# Patient Record
Sex: Male | Born: 1950 | Race: White | Hispanic: No | Marital: Married | Smoking: Never smoker
Health system: Southern US, Community
[De-identification: ages and names within clinical notes are randomized; demographics above are authoritative.]

## PROBLEM LIST (undated history)

## (undated) DIAGNOSIS — I499 Cardiac arrhythmia, unspecified: Secondary | ICD-10-CM

## (undated) DIAGNOSIS — E785 Hyperlipidemia, unspecified: Secondary | ICD-10-CM

## (undated) DIAGNOSIS — D369 Benign neoplasm, unspecified site: Secondary | ICD-10-CM

## (undated) DIAGNOSIS — E042 Nontoxic multinodular goiter: Secondary | ICD-10-CM

## (undated) DIAGNOSIS — K922 Gastrointestinal hemorrhage, unspecified: Secondary | ICD-10-CM

## (undated) DIAGNOSIS — E8881 Metabolic syndrome: Secondary | ICD-10-CM

## (undated) DIAGNOSIS — N400 Enlarged prostate without lower urinary tract symptoms: Secondary | ICD-10-CM

## (undated) DIAGNOSIS — I10 Essential (primary) hypertension: Secondary | ICD-10-CM

## (undated) HISTORY — DX: Benign neoplasm, unspecified site: D36.9

## (undated) HISTORY — DX: Hyperlipidemia, unspecified: E78.5

## (undated) HISTORY — PX: CATARACT EXTRACTION: SUR2

## (undated) HISTORY — DX: Metabolic syndrome: E88.810

## (undated) HISTORY — PX: EYE SURGERY: SHX253

## (undated) HISTORY — PX: KNEE CARTILAGE SURGERY: SHX688

## (undated) HISTORY — PX: THYROIDECTOMY: SHX17

## (undated) HISTORY — DX: Nontoxic multinodular goiter: E04.2

## (undated) HISTORY — DX: Metabolic syndrome: E88.81

## (undated) HISTORY — DX: Cardiac arrhythmia, unspecified: I49.9

## (undated) HISTORY — DX: Essential (primary) hypertension: I10

## (undated) HISTORY — DX: Gastrointestinal hemorrhage, unspecified: K92.2

## (undated) HISTORY — DX: Benign prostatic hyperplasia without lower urinary tract symptoms: N40.0

---

## 2003-03-29 ENCOUNTER — Ambulatory Visit (HOSPITAL_COMMUNITY): Admission: RE | Admit: 2003-03-29 | Discharge: 2003-03-29 | Payer: Self-pay | Admitting: Gastroenterology

## 2003-03-29 ENCOUNTER — Encounter (INDEPENDENT_AMBULATORY_CARE_PROVIDER_SITE_OTHER): Payer: Self-pay | Admitting: Specialist

## 2005-05-21 ENCOUNTER — Ambulatory Visit: Payer: Self-pay | Admitting: Cardiology

## 2005-05-29 ENCOUNTER — Ambulatory Visit: Payer: Self-pay

## 2005-08-14 ENCOUNTER — Encounter: Admission: RE | Admit: 2005-08-14 | Discharge: 2005-08-14 | Payer: Self-pay | Admitting: Family Medicine

## 2005-08-28 ENCOUNTER — Encounter: Admission: RE | Admit: 2005-08-28 | Discharge: 2005-08-28 | Payer: Self-pay | Admitting: General Surgery

## 2005-09-09 ENCOUNTER — Other Ambulatory Visit: Admission: RE | Admit: 2005-09-09 | Discharge: 2005-09-09 | Payer: Self-pay | Admitting: Interventional Radiology

## 2005-09-09 ENCOUNTER — Encounter: Admission: RE | Admit: 2005-09-09 | Discharge: 2005-09-09 | Payer: Self-pay | Admitting: General Surgery

## 2005-09-09 ENCOUNTER — Encounter (INDEPENDENT_AMBULATORY_CARE_PROVIDER_SITE_OTHER): Payer: Self-pay | Admitting: Specialist

## 2006-01-16 ENCOUNTER — Encounter: Admission: RE | Admit: 2006-01-16 | Discharge: 2006-01-16 | Payer: Self-pay | Admitting: General Surgery

## 2006-06-23 ENCOUNTER — Encounter: Admission: RE | Admit: 2006-06-23 | Discharge: 2006-06-23 | Payer: Self-pay | Admitting: General Surgery

## 2007-01-18 ENCOUNTER — Encounter: Admission: RE | Admit: 2007-01-18 | Discharge: 2007-01-18 | Payer: Self-pay | Admitting: General Surgery

## 2007-03-05 ENCOUNTER — Ambulatory Visit (HOSPITAL_COMMUNITY): Admission: RE | Admit: 2007-03-05 | Discharge: 2007-03-06 | Payer: Self-pay | Admitting: General Surgery

## 2007-03-05 ENCOUNTER — Encounter (INDEPENDENT_AMBULATORY_CARE_PROVIDER_SITE_OTHER): Payer: Self-pay | Admitting: General Surgery

## 2010-02-04 LAB — FECAL OCCULT BLOOD, GUAIAC: Fecal Occult Blood: NEGATIVE

## 2010-07-10 ENCOUNTER — Encounter: Payer: Self-pay | Admitting: Family Medicine

## 2010-07-10 DIAGNOSIS — S83206A Unspecified tear of unspecified meniscus, current injury, right knee, initial encounter: Secondary | ICD-10-CM | POA: Insufficient documentation

## 2010-08-27 NOTE — Op Note (Signed)
NAME:  Shawn Ochoa, Shawn Ochoa              ACCOUNT NO.:  [id number]   MEDICAL RECORD NO.:  [id number]          PATIENT TYPE:  AMB   LOCATION:  SDS                          FACILITY:  [facility]   PHYSICIAN:  Angelia Mould. Derrell Lolling, M.D.DATE OF BIRTH:  12-05-1950   DATE OF PROCEDURE:  03/05/2007  DATE OF DISCHARGE:                               OPERATIVE REPORT   PREOPERATIVE. DIAGNOSIS:  Multinodular goiter.   POSTOPERATIVE DIAGNOSIS:  Multinodular goiter.   OPERATION PERFORMED:  Total thyroidectomy.   SURGEON:  Angelia Mould. Derrell Lolling, M.D.   FIRST ASSISTANT:  Currie Paris, M.D.   OPERATIVE INDICATIONS:  This is a 60 year old white man who has had a  goiter for several years.  He has been worked up about 10 years ago, and  nothing further was done.  He has been on Synthroid in the past.  He has  no voice change or pressure symptoms but notices a large mass on the  left side.  He had an ultrasound last year which showed several nodules  within the thyroid gland bilaterally.  On the left, there was a 5.9-cm  nodule.  On the right, there was a nodular cystic component measuring  2.6.  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  He has been offered  thyroidectomy because of the large size of the left thyroid nodule.  He  has declined that for 18 months but it has been followed closely with  very little change.  He came back to see me last month stating that he  really wanted to go ahead and have this removed now.  He was brought to  the operating room electively.   OPERATIVE FINDINGS:  The left thyroid lobe was quite large.  It  contained a huge soft, smooth nodule in the mid to lower pole.  More  anteriorly and close to the isthmus was about a 2-cm very hard nodule.  The right lobe of the thyroid gland was mostly normal size and contained  smaller nodules.  I  did not feel any adenopathy anywhere.  On the right  side, I  questioned whether the patient had a nonrecurrent laryngeal  nerve.  We identified the inferior parathyroid glands on both sides.   OPERATIVE TECHNIQUE:  Following the induction of general endotracheal  anesthesia, the patient was placed in a reverse Trendelenburg position  with the neck extended.  The neck was prepped and draped in a sterile  fashion.  The patient was identified as to correct patient and correct  procedure.  Intravenous antibiotics were given.   A curved transverse incision was made about 2 cm above the suprasternal  notch.  Dissection was carried down through the subcutaneous tissue and  platysma muscle.  Skin and platysma flaps were raised superiorly and  inferiorly, and a self-retaining retractor was placed.  The strap  muscles were divided in the midline.  On the lower flap, one vein had to  be isolated, clamped, divided, and ligated with  3-0 silk ties.   We exposed the left thyroid lobe first and dissected the strap muscles  off of it.  In general, we kept the dissection in the capsule of the  thyroid gland to avoid any injury to nerves or parathyroid glands.  We  mobilized the lower pole by dividing vascular structures with Harmonic  scalpel and baby clips.  We mobilized the superior pole in two separate  sections by isolating the superior pole vessels, ligating them in  continuity, and then placing a metal clip above the superior tie for  extra security and then dividing this.  This mobilized the superior  pole, and we were able to bring it down off of the upper trachea without  much difficulty.  We then dissected from lateral to medial, very  carefully staying in the capsule of the thyroid gland.  It was a little  sticky in this area, but we were able to easily stay in the capsule and  stay away from the tracheoesophageal groove and stay away from the  recurrent laryngeal nerve.  We easily mobilized  this thyroid lobe up  onto the anterior aspect of the trachea.   I then exposed the right thyroid lobe in a similar fashion.  It was much  smaller and there was less vascular attachments, but they were also  controlled either with Harmonic scalpel or metal clips.  We took the  superior pole down in similar fashion, ligating the superior pole  vessels in continuity with 2-0 silk ties and also an extra metal clip  superiorly for security and dividing this.  We then carefully mobilized  the right lobe from lateral to medial.  We saw one structure that looked  like a small nerve coming down toward the junction between the thyroid  cartilage and the trachea, although this was smaller than the laryngeal  nerve, we were very careful to avoid injury to it in case it was a  nonrecurrent laryngeal nerve.  We mobilized the rest of the thyroid  gland off the trachea.  We marked the left superior pole with a suture.  We felt that we had clearly seen the inferior parathyroid glands on both  sides, and although we did not clearly see the parathyroid gland  superiorly, we felt that we stayed so close to the capsule that we most  likely did not injure them.   The specimen was passed off the field.  The wounds were irrigated with  saline.  We had a couple of small areas of bleeding that were controlled  with small metal clips or cautery.  We then irrigated this area and put  the Surgicel gauze on both sides and waited for about 5 minutes and then  noticed that the wound was completely dry without any blood.  We left  the Surgicel in place.  The strap muscles were closed with interrupted  sutures of 3-0 Vicryl.  The platysma muscle was closed with interrupted  sutures of 3-0 Vicryl and the skin closed with a running subcuticular  suture of 4-0 Monocryl and Steri-Strips.  Clean bandages were placed,  and the patient was taken to the recovery room in stable condition.   ESTIMATED BLOOD LOSS:  About 30 mL  or less.   COMPLICATIONS:  None.   COUNTS:  Sponge and instrument counts were correct.      Angelia Mould. Derrell Lolling, M.D.  Electronically Signed     HMI/MEDQ  D:  03/05/2007  T:  03/05/2007  Job:  [id number]   cc:   Ernestina Penna, M.D.

## 2010-08-30 NOTE — Op Note (Signed)
NAME:  Shawn Ochoa, Shawn Ochoa                   ACCOUNT NO.:  [id number]   MEDICAL RECORD NO.:  [id number]                   PATIENT TYPE:  AMB   LOCATION:  ENDO                                 FACILITY:  [facility]   PHYSICIAN:  John C. Madilyn Fireman, M.D.                 DATE OF BIRTH:  1950-07-24   DATE OF PROCEDURE:  03/29/2003  DATE OF DISCHARGE:                                 OPERATIVE REPORT   PROCEDURE:  Colonoscopy.   INDICATIONS FOR PROCEDURE:  Colon cancer screening in two second-degree  relatives.   DESCRIPTION OF PROCEDURE:  The patient was placed in the left lateral  decubitus position and placed on the pulse monitor with continuous low-flow  oxygen delivered by nasal cannula.  He was sedated with 75 mcg IV fentanyl  and 6 mg IV Versed.  The Olympus video colonoscope was inserted into the  rectum and advanced to the cecum, confirmed by transillumination of  McBurney's point and visualization of the ileocecal valve and appendiceal  orifice.  Prep was excellent.  The cecum appeared normal with no masses,  polyps, diverticula, or other mucosal abnormalities.  In the ascending  colon, there was an 8 mm sessile polyp which was fulgurated by snare.  The  remainder of the ascending, transverse, descending, sigmoid, and rectum  appeared normal with no further polyps, masses, diverticula, or other  mucosal abnormalities.  The rectum likewise appeared normal and retroflexed  view of the anus revealed no obvious internal hemorrhoids.  The colonoscope  was then withdrawn and the patient returned to the recovery room in stable  condition.  He tolerated the procedure well and there were no immediate  complications.   IMPRESSION:  Ascending colon polyp.   PLAN:  Await histology to determine method and interval for future colon  screening.                                               John C. Madilyn Fireman, M.D.    JCH/MEDQ  D:  03/29/2003  T:  03/29/2003  Job:  [id number]   cc:   Ernestina Penna,  M.D.  [address] [place]  [place] [address]  Fax: [phone]

## 2011-01-21 LAB — CBC
HCT: 41.5
MCHC: 33.9
MCV: 92.9
Platelets: 316
RDW: 13.1

## 2011-01-21 LAB — DIFFERENTIAL
Basophils Absolute: 0
Lymphocytes Relative: 36
Monocytes Absolute: 0.5
Monocytes Relative: 12
Neutro Abs: 1.9

## 2011-01-21 LAB — URINALYSIS, ROUTINE W REFLEX MICROSCOPIC
Bilirubin Urine: NEGATIVE
Glucose, UA: NEGATIVE
Hgb urine dipstick: NEGATIVE
Ketones, ur: NEGATIVE
Nitrite: NEGATIVE
Protein, ur: NEGATIVE
Specific Gravity, Urine: 1.021
Urobilinogen, UA: 1
pH: 7

## 2011-01-21 LAB — CALCIUM
Calcium: 8.8
Calcium: 8.9

## 2011-01-21 LAB — COMPREHENSIVE METABOLIC PANEL
Albumin: 3.5
BUN: 15
Calcium: 8.9
Creatinine, Ser: 0.93
Total Bilirubin: 1.3 — ABNORMAL HIGH
Total Protein: 6.9

## 2012-07-08 ENCOUNTER — Other Ambulatory Visit: Payer: Self-pay | Admitting: Family Medicine

## 2012-07-10 ENCOUNTER — Other Ambulatory Visit: Payer: Self-pay | Admitting: Family Medicine

## 2012-07-12 ENCOUNTER — Other Ambulatory Visit: Payer: Self-pay | Admitting: *Deleted

## 2012-07-12 MED ORDER — LEVOTHYROXINE SODIUM 75 MCG PO TABS: 75.0000 ug | ORAL_TABLET | Freq: Every day | ORAL | Status: DC

## 2012-07-12 MED ORDER — PIOGLITAZONE HCL 30 MG PO TABS: 30.0000 mg | ORAL_TABLET | Freq: Every day | ORAL | Status: DC

## 2012-07-12 MED ORDER — BENAZEPRIL HCL 20 MG PO TABS: 20.0000 mg | ORAL_TABLET | Freq: Every day | ORAL | Status: DC

## 2012-09-07 ENCOUNTER — Other Ambulatory Visit: Payer: Self-pay | Admitting: Family Medicine

## 2012-12-06 ENCOUNTER — Ambulatory Visit (INDEPENDENT_AMBULATORY_CARE_PROVIDER_SITE_OTHER): Payer: BC Managed Care – PPO | Admitting: Family Medicine

## 2012-12-06 ENCOUNTER — Encounter: Payer: Self-pay | Admitting: Family Medicine

## 2012-12-06 ENCOUNTER — Other Ambulatory Visit: Payer: Self-pay | Admitting: Family Medicine

## 2012-12-06 VITALS — BP 115/73 | HR 71 | Temp 96.8°F | Ht 70.0 in | Wt 214.0 lb

## 2012-12-06 DIAGNOSIS — E8881 Metabolic syndrome: Secondary | ICD-10-CM

## 2012-12-06 DIAGNOSIS — E785 Hyperlipidemia, unspecified: Secondary | ICD-10-CM

## 2012-12-06 DIAGNOSIS — E039 Hypothyroidism, unspecified: Secondary | ICD-10-CM | POA: Insufficient documentation

## 2012-12-06 DIAGNOSIS — R7309 Other abnormal glucose: Secondary | ICD-10-CM

## 2012-12-06 DIAGNOSIS — R739 Hyperglycemia, unspecified: Secondary | ICD-10-CM

## 2012-12-06 DIAGNOSIS — E559 Vitamin D deficiency, unspecified: Secondary | ICD-10-CM

## 2012-12-06 DIAGNOSIS — I10 Essential (primary) hypertension: Secondary | ICD-10-CM | POA: Insufficient documentation

## 2012-12-06 LAB — POCT CBC
Lymph, poc: 1.1 (ref 0.6–3.4)
MCH, POC: 30.5 pg (ref 27–31.2)
MCV: 90.6 fL (ref 80–97)
POC LYMPH PERCENT: 27.5 %L (ref 10–50)
Platelet Count, POC: 247 10*3/uL (ref 142–424)
RDW, POC: 13.6 %
WBC: 4.1 10*3/uL — AB (ref 4.6–10.2)

## 2012-12-06 LAB — POCT GLYCOSYLATED HEMOGLOBIN (HGB A1C): Hemoglobin A1C: 5.5

## 2012-12-06 NOTE — Patient Instructions (Addendum)
Fall precautions discussed Continue current meds and therapeutic lifestyle changes Don't forget to get your flu shot in October Continue regular exercise Watch sodium intake

## 2012-12-06 NOTE — Progress Notes (Signed)
Subjective:    Patient ID: Shawn Ochoa, male    DOB: [birth year], 62 y.o.   MRN: [id number]  HPI Patient returns to clinic today for followup and management of chronic medical problems. These include hypertension, hyperlipidemia, hypothyroidism, and in the past elevated blood sugar. Patient admits that he has not been as active physically due to personal circumstances at home with caring for his mother and with his working.  Also see the review of systems.   Review of Systems  Constitutional: Positive for fatigue (slight). Negative for activity change and appetite change.  HENT: Negative for ear pain, congestion, sore throat, sneezing, postnasal drip and sinus pressure.   Eyes: Positive for discharge (watering). Negative for redness, itching and visual disturbance.  Respiratory: Negative.  Negative for cough, choking, chest tightness, shortness of breath and wheezing.   Cardiovascular: Positive for leg swelling (occasional). Negative for chest pain and palpitations.  Gastrointestinal: Negative for nausea, vomiting, abdominal pain, diarrhea, constipation and blood in stool.  Endocrine: Negative.  Negative for cold intolerance, heat intolerance, polydipsia, polyphagia and polyuria.  Genitourinary: Positive for frequency. Negative for dysuria, urgency and hematuria.  Musculoskeletal: Positive for back pain (intermitent stiffness LB). Negative for myalgias and arthralgias.  Skin: Negative.  Negative for color change, pallor, rash and wound.  Allergic/Immunologic: Positive for environmental allergies (seasonal).  Neurological: Negative for dizziness, tremors, weakness, light-headedness, numbness and headaches.  Hematological: Negative.   Psychiatric/Behavioral: Positive for sleep disturbance and decreased concentration (slight short term memory deficit). Negative for agitation.       Objective:   Physical Exam BP 115/73  Pulse 71  Temp(Src) 96.8 F (36 C) (Oral)  Ht 5\' 10"  (1.778  m)  Wt 214 lb (97.07 kg)  BMI 30.71 kg/m2  The patient appeared well nourished and normally developed, alert and oriented to time and place. Speech, behavior and judgement appear normal. Vital signs as documented.  Head exam is unremarkable. No scleral icterus or pallor noted. There is nasal congestion bilaterally. Mouth and throat are normal. Ear canals were clear. There appears to be some puffiness around his eyes. Neck is without jugular venous distension, thyromegally, or carotid bruits. Carotid upstrokes are brisk bilaterally. No cervical adenopathy. Lungs are clear anteriorly and posteriorly to auscultation. Normal respiratory effort. There were no wheezes or congestion with coughing Cardiac exam reveals regular rate and rhythm at 72 per minute.. First and second heart sounds normal.  No murmurs, rubs or gallops.  Abdominal exam reveals normal bowl sounds, no masses, no organomegaly and no aortic enlargement. No inguinal adenopathy. There is no abdominal tenderness.  Extremities are nonedematous and both femoral and pedal pulses are normal. Sensation was intact in both feet. Skin is normal in both feet. Skin without pallor or jaundice.  Warm and dry, without rash. Neurologic exam reveals normal deep tendon reflexes and normal sensation.          Assessment & Plan:  1. Hypertension - POCT CBC; Standing - BMP8+EGFR; Standing  2. Hyperlipemia - Hepatic function panel; Standing - NMR, lipoprofile; Standing  3. Hyperglycemia - POCT glycosylated hemoglobin (Hb A1C)  4. Vitamin D deficiency - Vit D  25 hydroxy (rtn osteoporosis monitoring); Standing  5. Hyperlipidemia  6. Hypothyroidism  7. Metabolic syndrome  8.Allergic Rhinitis  Patient Instructions  Fall precautions discussed Continue current meds and therapeutic lifestyle changes Don't forget to get your flu shot in October Continue regular exercise Watch sodium intake   Try Nasacort over-the-counter, 2 sprays  each nostril nightly for  4 weeks then reduce to one spray each nostril  Nyra Capes MD

## 2012-12-06 NOTE — Addendum Note (Signed)
Addended by: Prescott Gum on: 12/06/2012 09:55 AM   Modules accepted: Orders

## 2012-12-08 LAB — NMR, LIPOPROFILE
Cholesterol: 131 mg/dL (ref <200)
HDL Particle Number: 32 umol/L (ref >=30.5)
LDL Particle Number: 675 nmol/L (ref <1000)
LDL Size: 21.6 nm (ref >20.5)
Triglycerides by NMR: 58 mg/dL (ref <150)

## 2012-12-08 LAB — HEPATIC FUNCTION PANEL
Albumin: 3.8 g/dL (ref 3.6–4.8)
Bilirubin, Direct: 0.2 mg/dL (ref 0.00–0.40)
Total Bilirubin: 0.5 mg/dL (ref 0.0–1.2)
Total Protein: 6.2 g/dL (ref 6.0–8.5)

## 2012-12-08 LAB — BMP8+EGFR
BUN/Creatinine Ratio: 16 (ref 10–22)
CO2: 26 mmol/L (ref 18–29)
Chloride: 104 mmol/L (ref 97–108)
GFR calc Af Amer: 78 mL/min/{1.73_m2} (ref >59)
Sodium: 142 mmol/L (ref 134–144)

## 2012-12-15 ENCOUNTER — Other Ambulatory Visit: Payer: Self-pay | Admitting: Family Medicine

## 2013-01-10 ENCOUNTER — Telehealth: Payer: Self-pay | Admitting: *Deleted

## 2013-01-10 NOTE — Telephone Encounter (Signed)
Message copied by Baltazar Apo on Mon Jan 10, 2013  8:15 AM ------      Message from: Ernestina Penna      Created: Wed Dec 08, 2012 10:42 AM       The BMP including blood sugar kidney function test and electrolytes were all within normal limits.      The advanced lipid panel was excellent. Continue current cholesterol treatment and aggressive therapeutic lifestyle changes.       All liver function tests were within normal limits.      The vitamin D level was within normal limits. Continue current treatment ------

## 2013-02-17 ENCOUNTER — Other Ambulatory Visit: Payer: Self-pay

## 2013-03-05 ENCOUNTER — Other Ambulatory Visit: Payer: Self-pay | Admitting: Family Medicine

## 2013-03-07 NOTE — Telephone Encounter (Signed)
Last seen and last glucose 12/06/12  [facility]  Pharmacy requesting 90 day supply

## 2013-05-09 ENCOUNTER — Encounter: Payer: Self-pay | Admitting: Family Medicine

## 2013-05-09 ENCOUNTER — Ambulatory Visit (INDEPENDENT_AMBULATORY_CARE_PROVIDER_SITE_OTHER): Payer: BC Managed Care – PPO | Admitting: Family Medicine

## 2013-05-09 ENCOUNTER — Ambulatory Visit (INDEPENDENT_AMBULATORY_CARE_PROVIDER_SITE_OTHER): Payer: BC Managed Care – PPO

## 2013-05-09 VITALS — BP 119/80 | HR 67 | Temp 97.6°F | Ht 70.0 in | Wt 214.0 lb

## 2013-05-09 DIAGNOSIS — E785 Hyperlipidemia, unspecified: Secondary | ICD-10-CM

## 2013-05-09 DIAGNOSIS — E039 Hypothyroidism, unspecified: Secondary | ICD-10-CM

## 2013-05-09 DIAGNOSIS — I10 Essential (primary) hypertension: Secondary | ICD-10-CM

## 2013-05-09 DIAGNOSIS — B070 Plantar wart: Secondary | ICD-10-CM

## 2013-05-09 DIAGNOSIS — E559 Vitamin D deficiency, unspecified: Secondary | ICD-10-CM

## 2013-05-09 DIAGNOSIS — E8881 Metabolic syndrome: Secondary | ICD-10-CM

## 2013-05-09 LAB — POCT CBC
Granulocyte percent: 61.8 %G (ref 37–80)
HEMATOCRIT: 39.5 % — AB (ref 43.5–53.7)
Hemoglobin: 12.6 g/dL — AB (ref 14.1–18.1)
Lymph, poc: 1.2 (ref 0.6–3.4)
MCH: 29.5 pg (ref 27–31.2)
MCHC: 31.9 g/dL (ref 31.8–35.4)
MCV: 92.4 fL (ref 80–97)
MPV: 6.6 fL (ref 0–99.8)
POC GRANULOCYTE: 2.5 (ref 2–6.9)
POC LYMPH PERCENT: 30.9 %L (ref 10–50)
Platelet Count, POC: 255 10*3/uL (ref 142–424)
RBC: 4.3 M/uL — AB (ref 4.69–6.13)
RDW, POC: 13.5 %
WBC: 4 10*3/uL — AB (ref 4.6–10.2)

## 2013-05-09 LAB — POCT GLYCOSYLATED HEMOGLOBIN (HGB A1C): HEMOGLOBIN A1C: 5.3

## 2013-05-09 MED ORDER — PIOGLITAZONE HCL 30 MG PO TABS: ORAL_TABLET | ORAL | Status: DC

## 2013-05-09 MED ORDER — BENAZEPRIL HCL 20 MG PO TABS: 20.0000 mg | ORAL_TABLET | Freq: Every day | ORAL | Status: DC

## 2013-05-09 MED ORDER — LEVOTHYROXINE SODIUM 75 MCG PO TABS: 75.0000 ug | ORAL_TABLET | Freq: Every day | ORAL | Status: DC

## 2013-05-09 MED ORDER — ATORVASTATIN CALCIUM 40 MG PO TABS: 40.0000 mg | ORAL_TABLET | Freq: Every day | ORAL | Status: DC

## 2013-05-09 NOTE — Patient Instructions (Addendum)
Continue current medications. Continue good therapeutic lifestyle changes which include good diet and exercise. Fall precautions discussed with patient. Schedule your flu vaccine if you haven't had it yet If you are over 65 years old - you may need Prevnar 48 or the adult Pneumonia vaccine. Use an Estate manager/land agent for the callus on your foot, Polysporin ointment Check your insurance for the Prevnar vaccine Use a cool mist humidifier in her bedroom at nighttime and saline nose spray as needed for head congestion

## 2013-05-09 NOTE — Progress Notes (Addendum)
Subjective:    Patient ID: Shawn Ochoa, male    DOB: [birth year], 63 y.o.   MRN: [id number]  HPI Pt here for follow up and management of chronic medical problems. Patient complains of a callus/wart on the plantar surface of his right foot that he has been using over-the-counter medication for.        Patient Active Problem List   Diagnosis Date Noted  . Hypertension 12/06/2012  . Hyperlipidemia 12/06/2012  . Hypothyroidism 12/06/2012  . Metabolic syndrome 26/83/4196  . Knee cartilage, torn, right    Outpatient Encounter Prescriptions as of 05/09/2013  Medication Sig  . aspirin 81 MG EC tablet Take 81 mg by mouth daily.    Marland Kitchen atorvastatin (LIPITOR) 40 MG tablet Take 40 mg by mouth daily. As directed  . benazepril (LOTENSIN) 20 MG tablet Take 1 tablet (20 mg total) by mouth daily.  . Calcium Carbonate-Vitamin D (CALCIUM + D PO) Take 1 capsule by mouth daily.    . Cholecalciferol (VITAMIN D3) 5000 UNITS CAPS Take 1 capsule by mouth daily.    Marland Kitchen levothyroxine (SYNTHROID, LEVOTHROID) 75 MCG tablet Take 1 tablet (75 mcg total) by mouth daily.  . Omega-3 Fatty Acids (FISH OIL) 1000 MG CAPS Take 1 capsule by mouth 2 (two) times daily.    . pioglitazone (ACTOS) 30 MG tablet TAKE 1 TABLET (30 MG TOTAL) BY MOUTH DAILY.  . [DISCONTINUED] atorvastatin (LIPITOR) 20 MG tablet Take 20 mg by mouth daily.   . [DISCONTINUED] benazepril (LOTENSIN) 20 MG tablet TAKE 1 TABLET (20 MG TOTAL) BY MOUTH DAILY.    Review of Systems  Constitutional: Negative.   HENT: Negative.   Eyes: Negative.   Respiratory: Negative.   Cardiovascular: Negative.   Gastrointestinal: Negative.   Endocrine: Negative.   Genitourinary: Negative.   Musculoskeletal: Negative.   Skin: Negative.        Hard area on bottom of foot  Allergic/Immunologic: Negative.   Neurological: Negative.   Hematological: Negative.   Psychiatric/Behavioral: Negative.        Objective:   Physical Exam  Nursing note and vitals  reviewed. Constitutional: He is oriented to person, place, and time. He appears well-developed and well-nourished. No distress.  HENT:  Head: Normocephalic and atraumatic.  Right Ear: External ear normal.  Left Ear: External ear normal.  Mouth/Throat: Oropharynx is clear and moist. No oropharyngeal exudate.  Nasal congestion bilaterally  Eyes: Conjunctivae and EOM are normal. Pupils are equal, round, and reactive to light. Right eye exhibits no discharge. Left eye exhibits no discharge. No scleral icterus.  Neck: Normal range of motion. Neck supple. No thyromegaly present.  No carotid bruits  Cardiovascular: Normal rate, regular rhythm, normal heart sounds and intact distal pulses.  Exam reveals no gallop and no friction rub.   No murmur heard. At 84 per minute  Pulmonary/Chest: Effort normal and breath sounds normal. No respiratory distress. He has no wheezes. He has no rales. He exhibits no tenderness.  No axillary adenopathy  Abdominal: Soft. Bowel sounds are normal. He exhibits no mass. There is no tenderness. There is no rebound and no guarding.  No inguinal adenopathy  Musculoskeletal: Normal range of motion. He exhibits no edema and no tenderness.  Callus plantar surface of right foot below the fourth metatarsal. This was parred down with a #15 blade.  Lymphadenopathy:    He has no cervical adenopathy.  Neurological: He is alert and oriented to person, place, and time. He has normal reflexes. No cranial  nerve deficit.  Skin: Skin is warm and dry. No rash noted. No erythema. No pallor.  Callus as indicated above with a center wart  Psychiatric: He has a normal mood and affect. His behavior is normal. Judgment and thought content normal.   BP 119/80  Pulse 67  Temp(Src) 97.6 F (36.4 C) (Oral)  Ht '5\' 10"'  (1.778 m)  Wt 214 lb (97.07 kg)  BMI 30.71 kg/m2  Results for orders placed in visit on 05/09/13  POCT CBC      Result Value Range   WBC 4.0 (*) 4.6 - 10.2 K/uL   Lymph,  poc 1.2  0.6 - 3.4   POC LYMPH PERCENT 30.9  10 - 50 %L   POC Granulocyte 2.5  2 - 6.9   Granulocyte percent 61.8  37 - 80 %G   RBC 4.3 (*) 4.69 - 6.13 M/uL   Hemoglobin 12.6 (*) 14.1 - 18.1 g/dL   HCT, POC 39.5 (*) 43.5 - 53.7 %   MCV 92.4  80 - 97 fL   MCH, POC 29.5  27 - 31.2 pg   MCHC 31.9  31.8 - 35.4 g/dL   RDW, POC 13.5     Platelet Count, POC 255.0  142 - 424 K/uL   MPV 6.6  0 - 99.8 fL  POCT GLYCOSYLATED HEMOGLOBIN (HGB A1C)      Result Value Range   Hemoglobin A1C 5.3     [facility] reading (PRIMARY) by  Dr. Brunilda Payor x-ray-no active disease                                        Assessment & Plan:  1. Hypertension - POCT CBC - BMP8+EGFR - Hepatic function panel - DG Chest 2 View; Future  2. Hypothyroidism - POCT CBC - Thyroid Panel With TSH  3. Hyperlipidemia - POCT CBC - NMR, lipoprofile  4. Metabolic syndrome - POCT CBC - POCT glycosylated hemoglobin (Hb A1C)  5. Vitamin D deficiency - Vit D  25 hydroxy (rtn osteoporosis monitoring)  6. Plantar wart  Meds ordered this encounter  Medications  . atorvastatin (LIPITOR) 40 MG tablet    Sig: Take 40 mg by mouth daily. As directed   Patient Instructions  Continue current medications. Continue good therapeutic lifestyle changes which include good diet and exercise. Fall precautions discussed with patient. Schedule your flu vaccine if you haven't had it yet If you are over 15 years old - you may need Prevnar 52 or the adult Pneumonia vaccine. Use an Estate manager/land agent for the callus on your foot, Polysporin ointment Check your insurance for the Prevnar vaccine Use a cool mist humidifier in her bedroom at nighttime and saline nose spray as needed for head congestion   Arrie Senate MD

## 2013-05-10 LAB — VITAMIN D 25 HYDROXY (VIT D DEFICIENCY, FRACTURES): Vit D, 25-Hydroxy: 33.6 ng/mL (ref 30.0–100.0)

## 2013-05-10 LAB — NMR, LIPOPROFILE
Cholesterol: 132 mg/dL (ref <200)
HDL Cholesterol by NMR: 57 mg/dL (ref >=40)
HDL Particle Number: 31.1 umol/L (ref >=30.5)
LDL Particle Number: 692 nmol/L (ref <1000)
LDL SIZE: 21.5 nm (ref >20.5)
LDLC SERPL CALC-MCNC: 63 mg/dL (ref <100)
SMALL LDL PARTICLE NUMBER: 205 nmol/L (ref <=527)
TRIGLYCERIDES BY NMR: 62 mg/dL (ref <150)

## 2013-05-10 LAB — HEPATIC FUNCTION PANEL
ALK PHOS: 59 IU/L (ref 39–117)
ALT: 26 IU/L (ref 0–44)
AST: 25 IU/L (ref 0–40)
Albumin: 3.7 g/dL (ref 3.6–4.8)
BILIRUBIN DIRECT: 0.19 mg/dL (ref 0.00–0.40)
BILIRUBIN TOTAL: 0.6 mg/dL (ref 0.0–1.2)
Total Protein: 6.2 g/dL (ref 6.0–8.5)

## 2013-05-10 LAB — BMP8+EGFR
BUN / CREAT RATIO: 19 (ref 10–22)
BUN: 23 mg/dL (ref 8–27)
CALCIUM: 9 mg/dL (ref 8.6–10.2)
CO2: 24 mmol/L (ref 18–29)
CREATININE: 1.19 mg/dL (ref 0.76–1.27)
Chloride: 106 mmol/L (ref 97–108)
GFR, EST AFRICAN AMERICAN: 74 mL/min/{1.73_m2} (ref >59)
GFR, EST NON AFRICAN AMERICAN: 64 mL/min/{1.73_m2} (ref >59)
Glucose: 94 mg/dL (ref 65–99)
POTASSIUM: 4.4 mmol/L (ref 3.5–5.2)
SODIUM: 143 mmol/L (ref 134–144)

## 2013-05-10 LAB — THYROID PANEL WITH TSH
Free Thyroxine Index: 3 (ref 1.2–4.9)
T3 Uptake Ratio: 35 % (ref 24–39)
T4, Total: 8.6 ug/dL (ref 4.5–12.0)
TSH: 1.35 u[IU]/mL (ref 0.450–4.500)

## 2013-05-11 ENCOUNTER — Telehealth: Payer: Self-pay

## 2013-05-11 NOTE — Telephone Encounter (Signed)
[facility] on wife's cell phone; Only valid phone number on file is home number that does not have an AM. Cell and work numbers are not correct

## 2013-05-11 NOTE — Telephone Encounter (Signed)
Message copied by Koren Bound on Wed May 11, 2013 10:52 AM ------      Message from: Chipper Herb      Created: Mon May 09, 2013 10:11 AM       As per radiology report ------

## 2013-05-11 NOTE — Telephone Encounter (Signed)
[place] on cell pone; No AM on home phone

## 2013-05-11 NOTE — Telephone Encounter (Signed)
Message copied by Koren Bound on Wed May 11, 2013  8:52 AM ------      Message from: Chipper Herb      Created: Mon May 09, 2013 10:11 AM       As per radiology report ------

## 2013-07-01 ENCOUNTER — Other Ambulatory Visit: Payer: Self-pay | Admitting: Physician Assistant

## 2013-11-07 ENCOUNTER — Encounter: Payer: Self-pay | Admitting: Family Medicine

## 2013-11-07 ENCOUNTER — Ambulatory Visit (INDEPENDENT_AMBULATORY_CARE_PROVIDER_SITE_OTHER): Payer: BC Managed Care – PPO | Admitting: Family Medicine

## 2013-11-07 VITALS — BP 102/66 | HR 66 | Temp 97.1°F | Ht 70.0 in | Wt 207.0 lb

## 2013-11-07 DIAGNOSIS — E8881 Metabolic syndrome: Secondary | ICD-10-CM

## 2013-11-07 DIAGNOSIS — E785 Hyperlipidemia, unspecified: Secondary | ICD-10-CM

## 2013-11-07 DIAGNOSIS — Z0000 Encounter for general adult medical examination without abnormal findings: Secondary | ICD-10-CM

## 2013-11-07 DIAGNOSIS — N400 Enlarged prostate without lower urinary tract symptoms: Secondary | ICD-10-CM

## 2013-11-07 DIAGNOSIS — R972 Elevated prostate specific antigen [PSA]: Secondary | ICD-10-CM

## 2013-11-07 DIAGNOSIS — I10 Essential (primary) hypertension: Secondary | ICD-10-CM

## 2013-11-07 DIAGNOSIS — E559 Vitamin D deficiency, unspecified: Secondary | ICD-10-CM

## 2013-11-07 DIAGNOSIS — E039 Hypothyroidism, unspecified: Secondary | ICD-10-CM

## 2013-11-07 LAB — POCT UA - MICROSCOPIC ONLY
CASTS, UR, LPF, POC: NEGATIVE
CRYSTALS, UR, HPF, POC: NEGATIVE
Mucus, UA: NEGATIVE
WBC, Ur, HPF, POC: NEGATIVE
Yeast, UA: NEGATIVE

## 2013-11-07 LAB — POCT URINALYSIS DIPSTICK
Bilirubin, UA: NEGATIVE
GLUCOSE UA: NEGATIVE
Ketones, UA: NEGATIVE
Leukocytes, UA: NEGATIVE
NITRITE UA: NEGATIVE
PH UA: 6
PROTEIN UA: NEGATIVE
SPEC GRAV UA: 1.01
Urobilinogen, UA: NEGATIVE

## 2013-11-07 LAB — POCT CBC
GRANULOCYTE PERCENT: 59.9 % (ref 37–80)
HCT, POC: 39.9 % — AB (ref 43.5–53.7)
Hemoglobin: 13.1 g/dL — AB (ref 14.1–18.1)
Lymph, poc: 1.2 (ref 0.6–3.4)
MCH, POC: 30.3 pg (ref 27–31.2)
MCHC: 32.8 g/dL (ref 31.8–35.4)
MCV: 92.4 fL (ref 80–97)
MPV: 7.2 fL (ref 0–99.8)
POC GRANULOCYTE: 2.1 (ref 2–6.9)
POC LYMPH PERCENT: 33.5 %L (ref 10–50)
Platelet Count, POC: 256 10*3/uL (ref 142–424)
RBC: 4.3 M/uL — AB (ref 4.69–6.13)
RDW, POC: 13.5 %
WBC: 3.5 10*3/uL — AB (ref 4.6–10.2)

## 2013-11-07 LAB — POCT GLYCOSYLATED HEMOGLOBIN (HGB A1C)

## 2013-11-07 MED ORDER — ATORVASTATIN CALCIUM 40 MG PO TABS: 40.0000 mg | ORAL_TABLET | Freq: Every day | ORAL | Status: DC

## 2013-11-07 MED ORDER — BENAZEPRIL HCL 20 MG PO TABS: 20.0000 mg | ORAL_TABLET | Freq: Every day | ORAL | Status: DC

## 2013-11-07 MED ORDER — LEVOTHYROXINE SODIUM 75 MCG PO TABS: 75.0000 ug | ORAL_TABLET | Freq: Every day | ORAL | Status: DC

## 2013-11-07 MED ORDER — PIOGLITAZONE HCL 30 MG PO TABS: ORAL_TABLET | ORAL | Status: DC

## 2013-11-07 NOTE — Patient Instructions (Addendum)
Continue current medications. Continue good therapeutic lifestyle changes which include good diet and exercise. Fall precautions discussed with patient. If an FOBT was given today- please return it to our front desk. If you are over 65 years old - you may need Prevnar 92 or the adult Pneumonia vaccine. If the gastroenterologist does not call you by September for your colonoscopy, please call us and we will call him and arrange this for you We will also arrange for you to have a stress test Use  Cortisone 10 for rash on leg sparingly twice daily Continue aggressive therapeutic lifestyle changes                       Medicare Annual Wellness Visit  [place] and the medical providers at [facility] strive to bring you the best medical care.  In doing so we not only want to address your current medical conditions and concerns but also to detect new conditions early and prevent illness, disease and health-related problems.    Medicare offers a yearly Wellness Visit which allows our clinical staff to assess your need for preventative services including immunizations, lifestyle education, counseling to decrease risk of preventable diseases and screening for fall risk and other medical concerns.    This visit is provided free of charge (no copay) for all Medicare recipients. The clinical pharmacists at [facility] have begun to conduct these Wellness Visits which will also include a thorough review of all your medications.    As you primary medical provider recommend that you make an appointment for your Annual Wellness Visit if you have not done so already this year.  You may set up this appointment before you leave today or you may call back ([phone]) and schedule an appointment.  Please make sure when you call that you mention that you are scheduling your Annual Wellness Visit with the clinical pharmacist so that the appointment may be made for the  proper length of time.

## 2013-11-07 NOTE — Progress Notes (Signed)
Subjective:    Patient ID: Shawn Ochoa, male    DOB: 1950-11-21, 63 y.o.   MRN: [id number]  HPI Patient is here today for annual wellness exam and follow up of chronic medical problems. Patient is doing well with no particular complaints. He does have some nocturia. He has had cataracts removed from both eyes and may be in need of bifocals. He is due to receive a Prevnar but will wait until after he turns 89. His BMI is 29. He is due to get lab work, return and FOBT and he is awaiting hearing for a scheduled visit for a colonoscopy. All of his refills will be done.          Patient Active Problem List   Diagnosis Date Noted  . Hypertension 12/06/2012  . Hyperlipidemia 12/06/2012  . Hypothyroidism 12/06/2012  . Metabolic syndrome 88/91/6945  . Knee cartilage, torn, right    Outpatient Encounter Prescriptions as of 11/07/2013  Medication Sig  . aspirin 81 MG EC tablet Take 81 mg by mouth daily.    Marland Kitchen atorvastatin (LIPITOR) 40 MG tablet Take 1 tablet (40 mg total) by mouth daily. As directed  . benazepril (LOTENSIN) 20 MG tablet Take 1 tablet (20 mg total) by mouth daily.  . Calcium Carbonate-Vitamin D (CALCIUM + D PO) Take 1 capsule by mouth daily.    . Cholecalciferol (VITAMIN D3) 5000 UNITS CAPS Take 1 capsule by mouth daily.    Marland Kitchen levothyroxine (SYNTHROID, LEVOTHROID) 75 MCG tablet Take 1 tablet (75 mcg total) by mouth daily.  . Omega-3 Fatty Acids (FISH OIL) 1000 MG CAPS Take 1 capsule by mouth 2 (two) times daily.    . pioglitazone (ACTOS) 30 MG tablet TAKE 1 TABLET (30 MG TOTAL) BY MOUTH DAILY.    Review of Systems  Constitutional: Negative.   HENT: Negative.   Eyes: Negative.   Respiratory: Negative.   Cardiovascular: Negative.   Gastrointestinal: Negative.   Endocrine: Negative.   Genitourinary: Negative.   Musculoskeletal: Negative.   Skin: Negative.   Allergic/Immunologic: Negative.   Neurological: Negative.   Hematological: Negative.     Psychiatric/Behavioral: Negative.        Objective:   Physical Exam  Nursing note and vitals reviewed. Constitutional: He is oriented to person, place, and time. He appears well-developed and well-nourished. No distress.  The patient is pleasant and cooperative and he notes that through exercise he has lost several pounds of weight.  HENT:  Head: Normocephalic and atraumatic.  Right Ear: External ear normal.  Left Ear: External ear normal.  Mouth/Throat: Oropharynx is clear and moist. No oropharyngeal exudate.  Mild nasal congestion bilaterally  Eyes: Conjunctivae and EOM are normal. Pupils are equal, round, and reactive to light. Right eye exhibits no discharge. Left eye exhibits no discharge. No scleral icterus.  Neck: Normal range of motion. Neck supple. No thyromegaly present.  No carotid bruits  Cardiovascular: Normal rate, regular rhythm, normal heart sounds and intact distal pulses.  Exam reveals no gallop and no friction rub.   No murmur heard. At 72 per minute  Pulmonary/Chest: Effort normal and breath sounds normal. No respiratory distress. He has no wheezes. He has no rales. He exhibits no tenderness.  No axillary nodes  Abdominal: Soft. Bowel sounds are normal. He exhibits no mass. There is no tenderness. There is no rebound and no guarding.  No inguinal nodes  Genitourinary: Rectum normal. Penile tenderness present.  The prostate is enlarged and smooth without lumps or masses.  There were no intrarectal masses. The external genitalia were normal and there were no inguinal hernias palpated.  Musculoskeletal: Normal range of motion. He exhibits no edema and no tenderness.  Lymphadenopathy:    He has no cervical adenopathy.  Neurological: He is alert and oriented to person, place, and time. He has normal reflexes. No cranial nerve deficit.  Skin: Skin is warm and dry. Rash noted. No erythema. No pallor.  Mild atopic dermatitis right leg  Psychiatric: He has a normal mood  and affect. His behavior is normal. Judgment and thought content normal.   BP 102/66  Pulse 66  Temp(Src) 97.1 F (36.2 C) (Oral)  Ht '5\' 10"'  (1.778 m)  Wt 207 lb (93.895 kg)  BMI 29.70 kg/m2        Assessment & Plan:  1. Hyperlipidemia - POCT CBC - NMR, lipoprofile  2. Essential hypertension - POCT CBC - BMP8+EGFR - Hepatic function panel  3. Hypothyroidism, unspecified hypothyroidism type - POCT CBC - Thyroid Panel With TSH  4. Metabolic syndrome - POCT CBC - POCT glycosylated hemoglobin (Hb A1C)  5. Annual physical exam  - POCT CBC  6. Elevated PSA - POCT CBC - POCT UA - Microscopic Only - POCT urinalysis dipstick - PSA, total and free  7. Vitamin D deficiency - Vit D  25 hydroxy (rtn osteoporosis monitoring)  8. BPH (benign prostatic hyperplasia) Meds ordered this encounter  Medications  . pioglitazone (ACTOS) 30 MG tablet    Sig: TAKE 1 TABLET (30 MG TOTAL) BY MOUTH DAILY.    Dispense:  90 tablet    Refill:  3  . levothyroxine (SYNTHROID, LEVOTHROID) 75 MCG tablet    Sig: Take 1 tablet (75 mcg total) by mouth daily.    Dispense:  90 tablet    Refill:  3  . benazepril (LOTENSIN) 20 MG tablet    Sig: Take 1 tablet (20 mg total) by mouth daily.    Dispense:  90 tablet    Refill:  3  . atorvastatin (LIPITOR) 40 MG tablet    Sig: Take 1 tablet (40 mg total) by mouth daily. As directed    Dispense:  90 tablet    Refill:  3   Patient Instructions  Continue current medications. Continue good therapeutic lifestyle changes which include good diet and exercise. Fall precautions discussed with patient. If an FOBT was given today- please return it to our front desk. If you are over 70 years old - you may need Prevnar 5 or the adult Pneumonia vaccine. If the gastroenterologist does not call you by September for your colonoscopy, please call us and we will call him and arrange this for you We will also arrange for you to have a stress test Use  Cortisone  10 for rash on leg sparingly twice daily Continue aggressive therapeutic lifestyle changes                       Medicare Annual Wellness Visit  [facility] and the medical providers at [facility] strive to bring you the best medical care.  In doing so we not only want to address your current medical conditions and concerns but also to detect new conditions early and prevent illness, disease and health-related problems.    Medicare offers a yearly Wellness Visit which allows our clinical staff to assess your need for preventative services including immunizations, lifestyle education, counseling to decrease risk of preventable diseases and screening for fall risk  and other medical concerns.    This visit is provided free of charge (no copay) for all Medicare recipients. The clinical pharmacists at [facility] have begun to conduct these Wellness Visits which will also include a thorough review of all your medications.    As you primary medical provider recommend that you make an appointment for your Annual Wellness Visit if you have not done so already this year.  You may set up this appointment before you leave today or you may call back ([phone]) and schedule an appointment.  Please make sure when you call that you mention that you are scheduling your Annual Wellness Visit with the clinical pharmacist so that the appointment may be made for the proper length of time.      Arrie Senate MD

## 2013-11-07 NOTE — Addendum Note (Signed)
Addended by: Zannie Cove on: 11/07/2013 09:23 AM   Modules accepted: Orders

## 2013-11-08 ENCOUNTER — Encounter: Payer: Self-pay | Admitting: Family Medicine

## 2013-11-08 ENCOUNTER — Telehealth: Payer: Self-pay | Admitting: Family Medicine

## 2013-11-08 LAB — NMR, LIPOPROFILE
Cholesterol: 150 mg/dL (ref 100–199)
HDL CHOLESTEROL BY NMR: 60 mg/dL (ref >39)
HDL PARTICLE NUMBER: 30.2 umol/L — AB (ref >=30.5)
LDL Particle Number: 678 nmol/L (ref <1000)
LDL Size: 21.9 nm (ref >20.5)
LDLC SERPL CALC-MCNC: 77 mg/dL (ref 0–99)
Small LDL Particle Number: 222 nmol/L (ref <=527)
Triglycerides by NMR: 67 mg/dL (ref 0–149)

## 2013-11-08 LAB — HEPATIC FUNCTION PANEL
ALBUMIN: 3.9 g/dL (ref 3.6–4.8)
ALK PHOS: 56 IU/L (ref 39–117)
ALT: 22 IU/L (ref 0–44)
AST: 25 IU/L (ref 0–40)
Bilirubin, Direct: 0.23 mg/dL (ref 0.00–0.40)
Total Bilirubin: 0.9 mg/dL (ref 0.0–1.2)
Total Protein: 6.1 g/dL (ref 6.0–8.5)

## 2013-11-08 LAB — BMP8+EGFR
BUN / CREAT RATIO: 17 (ref 10–22)
BUN: 19 mg/dL (ref 8–27)
CHLORIDE: 105 mmol/L (ref 97–108)
CO2: 25 mmol/L (ref 18–29)
Calcium: 9.4 mg/dL (ref 8.6–10.2)
Creatinine, Ser: 1.12 mg/dL (ref 0.76–1.27)
GFR calc non Af Amer: 69 mL/min/{1.73_m2} (ref >59)
GFR, EST AFRICAN AMERICAN: 80 mL/min/{1.73_m2} (ref >59)
Glucose: 92 mg/dL (ref 65–99)
Potassium: 4.2 mmol/L (ref 3.5–5.2)
SODIUM: 142 mmol/L (ref 134–144)

## 2013-11-08 LAB — VITAMIN D 25 HYDROXY (VIT D DEFICIENCY, FRACTURES): Vit D, 25-Hydroxy: 50.8 ng/mL (ref 30.0–100.0)

## 2013-11-08 LAB — PSA, TOTAL AND FREE
PSA FREE: 0.47 ng/mL
PSA, Free Pct: 21.4 %
PSA: 2.2 ng/mL (ref 0.0–4.0)

## 2013-11-08 LAB — THYROID PANEL WITH TSH
FREE THYROXINE INDEX: 3.1 (ref 1.2–4.9)
T3 Uptake Ratio: 34 % (ref 24–39)
T4, Total: 9.2 ug/dL (ref 4.5–12.0)
TSH: 0.951 u[IU]/mL (ref 0.450–4.500)

## 2013-11-08 NOTE — Telephone Encounter (Signed)
Message copied by Waverly Ferrari on Tue Nov 08, 2013  8:43 AM ------      Message from: Chipper Herb      Created: Mon Nov 07, 2013  1:03 PM       The urinalysis is negative for infection but does have 5-10 red blood cells. Please wait 10-14 days and repeat urinalysis. The patient does not have to be fasting      The hemoglobin A1c is excellent at 5.2%. This indicates good blood sugar control over the past 3 months.      The CBC has a white blood cell count that is slightly decreased and this is consistent with past readings. The hemoglobin is also slightly decreased at 13.1 and this is consistent with past readings. The platelet count is adequate.--- Please have the patient repeat his FOBT ------

## 2013-12-15 ENCOUNTER — Ambulatory Visit (INDEPENDENT_AMBULATORY_CARE_PROVIDER_SITE_OTHER): Payer: Medicare Other | Admitting: Cardiovascular Disease

## 2013-12-15 ENCOUNTER — Ambulatory Visit (HOSPITAL_COMMUNITY)
Admission: RE | Admit: 2013-12-15 | Discharge: 2013-12-15 | Disposition: A | Discharge status: Home / Self Care | Payer: Medicare Other | Source: Ambulatory Visit | Attending: Internal Medicine | Admitting: Internal Medicine

## 2013-12-15 ENCOUNTER — Encounter: Payer: Self-pay | Admitting: Cardiovascular Disease

## 2013-12-15 VITALS — BP 114/68 | HR 67 | Ht 70.0 in | Wt 206.2 lb

## 2013-12-15 DIAGNOSIS — R079 Chest pain, unspecified: Secondary | ICD-10-CM | POA: Diagnosis not present

## 2013-12-15 DIAGNOSIS — I10 Essential (primary) hypertension: Secondary | ICD-10-CM

## 2013-12-15 DIAGNOSIS — E039 Hypothyroidism, unspecified: Secondary | ICD-10-CM | POA: Diagnosis not present

## 2013-12-15 DIAGNOSIS — E785 Hyperlipidemia, unspecified: Secondary | ICD-10-CM

## 2013-12-15 NOTE — Progress Notes (Signed)
Patient ID: Shawn Ochoa, male   DOB: 06-07-1950, 63 y.o.   MRN: [id number] Mr. Rho was referred for a treadmill stress test as part of Dr. Corena Pilgrim health wellness management strategy. He has no cardiac symptoms and I don't think that a cardiology consult was actually intended. Will try to schedule his treadmill today.

## 2013-12-15 NOTE — Procedures (Signed)
Exercise Treadmill Test   Test  Exercise Tolerance Test Ordering MD: Sanda Klein, MD    Unique Test No: 1   Treadmill:  1  Indication for ETT: chest pain - rule out ischemia  Contraindication to ETT: No   Stress Modality: exercise - treadmill  Cardiac Imaging Performed: non   Protocol: standard Bruce - maximal  Max BP:  146/90  Max MPHR (bpm):  155 85% MPR (bpm):  131  MPHR obtained (bpm):  146 % MPHR obtained:  94  Reached 85% MPHR (min:sec):  7:30 Total Exercise Time (min-sec):  9:07  Workload in METS:  10.2 Borg Scale:   Reason ETT Terminated:  fatigue    ST Segment Analysis At Rest: normal ST segments - no evidence of significant ST depression With Exercise: Upsloping /scooped ST segment depression  Other Information Arrhythmia:  No Angina during ETT:  absent (0) Quality of ETT:  diagnostic  ETT Interpretation:  normal - no evidence of ischemia by ST analysis  Comments: Mild upsloping, scooped ST depression inferiorly and laterally, not suggestive of ischemia Good exercise tolerance for age Normal BP response to exercise Normal exercise stress test for age  Pixie Casino, MD, [facility] Attending Cardiologist [facility]

## 2013-12-21 ENCOUNTER — Other Ambulatory Visit: Payer: Medicare Other

## 2014-01-31 ENCOUNTER — Other Ambulatory Visit: Payer: Medicare Other

## 2014-01-31 DIAGNOSIS — Z1212 Encounter for screening for malignant neoplasm of rectum: Secondary | ICD-10-CM

## 2014-02-01 LAB — FECAL OCCULT BLOOD, IMMUNOCHEMICAL: Fecal Occult Bld: NEGATIVE

## 2014-05-07 ENCOUNTER — Other Ambulatory Visit: Payer: Self-pay | Admitting: Family Medicine

## 2014-05-12 ENCOUNTER — Ambulatory Visit (INDEPENDENT_AMBULATORY_CARE_PROVIDER_SITE_OTHER): Payer: Medicare Other

## 2014-05-12 ENCOUNTER — Ambulatory Visit (INDEPENDENT_AMBULATORY_CARE_PROVIDER_SITE_OTHER): Payer: Medicare Other | Admitting: Family Medicine

## 2014-05-12 ENCOUNTER — Encounter: Payer: Self-pay | Admitting: Family Medicine

## 2014-05-12 VITALS — BP 135/72 | HR 70 | Temp 97.2°F | Ht 70.0 in | Wt 212.0 lb

## 2014-05-12 DIAGNOSIS — I10 Essential (primary) hypertension: Secondary | ICD-10-CM

## 2014-05-12 DIAGNOSIS — E039 Hypothyroidism, unspecified: Secondary | ICD-10-CM

## 2014-05-12 DIAGNOSIS — E8881 Metabolic syndrome: Secondary | ICD-10-CM

## 2014-05-12 DIAGNOSIS — B070 Plantar wart: Secondary | ICD-10-CM

## 2014-05-12 DIAGNOSIS — R972 Elevated prostate specific antigen [PSA]: Secondary | ICD-10-CM

## 2014-05-12 DIAGNOSIS — E559 Vitamin D deficiency, unspecified: Secondary | ICD-10-CM

## 2014-05-12 DIAGNOSIS — N400 Enlarged prostate without lower urinary tract symptoms: Secondary | ICD-10-CM

## 2014-05-12 DIAGNOSIS — M79644 Pain in right finger(s): Secondary | ICD-10-CM

## 2014-05-12 DIAGNOSIS — Z23 Encounter for immunization: Secondary | ICD-10-CM

## 2014-05-12 DIAGNOSIS — E785 Hyperlipidemia, unspecified: Secondary | ICD-10-CM

## 2014-05-12 LAB — POCT CBC
Granulocyte percent: 62.6 %G (ref 37–80)
HCT, POC: 43.2 % — AB (ref 43.5–53.7)
HEMOGLOBIN: 13.1 g/dL — AB (ref 14.1–18.1)
Lymph, poc: 1.4 (ref 0.6–3.4)
MCH: 28.3 pg (ref 27–31.2)
MCHC: 30.3 g/dL — AB (ref 31.8–35.4)
MCV: 93.3 fL (ref 80–97)
MPV: 6.9 fL (ref 0–99.8)
PLATELET COUNT, POC: 301 10*3/uL (ref 142–424)
POC Granulocyte: 3.1 (ref 2–6.9)
POC LYMPH PERCENT: 29.2 %L (ref 10–50)
RBC: 4.6 M/uL — AB (ref 4.69–6.13)
RDW, POC: 13.8 %
WBC: 4.9 10*3/uL (ref 4.6–10.2)

## 2014-05-12 NOTE — Patient Instructions (Addendum)
Medicare Annual Wellness Visit  [facility] and the medical providers at [facility] strive to bring you the best medical care.  In doing so we not only want to address your current medical conditions and concerns but also to detect new conditions early and prevent illness, disease and health-related problems.    Medicare offers a yearly Wellness Visit which allows our clinical staff to assess your need for preventative services including immunizations, lifestyle education, counseling to decrease risk of preventable diseases and screening for fall risk and other medical concerns.    This visit is provided free of charge (no copay) for all Medicare recipients. The clinical pharmacists at [facility] have begun to conduct these Wellness Visits which will also include a thorough review of all your medications.    As you primary medical provider recommend that you make an appointment for your Annual Wellness Visit if you have not done so already this year.  You may set up this appointment before you leave today or you may call back ([phone]) and schedule an appointment.  Please make sure when you call that you mention that you are scheduling your Annual Wellness Visit with the clinical pharmacist so that the appointment may be made for the proper length of time.     Continue current medications. Continue good therapeutic lifestyle changes which include good diet and exercise. Fall precautions discussed with patient. If an FOBT was given today- please return it to our front desk. If you are over 34 years old - you may need Prevnar 69 or the adult Pneumonia vaccine.  Flu Shots will be available at our office starting mid- September. Please call and schedule a FLU CLINIC APPOINTMENT.   The Prevnar vaccine that you received today may make her arm somewhat sore. Use good pulmonary hygiene. Drink plenty of fluids, use Mucinex maximum  strength plain, blue and white in color and take one twice a day as needed for cough and congestion with a large glass of water. Use a cool mist humidifier in your adrenaline Keep the house as cool as possible Use nasal saline over-the-counter during the day We will call you with the results of your lab work and x-ray as soon as those results become available The warts that were treated with cryotherapy may form blisters if they do an you have problems with this please return to clinic and we will open these blisters. Recheck foot and 4 weeks or sooner if necessary Try to work on losing weight that you have gained over the winter season. Try taking some Aleve 1 twice daily after breakfast and supper for a couple weeks to see if this helps the inflammation in your finger

## 2014-05-12 NOTE — Progress Notes (Signed)
Subjective:    Patient ID: Shawn Ochoa, male    DOB: [birth year], 64 y.o.   MRN: [id number]  HPI Pt here for follow up and management of chronic medical problems which includes hypothyroid, hypertension, and hyperlipidemia. He is taking medications regularly. He takes medication for his cholesterol, blood pressure, vitamin D, thyroid and metabolic syndrome. He does complain of some warts on his foot which we will look at today and some arthralgias in his right index finger and swelling. About 6 weeks ago a ladder collapsed down on his finger and he has had problems since that time. The warts on his foot have been treated on multiple occasions by the dermatologist but they seem to take persist. Recent lab work from this past summer was reviewed with the patient and he has a complete understanding of these results.           Patient Active Problem List   Diagnosis Date Noted  . Hypertension 12/06/2012  . Hyperlipidemia 12/06/2012  . Hypothyroidism 12/06/2012  . Metabolic syndrome 93/96/8864  . Knee cartilage, torn, right    Outpatient Encounter Prescriptions as of 05/12/2014  Medication Sig  . aspirin 81 MG EC tablet Take 81 mg by mouth daily.    Marland Kitchen atorvastatin (LIPITOR) 40 MG tablet Take 1 tablet (40 mg total) by mouth daily. As directed  . benazepril (LOTENSIN) 20 MG tablet Take 1 tablet (20 mg total) by mouth daily.  . Calcium Carbonate-Vitamin D (CALCIUM + D PO) Take 1 capsule by mouth daily.    . Cholecalciferol (VITAMIN D3) 5000 UNITS CAPS Take 1 capsule by mouth daily.    Marland Kitchen levothyroxine (SYNTHROID, LEVOTHROID) 75 MCG tablet Take 1 tablet (75 mcg total) by mouth daily.  . Omega-3 Fatty Acids (FISH OIL) 1000 MG CAPS Take 1 capsule by mouth 2 (two) times daily.    . pioglitazone (ACTOS) 30 MG tablet TAKE 1 TABLET (30 MG TOTAL) BY MOUTH DAILY.  . [DISCONTINUED] levothyroxine (SYNTHROID, LEVOTHROID) 75 MCG tablet TAKE 1 TABLET (75 MCG TOTAL) BY MOUTH DAILY.    Review  of Systems  Constitutional: Negative.   HENT: Negative.   Eyes: Negative.   Respiratory: Negative.   Cardiovascular: Negative.   Gastrointestinal: Negative.   Endocrine: Negative.   Genitourinary: Negative.   Musculoskeletal: Positive for arthralgias (right index finger - swelling and pain).  Skin: Negative.        Warts on right foot  Allergic/Immunologic: Negative.   Neurological: Negative.   Hematological: Negative.   Psychiatric/Behavioral: Negative.        Objective:   Physical Exam  Constitutional: He is oriented to person, place, and time. He appears well-developed and well-nourished. No distress.  HENT:  Head: Normocephalic and atraumatic.  Right Ear: External ear normal.  Left Ear: External ear normal.  Mouth/Throat: Oropharynx is clear and moist. No oropharyngeal exudate.  Minimal nasal congestion  Eyes: Conjunctivae and EOM are normal. Pupils are equal, round, and reactive to light. Right eye exhibits no discharge. Left eye exhibits no discharge. No scleral icterus.  Neck: Normal range of motion. Neck supple. No thyromegaly present.  No anterior cervical nodes or carotid bruits  Cardiovascular: Normal rate, regular rhythm, normal heart sounds and intact distal pulses.   No murmur heard. The rhythm is regular at 72/m without murmur  Pulmonary/Chest: Effort normal and breath sounds normal. No respiratory distress. He has no wheezes. He has no rales. He exhibits no tenderness.  Abdominal: Soft. Bowel sounds are normal. He exhibits  no mass. There is no tenderness. There is no rebound and no guarding.  No abdominal bruits or organ enlargement  Musculoskeletal: Normal range of motion. He exhibits no edema or tenderness.  The right index finger somewhat swollen at the PIP joint and somewhat tender laterally with palpation. He is able to fully extend and flex the finger but the flexion is limited due to the swelling in the finger.  Lymphadenopathy:    He has no cervical  adenopathy.  Neurological: He is alert and oriented to person, place, and time. He has normal reflexes. No cranial nerve deficit.  Skin: Skin is warm and dry. No rash noted. No erythema. No pallor.  The patient has 2 plantar warts on the bottom of the right foot. Cryotherapy was performed on both of these warts without complication and the patient tolerated the procedure well.  Psychiatric: He has a normal mood and affect. His behavior is normal. Judgment and thought content normal.  Nursing note and vitals reviewed.  BP 135/72 mmHg  Pulse 70  Temp(Src) 97.2 F (36.2 C) (Oral)  Ht '5\' 10"'  (1.778 m)  Wt 212 lb (96.163 kg)  BMI 30.42 kg/m2  [facility] reading (PRIMARY) by  Dr.Wilburta Milbourn-right index finger-- DIP joint space narrowing otherwise no acute abnormality                                 Results for orders placed or performed in visit on 05/12/14  POCT CBC  Result Value Ref Range   WBC 4.9 4.6 - 10.2 K/uL   Lymph, poc 1.4 0.6 - 3.4   POC LYMPH PERCENT 29.2 10 - 50 %L   POC Granulocyte 3.1 2 - 6.9   Granulocyte percent 62.6 37 - 80 %G   RBC 4.6 (A) 4.69 - 6.13 M/uL   Hemoglobin 13.1 (A) 14.1 - 18.1 g/dL   HCT, POC 43.2 (A) 43.5 - 53.7 %   MCV 93.3 80 - 97 fL   MCH, POC 28.3 27 - 31.2 pg   MCHC 30.3 (A) 31.8 - 35.4 g/dL   RDW, POC 13.8 %   Platelet Count, POC 301.0 142 - 424 K/uL   MPV 6.9 0 - 99.8 fL         Assessment & Plan:  1. Hyperlipidemia -Continue aggressive therapeutic lifestyle changes and current treatment for cholesterol - POCT CBC - NMR, lipoprofile  2. Essential hypertension -The patient will continue with the Lotensin and we'll continue to watch his sodium intake - POCT CBC - BMP8+EGFR - Hepatic function panel  3. Hypothyroidism, unspecified hypothyroidism type -Continue with current thyroid treatment and with lab work is returned medication adjustment will be considered if necessary - POCT CBC - Thyroid Panel With TSH  4. Metabolic syndrome -Continue  with Actos and get back into an exercise program and work on losing weight - POCT CBC  5. Elevated PSA -This issue has been resolved - POCT CBC  6. Vitamin D deficiency -Vitamin D will be adjusted once lab work is returned - POCT CBC - Vit D  25 hydroxy (rtn osteoporosis monitoring)  7. BPH (benign prostatic hyperplasia) -We will check his prostate at the next visit - POCT CBC  8. Finger pain, right -Take Aleve twice daily after breakfast and supper for a couple weeks - DG Finger Index Right; Future  9. Plantar warts -Following cryotherapy today we will ask the patient to return to the clinic in  about 4 weeks for follow-up.  Patient Instructions                       Medicare Annual Wellness Visit  [facility] and the medical providers at [facility] strive to bring you the best medical care.  In doing so we not only want to address your current medical conditions and concerns but also to detect new conditions early and prevent illness, disease and health-related problems.    Medicare offers a yearly Wellness Visit which allows our clinical staff to assess your need for preventative services including immunizations, lifestyle education, counseling to decrease risk of preventable diseases and screening for fall risk and other medical concerns.    This visit is provided free of charge (no copay) for all Medicare recipients. The clinical pharmacists at [facility] have begun to conduct these Wellness Visits which will also include a thorough review of all your medications.    As you primary medical provider recommend that you make an appointment for your Annual Wellness Visit if you have not done so already this year.  You may set up this appointment before you leave today or you may call back ([phone]) and schedule an appointment.  Please make sure when you call that you mention that you are scheduling your Annual Wellness Visit with the  clinical pharmacist so that the appointment may be made for the proper length of time.     Continue current medications. Continue good therapeutic lifestyle changes which include good diet and exercise. Fall precautions discussed with patient. If an FOBT was given today- please return it to our front desk. If you are over 16 years old - you may need Prevnar 85 or the adult Pneumonia vaccine.  Flu Shots will be available at our office starting mid- September. Please call and schedule a FLU CLINIC APPOINTMENT.   The Prevnar vaccine that you received today may make her arm somewhat sore. Use good pulmonary hygiene. Drink plenty of fluids, use Mucinex maximum strength plain, blue and white in color and take one twice a day as needed for cough and congestion with a large glass of water. Use a cool mist humidifier in your adrenaline Keep the house as cool as possible Use nasal saline over-the-counter during the day We will call you with the results of your lab work and x-ray as soon as those results become available The warts that were treated with cryotherapy may form blisters if they do an you have problems with this please return to clinic and we will open these blisters. Recheck foot and 4 weeks or sooner if necessary Try to work on losing weight that you have gained over the winter season. Try taking some Aleve 1 twice daily after breakfast and supper for a couple weeks to see if this helps the inflammation in your finger   Arrie Senate MD

## 2014-05-13 LAB — HEPATIC FUNCTION PANEL
ALT: 20 IU/L (ref 0–44)
AST: 20 IU/L (ref 0–40)
Albumin: 3.8 g/dL (ref 3.6–4.8)
Alkaline Phosphatase: 56 IU/L (ref 39–117)
Bilirubin, Direct: 0.22 mg/dL (ref 0.00–0.40)
Total Bilirubin: 0.8 mg/dL (ref 0.0–1.2)
Total Protein: 6.4 g/dL (ref 6.0–8.5)

## 2014-05-13 LAB — THYROID PANEL WITH TSH
Free Thyroxine Index: 3 (ref 1.2–4.9)
T3 Uptake Ratio: 35 % (ref 24–39)
T4, Total: 8.7 ug/dL (ref 4.5–12.0)
TSH: 0.975 u[IU]/mL (ref 0.450–4.500)

## 2014-05-13 LAB — BMP8+EGFR
BUN/Creatinine Ratio: 12 (ref 10–22)
BUN: 14 mg/dL (ref 8–27)
CHLORIDE: 104 mmol/L (ref 97–108)
CO2: 25 mmol/L (ref 18–29)
Calcium: 9.6 mg/dL (ref 8.6–10.2)
Creatinine, Ser: 1.14 mg/dL (ref 0.76–1.27)
GFR calc Af Amer: 78 mL/min/{1.73_m2} (ref >59)
GFR calc non Af Amer: 67 mL/min/{1.73_m2} (ref >59)
Glucose: 89 mg/dL (ref 65–99)
POTASSIUM: 4.3 mmol/L (ref 3.5–5.2)
SODIUM: 143 mmol/L (ref 134–144)

## 2014-05-13 LAB — NMR, LIPOPROFILE
CHOLESTEROL: 160 mg/dL (ref 100–199)
HDL CHOLESTEROL BY NMR: 66 mg/dL (ref >39)
HDL PARTICLE NUMBER: 32.4 umol/L (ref >=30.5)
LDL Particle Number: 591 nmol/L (ref <1000)
LDL SIZE: 21.8 nm (ref >20.5)
LDL-C: 79 mg/dL (ref 0–99)
SMALL LDL PARTICLE NUMBER: 116 nmol/L (ref <=527)
TRIGLYCERIDES BY NMR: 74 mg/dL (ref 0–149)

## 2014-05-13 LAB — VITAMIN D 25 HYDROXY (VIT D DEFICIENCY, FRACTURES): Vit D, 25-Hydroxy: 50.3 ng/mL (ref 30.0–100.0)

## 2014-05-19 ENCOUNTER — Telehealth: Payer: Self-pay | Admitting: *Deleted

## 2014-05-19 NOTE — Telephone Encounter (Signed)
-----   Message from Chipper Herb, MD sent at 05/13/2014  9:53 AM EST ----- The blood sugar is good at 89. The creatinine, the most important kidney function test is within normal limits. The electrolytes including potassium are within normal limits. All liver function tests are within normal limits. All cholesterol numbers are excellent and at goal with a total LDL particle number being 591. The LDL C is good at 79 and the triglycerides are good at 74----the patient should continue with his atorvastatin and omega-3 fatty acids and aggressive therapeutic lifestyle changes which include diet and exercise All thyroid function tests are within normal limits The vitamin D level is 50.3 and this is excellent. He should continue with vitamin D3 5000 daily.

## 2014-05-19 NOTE — Telephone Encounter (Signed)
Calling pt to go over results.

## 2014-05-22 NOTE — Telephone Encounter (Signed)
-----   Message from Chipper Herb, MD sent at 05/13/2014  9:53 AM EST ----- The blood sugar is good at 89. The creatinine, the most important kidney function test is within normal limits. The electrolytes including potassium are within normal limits. All liver function tests are within normal limits. All cholesterol numbers are excellent and at goal with a total LDL particle number being 591. The LDL C is good at 79 and the triglycerides are good at 74----the patient should continue with his atorvastatin and omega-3 fatty acids and aggressive therapeutic lifestyle changes which include diet and exercise All thyroid function tests are within normal limits The vitamin D level is 50.3 and this is excellent. He should continue with vitamin D3 5000 daily.

## 2014-11-02 ENCOUNTER — Other Ambulatory Visit: Payer: Self-pay | Admitting: Family Medicine

## 2014-11-24 ENCOUNTER — Ambulatory Visit (INDEPENDENT_AMBULATORY_CARE_PROVIDER_SITE_OTHER): Payer: Medicare Other | Admitting: Family Medicine

## 2014-11-24 ENCOUNTER — Encounter: Payer: Self-pay | Admitting: Family Medicine

## 2014-11-24 VITALS — BP 120/80 | HR 65 | Temp 97.3°F | Ht 70.0 in | Wt 218.0 lb

## 2014-11-24 DIAGNOSIS — N400 Enlarged prostate without lower urinary tract symptoms: Secondary | ICD-10-CM | POA: Diagnosis not present

## 2014-11-24 DIAGNOSIS — E559 Vitamin D deficiency, unspecified: Secondary | ICD-10-CM

## 2014-11-24 DIAGNOSIS — E8881 Metabolic syndrome: Secondary | ICD-10-CM | POA: Diagnosis not present

## 2014-11-24 DIAGNOSIS — E039 Hypothyroidism, unspecified: Secondary | ICD-10-CM

## 2014-11-24 DIAGNOSIS — E785 Hyperlipidemia, unspecified: Secondary | ICD-10-CM | POA: Diagnosis not present

## 2014-11-24 DIAGNOSIS — I10 Essential (primary) hypertension: Secondary | ICD-10-CM

## 2014-11-24 NOTE — Progress Notes (Signed)
Subjective:    Patient ID: Shawn Ochoa, male    DOB: Jun 09, 1950, 64 y.o.   MRN: [id number]  HPI   64 year old male comes in today to follow up on chronic medical conditions which include hypertension, hypothyroidism, hyperlipidemia, and BPH. The prescription list was reviewed with patient.     Patient Active Problem List   Diagnosis Date Noted  . Hypertension 12/06/2012  . Hyperlipidemia 12/06/2012  . Hypothyroidism 12/06/2012  . Metabolic syndrome 80/32/1224  . Knee cartilage, torn, right    Outpatient Encounter Prescriptions as of 11/24/2014  Medication Sig  . aspirin 81 MG EC tablet Take 81 mg by mouth daily.    Marland Kitchen atorvastatin (LIPITOR) 40 MG tablet Take 1 tablet (40 mg total) by mouth daily. As directed  . benazepril (LOTENSIN) 20 MG tablet Take 1 tablet (20 mg total) by mouth daily.  . Calcium Carbonate-Vitamin D (CALCIUM + D PO) Take 1 capsule by mouth daily.    . Cholecalciferol (VITAMIN D3) 5000 UNITS CAPS Take 1 capsule by mouth daily.    Marland Kitchen levothyroxine (SYNTHROID, LEVOTHROID) 75 MCG tablet TAKE 1 TABLET (75 MCG TOTAL) BY MOUTH DAILY.  Marland Kitchen Omega-3 Fatty Acids (FISH OIL) 1000 MG CAPS Take 1 capsule by mouth 2 (two) times daily.    . pioglitazone (ACTOS) 30 MG tablet TAKE 1 TABLET (30 MG TOTAL) BY MOUTH DAILY.  . [DISCONTINUED] levothyroxine (SYNTHROID, LEVOTHROID) 75 MCG tablet Take 1 tablet (75 mcg total) by mouth daily.   No facility-administered encounter medications on file as of 11/24/2014.      Review of Systems  Constitutional: Negative.   HENT: Negative.   Eyes: Negative.   Respiratory: Negative.   Cardiovascular: Negative.   Gastrointestinal: Negative.   Endocrine: Negative.   Genitourinary: Negative.   Musculoskeletal: Positive for myalgias (Cramps in feet at night).  Skin: Negative.   Allergic/Immunologic: Negative.   Neurological: Negative.   Hematological: Negative.   Psychiatric/Behavioral: Negative.        Objective:   Physical Exam   Constitutional: He is oriented to person, place, and time. He appears well-developed and well-nourished. No distress.  HENT:  Head: Normocephalic and atraumatic.  Right Ear: External ear normal.  Left Ear: External ear normal.  Nose: Nose normal.  Mouth/Throat: Oropharynx is clear and moist. No oropharyngeal exudate.  Eyes: Conjunctivae and EOM are normal. Pupils are equal, round, and reactive to light. Right eye exhibits no discharge. Left eye exhibits no discharge. No scleral icterus.  Neck: Normal range of motion. Neck supple. No thyromegaly present.  No carotid bruits thyromegaly or anterior cervical adenopathy  Cardiovascular: Normal rate, regular rhythm, normal heart sounds and intact distal pulses.  Exam reveals no gallop and no friction rub.   No murmur heard. At 72/m  Pulmonary/Chest: Effort normal and breath sounds normal. No respiratory distress. He has no wheezes. He has no rales. He exhibits no tenderness.  Clear anteriorly and posteriorly  Abdominal: Soft. Bowel sounds are normal. He exhibits no mass. There is no tenderness. There is no rebound and no guarding.  Other than abdominal obesity no masses tenderness or organ enlargement  Genitourinary: Rectum normal and penis normal.  The prostate was enlarged but soft and smooth without lumps or masses. There were no rectal masses. There are no inguinal hernias. The external genitalia were within normal limits.  Musculoskeletal: Normal range of motion. He exhibits no edema or tenderness.  Lymphadenopathy:    He has no cervical adenopathy.  Neurological: He is  alert and oriented to person, place, and time. He has normal reflexes. No cranial nerve deficit.  Skin: Skin is warm and dry. No rash noted. No erythema. No pallor.  Psychiatric: He has a normal mood and affect. His behavior is normal. Judgment and thought content normal.  Nursing note and vitals reviewed.   BP 120/80 mmHg  Pulse 65  Temp(Src) 97.3 F (36.3 C) (Oral)   Ht _0  (1.778 m)  Wt 218 lb (98.884 kg)  BMI 31.28 kg/m2       Assessment & Plan:  1. Hyperlipidemia -The patient should continue with his atorvastatin and with more aggressive therapeutic lifestyle changes which include diet and exercise and weight loss - NMR, lipoprofile - Hepatic function panel  2. Essential hypertension -The blood pressure is good today he will continue with current treatment - BMP8+EGFR - CBC with Differential/Platelet  3. Hypothyroidism, unspecified hypothyroidism type -The thyroid has been removed and he should continue with current treatment pending results of lab work  4. Metabolic syndrome -He needs to continue to work on weight loss with more exercise and better diet habits - BMP8+EGFR  5. Vitamin D deficiency -Continue with current treatment pending results of lab work - Vit D  25 hydroxy (rtn osteoporosis monitoring)  6. BPH (benign prostatic hypertrophy) -The patient does have some nocturia and his prostate was enlarged but soft and smooth. - PSA, total and free  No orders of the defined types were placed in this encounter.   Patient Instructions     Continue current medications. Continue good therapeutic lifestyle changes which include good diet and exercise. Fall precautions discussed with patient. If an FOBT was given today- please return it to our front desk. If you are over 99 years old - you may need Prevnar 40 or the adult Pneumonia vaccine.   After your visit with [place] today you will receive a survey in the mail or online from [employer] regarding your care with [place]. Please take a moment to fill this out. Your feedback is very important to [place] as you can help [place] better understand your patient needs as well as improve your experience and satisfaction. WE CARE ABOUT YOU!!!                        Medicare Annual Wellness Visit  [facility] and the medical providers at [facility] strive to bring you the best  medical care.  In doing so we not only want to address your current medical conditions and concerns but also to detect new conditions early and prevent illness, disease and health-related problems.    Medicare offers a yearly Wellness Visit which allows our clinical staff to assess your need for preventative services including immunizations, lifestyle education, counseling to decrease risk of preventable diseases and screening for fall risk and other medical concerns.    This visit is provided free of charge (no copay) for all Medicare recipients. The clinical pharmacists at [facility] have begun to conduct these Wellness Visits which will also include a thorough review of all your medications.    As you primary medical provider recommend that you make an appointment for your Annual Wellness Visit if you have not done so already this year.  You may set up this appointment before you leave today or you may call back ([phone]) and schedule an appointment.  Please make sure when you call that you mention that you are scheduling  your Annual Wellness Visit with the clinical pharmacist so that the appointment may be made for the proper length of time.    It is very important that you continue to work on your weight by exercising more and eating less Drink more water this may also help the leg cramps and foot cramps that you're having at nighttime Consider trying the La croix You will be due to get a Pneumovax in February 2017 Do not forget to get your flu shot in late October   Arrie Senate MD

## 2014-11-24 NOTE — Patient Instructions (Addendum)
    Continue current medications. Continue good therapeutic lifestyle changes which include good diet and exercise. Fall precautions discussed with patient. If an FOBT was given today- please return it to our front desk. If you are over 66 years old - you may need Prevnar 80 or the adult Pneumonia vaccine.   After your visit with [place] today you will receive a survey in the mail or online from [employer] regarding your care with [place]. Please take a moment to fill this out. Your feedback is very important to [place] as you can help [place] better understand your patient needs as well as improve your experience and satisfaction. WE CARE ABOUT YOU!!!                        Medicare Annual Wellness Visit  [facility] and the medical providers at [facility] strive to bring you the best medical care.  In doing so we not only want to address your current medical conditions and concerns but also to detect new conditions early and prevent illness, disease and health-related problems.    Medicare offers a yearly Wellness Visit which allows our clinical staff to assess your need for preventative services including immunizations, lifestyle education, counseling to decrease risk of preventable diseases and screening for fall risk and other medical concerns.    This visit is provided free of charge (no copay) for all Medicare recipients. The clinical pharmacists at [facility] have begun to conduct these Wellness Visits which will also include a thorough review of all your medications.    As you primary medical provider recommend that you make an appointment for your Annual Wellness Visit if you have not done so already this year.  You may set up this appointment before you leave today or you may call back ([phone]) and schedule an appointment.  Please make sure when you call that you mention that you are scheduling your Annual Wellness Visit with the clinical pharmacist so  that the appointment may be made for the proper length of time.    It is very important that you continue to work on your weight by exercising more and eating less Drink more water this may also help the leg cramps and foot cramps that you're having at nighttime Consider trying the La croix You will be due to get a Pneumovax in February 2017 Do not forget to get your flu shot in late October

## 2014-11-25 LAB — BMP8+EGFR
BUN/Creatinine Ratio: 18 (ref 10–22)
BUN: 20 mg/dL (ref 8–27)
CHLORIDE: 104 mmol/L (ref 97–108)
CO2: 23 mmol/L (ref 18–29)
CREATININE: 1.12 mg/dL (ref 0.76–1.27)
Calcium: 8.9 mg/dL (ref 8.6–10.2)
GFR calc non Af Amer: 69 mL/min/{1.73_m2} (ref >59)
GFR, EST AFRICAN AMERICAN: 79 mL/min/{1.73_m2} (ref >59)
Glucose: 94 mg/dL (ref 65–99)
POTASSIUM: 4.2 mmol/L (ref 3.5–5.2)
SODIUM: 141 mmol/L (ref 134–144)

## 2014-11-25 LAB — HEPATIC FUNCTION PANEL
ALT: 23 IU/L (ref 0–44)
AST: 22 IU/L (ref 0–40)
Albumin: 3.8 g/dL (ref 3.6–4.8)
Alkaline Phosphatase: 51 IU/L (ref 39–117)
BILIRUBIN, DIRECT: 0.18 mg/dL (ref 0.00–0.40)
Bilirubin Total: 0.6 mg/dL (ref 0.0–1.2)
Total Protein: 6.1 g/dL (ref 6.0–8.5)

## 2014-11-25 LAB — PSA, TOTAL AND FREE
PROSTATE SPECIFIC AG, SERUM: 2.1 ng/mL (ref 0.0–4.0)
PSA FREE PCT: 25.2 %
PSA FREE: 0.53 ng/mL

## 2014-11-25 LAB — NMR, LIPOPROFILE
Cholesterol: 142 mg/dL (ref 100–199)
HDL Cholesterol by NMR: 57 mg/dL (ref >39)
HDL Particle Number: 30.7 umol/L (ref >=30.5)
LDL PARTICLE NUMBER: 653 nmol/L (ref <1000)
LDL SIZE: 21.3 nm (ref >20.5)
LDL-C: 72 mg/dL (ref 0–99)
LP-IR Score: <25 (ref <=45)
Triglycerides by NMR: 66 mg/dL (ref 0–149)

## 2014-11-25 LAB — VITAMIN D 25 HYDROXY (VIT D DEFICIENCY, FRACTURES): Vit D, 25-Hydroxy: 45.5 ng/mL (ref 30.0–100.0)

## 2014-12-09 ENCOUNTER — Other Ambulatory Visit: Payer: Self-pay | Admitting: Family Medicine

## 2014-12-12 ENCOUNTER — Other Ambulatory Visit (INDEPENDENT_AMBULATORY_CARE_PROVIDER_SITE_OTHER): Payer: Medicare Other

## 2014-12-12 DIAGNOSIS — E039 Hypothyroidism, unspecified: Secondary | ICD-10-CM

## 2014-12-12 NOTE — Progress Notes (Signed)
Lab only 

## 2014-12-13 LAB — THYROID PANEL WITH TSH
FREE THYROXINE INDEX: 3 (ref 1.2–4.9)
T3 Uptake Ratio: 33 % (ref 24–39)
T4, Total: 9 ug/dL (ref 4.5–12.0)
TSH: 1.02 u[IU]/mL (ref 0.450–4.500)

## 2014-12-18 ENCOUNTER — Other Ambulatory Visit: Payer: Self-pay | Admitting: Family Medicine

## 2015-02-03 ENCOUNTER — Other Ambulatory Visit: Payer: Self-pay | Admitting: Family Medicine

## 2015-03-05 ENCOUNTER — Other Ambulatory Visit: Payer: Medicare Other

## 2015-03-05 NOTE — Progress Notes (Signed)
Lab only 

## 2015-03-11 LAB — FECAL OCCULT BLOOD, IMMUNOCHEMICAL: Fecal Occult Bld: NEGATIVE

## 2015-03-15 ENCOUNTER — Other Ambulatory Visit: Payer: Self-pay | Admitting: Family Medicine

## 2015-04-02 ENCOUNTER — Other Ambulatory Visit: Payer: Self-pay | Admitting: Internal Medicine

## 2015-04-02 MED ORDER — BENAZEPRIL HCL 20 MG PO TABS: ORAL_TABLET | ORAL | Status: DC

## 2015-04-02 NOTE — Progress Notes (Signed)
Lisinopril d/c'd in Error on wrong patient and reordered back to original

## 2015-05-28 ENCOUNTER — Ambulatory Visit (INDEPENDENT_AMBULATORY_CARE_PROVIDER_SITE_OTHER): Payer: Medicare HMO | Admitting: Family Medicine

## 2015-05-28 ENCOUNTER — Ambulatory Visit (INDEPENDENT_AMBULATORY_CARE_PROVIDER_SITE_OTHER): Payer: Medicare HMO

## 2015-05-28 ENCOUNTER — Encounter: Payer: Self-pay | Admitting: Family Medicine

## 2015-05-28 VITALS — BP 110/68 | HR 71 | Temp 96.9°F | Ht 70.0 in | Wt 222.0 lb

## 2015-05-28 DIAGNOSIS — R638 Other symptoms and signs concerning food and fluid intake: Secondary | ICD-10-CM

## 2015-05-28 DIAGNOSIS — N400 Enlarged prostate without lower urinary tract symptoms: Secondary | ICD-10-CM

## 2015-05-28 DIAGNOSIS — E559 Vitamin D deficiency, unspecified: Secondary | ICD-10-CM

## 2015-05-28 DIAGNOSIS — E039 Hypothyroidism, unspecified: Secondary | ICD-10-CM

## 2015-05-28 DIAGNOSIS — E8881 Metabolic syndrome: Secondary | ICD-10-CM | POA: Diagnosis not present

## 2015-05-28 DIAGNOSIS — I10 Essential (primary) hypertension: Secondary | ICD-10-CM | POA: Diagnosis not present

## 2015-05-28 DIAGNOSIS — E785 Hyperlipidemia, unspecified: Secondary | ICD-10-CM

## 2015-05-28 LAB — POCT UA - MICROSCOPIC ONLY
Bacteria, U Microscopic: NEGATIVE
CASTS, UR, LPF, POC: NEGATIVE
Crystals, Ur, HPF, POC: NEGATIVE
Mucus, UA: NEGATIVE
RBC, urine, microscopic: NEGATIVE
YEAST UA: NEGATIVE

## 2015-05-28 LAB — POCT URINALYSIS DIPSTICK
Bilirubin, UA: NEGATIVE
Glucose, UA: NEGATIVE
KETONES UA: NEGATIVE
Nitrite, UA: NEGATIVE
PH UA: 5
PROTEIN UA: NEGATIVE
RBC UA: NEGATIVE
SPEC GRAV UA: 1.015
Urobilinogen, UA: NEGATIVE

## 2015-05-28 NOTE — Progress Notes (Signed)
Subjective:    Patient ID: Shawn Ochoa, male    DOB: 07/11/1950, 66 y.o.   MRN: [id number]  HPI Pt here for follow up and management of chronic medical problems which includes hypertension, hyperlipidemia, and hypothyroid. He is taking medications regularly. Patient is doing well overall and specifically has no complaints. He did get a colonoscopy sometime in 2016 at [facility]. We do not have a report on this. There is a family history of colon cancer and this is why he gets his colonoscopy every 5 years. 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 He denies any chest pain shortness of breath trouble swallowing and heartburn and indigestion nausea vomiting diarrhea or black tarry stools. He did have a colonoscopy and this was within normal limits. He says that occasionally he'll have some bright red blood per rectum but this was going on when he had the colonoscopy. He's passing his water without problems and has no problems with urgency during the day but does have some frequency at nighttime. We will make sure that we check a urinalysis today.      Patient Active Problem List   Diagnosis Date Noted  . Hypertension 12/06/2012  . Hyperlipidemia 12/06/2012  . Hypothyroidism 12/06/2012  . Metabolic syndrome 50/12/3816  . Knee cartilage, torn, right    Outpatient Encounter Prescriptions as of 05/28/2015  Medication Sig  . aspirin 81 MG EC tablet Take 81 mg by mouth daily.    Marland Kitchen atorvastatin (LIPITOR) 40 MG tablet TAKE 1 TABLET (40 MG TOTAL) BY MOUTH DAILY. AS DIRECTED  . benazepril (LOTENSIN) 20 MG tablet TAKE 1 TABLET (20 MG TOTAL) BY MOUTH DAILY.  . Calcium Carbonate-Vitamin D (CALCIUM + D PO) Take 1 capsule by mouth daily.    . Cholecalciferol (VITAMIN D3) 5000 UNITS CAPS Take 1 capsule by mouth daily.      Marland Kitchen levothyroxine (SYNTHROID, LEVOTHROID) 75 MCG tablet TAKE 1 TABLET (75 MCG TOTAL) BY MOUTH DAILY.  Marland Kitchen Omega-3 Fatty Acids (FISH OIL) 1000 MG CAPS Take 1 capsule by mouth 2 (two) times daily.    . pioglitazone (ACTOS) 30 MG tablet TAKE 1 TABLET (30 MG TOTAL) BY MOUTH DAILY.   No facility-administered encounter medications on file as of 05/28/2015.      Review of Systems  Constitutional: Negative.   HENT: Negative.   Eyes: Negative.   Respiratory: Negative.   Cardiovascular: Negative.   Gastrointestinal: Negative.   Endocrine: Negative.   Genitourinary: Negative.   Musculoskeletal: Negative.   Skin: Negative.   Allergic/Immunologic: Negative.   Neurological: Negative.   Hematological: Negative.   Psychiatric/Behavioral: Negative.        Objective:   Physical Exam  Constitutional: He is oriented to person, place, and time. He appears well-developed and well-nourished.  HENT:  Head: Normocephalic and atraumatic.  Right Ear: External ear normal.  Left Ear: External ear normal.  Nose: Nose normal.  Mouth/Throat: Oropharynx is clear and moist. No oropharyngeal exudate.  Eyes: Conjunctivae and EOM are normal. Pupils are equal, round, and reactive to light. Right eye exhibits no discharge. Left eye exhibits no discharge. No scleral icterus.  Neck: Normal range of motion. Neck supple. No thyromegaly present.  No bruits or thyromegaly or anterior cervical adenopathy  Cardiovascular: Normal rate, regular rhythm, normal heart  sounds and intact distal pulses.   No murmur heard. The heart has a regular rate and rhythm at 72/m  Pulmonary/Chest: Effort normal and breath sounds normal. No respiratory distress. He has no wheezes. He has no rales. He exhibits no tenderness.  Clear anteriorly and posteriorly  Abdominal: Soft. Bowel sounds are normal. He exhibits no mass. There is no tenderness. There is no rebound and no guarding.  Abdominal obesity without laying or liver enlargement and no  epigastric tenderness and no inguinal adenopathy  Musculoskeletal: Normal range of motion. He exhibits no edema or tenderness.  Lymphadenopathy:    He has no cervical adenopathy.  Neurological: He is alert and oriented to person, place, and time. He has normal reflexes. No cranial nerve deficit.  Skin: Skin is warm and dry. No rash noted.  Psychiatric: He has a normal mood and affect. His behavior is normal. Judgment and thought content normal.  Nursing note and vitals reviewed.  BP 110/68 mmHg  Pulse 71  Temp(Src) 96.9 F (36.1 C) (Oral)  Ht '5\' 10"'  (1.778 m)  Wt 222 lb (100.699 kg)  BMI 31.85 kg/m2  [facility] reading (PRIMARY) by  Dr. Brunilda Payor x-ray                                        Assessment & Plan:  1. Hyperlipidemia -Continue current treatment and work more aggressively on diet and weight loss - CBC with Differential/Platelet - NMR, lipoprofile  2. Hypothyroidism, unspecified hypothyroidism type -Continue thyroid replacement pending results of lab work - CBC with Differential/Platelet - Thyroid Panel With TSH  3. Essential hypertension -Blood pressure is good today and patient will continue to watch sodium intake and work on his weight. - BMP8+EGFR - CBC with Differential/Platelet - Hepatic function panel - DG Chest 2 View; Future - POCT urinalysis dipstick - POCT UA - Microscopic Only  4. Vitamin D deficiency -Continue vitamin D replacement pending results of lab work - CBC with Differential/Platelet - VITAMIN D 25 Hydroxy (Vit-D Deficiency, Fractures)  5. BPH (benign prostatic hypertrophy) -The patient's stream is slow and he notices this mostly at nighttime but he does drink water before he goes to bed. - CBC with Differential/Platelet  6. Metabolic syndrome -He understands to work more aggressively with his weight through diet and exercise - BMP8+EGFR - CBC with Differential/Platelet - POCT urinalysis dipstick - POCT UA - Microscopic Only  7.  Increased BMI -More aggressive weight management was emphasized and the patient acknowledges that this is something that he will refocus on and try to do better with.  Patient Instructions                       Medicare Annual Wellness Visit  [employer] and the medical providers at [facility] strive to bring you the best medical care.  In doing so we not only want to address your current medical conditions and concerns but also to detect new conditions early and prevent illness, disease and health-related problems.    Medicare offers a yearly Wellness Visit which allows our clinical staff to assess your need for preventative services including immunizations, lifestyle education, counseling to decrease risk of preventable diseases and screening for fall risk and other medical concerns.    This visit is provided free of charge (no copay) for all Medicare recipients. The clinical pharmacists at [facility] have begun to conduct these Wellness Visits which will also include a thorough review of all your medications.    As you primary medical provider recommend that you make an appointment for your Annual Wellness Visit if you have not done so already this year.  You may set up this appointment before you leave today or you may call back ([phone]) and schedule an appointment.  Please make sure when you call that you mention that you are scheduling your Annual Wellness Visit with the clinical pharmacist so that the appointment may be made for the proper length of time.     Continue current medications. Continue good therapeutic lifestyle changes which include good diet and exercise. Fall precautions discussed with patient. If an FOBT was given today- please return it to our front desk. If you are over 45 years old - you may need Prevnar 4 or the adult Pneumonia vaccine.  **Flu shots are available--- please call and schedule a FLU-CLINIC  appointment**  After your visit with [place] today you will receive a survey in the mail or online from [employer] regarding your care with [place]. Please take a moment to fill this out. Your feedback is very important to [place] as you can help [place] better understand your patient needs as well as improve your experience and satisfaction. WE CARE ABOUT YOU!!!   Continue to work aggressively on weight with diet and exercise Drink more water Reduce carbohydrates in diet Set up an appointment with our clinical pharmacist if you need any help in regard to dieting. Consider Weight Watchers program here at the office   Arrie Senate MD

## 2015-05-28 NOTE — Patient Instructions (Addendum)
Medicare Annual Wellness Visit  [facility] and the medical providers at [facility] strive to bring you the best medical care.  In doing so we not only want to address your current medical conditions and concerns but also to detect new conditions early and prevent illness, disease and health-related problems.    Medicare offers a yearly Wellness Visit which allows our clinical staff to assess your need for preventative services including immunizations, lifestyle education, counseling to decrease risk of preventable diseases and screening for fall risk and other medical concerns.    This visit is provided free of charge (no copay) for all Medicare recipients. The clinical pharmacists at [facility] have begun to conduct these Wellness Visits which will also include a thorough review of all your medications.    As you primary medical provider recommend that you make an appointment for your Annual Wellness Visit if you have not done so already this year.  You may set up this appointment before you leave today or you may call back [phone] and schedule an appointment.  Please make sure when you call that you mention that you are scheduling your Annual Wellness Visit with the clinical pharmacist so that the appointment may be made for the proper length of time.     Continue current medications. Continue good therapeutic lifestyle changes which include good diet and exercise. Fall precautions discussed with patient. If an FOBT was given today- please return it to our front desk. If you are over 71 years old - you may need Prevnar 42 or the adult Pneumonia vaccine.  **Flu shots are available--- please call and schedule a FLU-CLINIC appointment**  After your visit with [place] today you will receive a survey in the mail or online from [employer] regarding your care with [place]. Please take a moment to fill this out. Your feedback is very  important to [place] as you can help [place] better understand your patient needs as well as improve your experience and satisfaction. WE CARE ABOUT YOU!!!   Continue to work aggressively on weight with diet and exercise Drink more water Reduce carbohydrates in diet Set up an appointment with our clinical pharmacist if you need any help in regard to dieting. Consider Weight Watchers program here at the office

## 2015-05-29 LAB — CBC WITH DIFFERENTIAL/PLATELET
BASOS ABS: 0 10*3/uL (ref 0.0–0.2)
Basos: 1 %
EOS (ABSOLUTE): 0.1 10*3/uL (ref 0.0–0.4)
EOS: 3 %
HEMATOCRIT: 41.2 % (ref 37.5–51.0)
HEMOGLOBIN: 13.3 g/dL (ref 12.6–17.7)
IMMATURE GRANULOCYTES: 1 %
Immature Grans (Abs): 0 10*3/uL (ref 0.0–0.1)
LYMPHS: 36 %
Lymphocytes Absolute: 1.3 10*3/uL (ref 0.7–3.1)
MCH: 30.6 pg (ref 26.6–33.0)
MCHC: 32.3 g/dL (ref 31.5–35.7)
MCV: 95 fL (ref 79–97)
MONOCYTES: 12 %
Monocytes Absolute: 0.4 10*3/uL (ref 0.1–0.9)
NEUTROS PCT: 47 %
Neutrophils Absolute: 1.7 10*3/uL (ref 1.4–7.0)
Platelets: 278 10*3/uL (ref 150–379)
RBC: 4.35 x10E6/uL (ref 4.14–5.80)
RDW: 13.4 % (ref 12.3–15.4)
WBC: 3.5 10*3/uL (ref 3.4–10.8)

## 2015-05-29 LAB — BMP8+EGFR
BUN/Creatinine Ratio: 17 (ref 10–22)
BUN: 19 mg/dL (ref 8–27)
CO2: 27 mmol/L (ref 18–29)
Calcium: 9.3 mg/dL (ref 8.6–10.2)
Chloride: 103 mmol/L (ref 96–106)
Creatinine, Ser: 1.11 mg/dL (ref 0.76–1.27)
GFR calc Af Amer: 80 mL/min/{1.73_m2} (ref >59)
GFR calc non Af Amer: 69 mL/min/{1.73_m2} (ref >59)
Glucose: 96 mg/dL (ref 65–99)
Potassium: 4.1 mmol/L (ref 3.5–5.2)
Sodium: 140 mmol/L (ref 134–144)

## 2015-05-29 LAB — HEPATIC FUNCTION PANEL
ALT: 25 IU/L (ref 0–44)
AST: 24 IU/L (ref 0–40)
Albumin: 3.8 g/dL (ref 3.6–4.8)
Alkaline Phosphatase: 54 IU/L (ref 39–117)
Bilirubin Total: 0.6 mg/dL (ref 0.0–1.2)
Bilirubin, Direct: 0.17 mg/dL (ref 0.00–0.40)
Total Protein: 6.4 g/dL (ref 6.0–8.5)

## 2015-05-29 LAB — VITAMIN D 25 HYDROXY (VIT D DEFICIENCY, FRACTURES): Vit D, 25-Hydroxy: 52.1 ng/mL (ref 30.0–100.0)

## 2015-05-29 LAB — THYROID PANEL WITH TSH
FREE THYROXINE INDEX: 3.3 (ref 1.2–4.9)
T3 Uptake Ratio: 34 % (ref 24–39)
T4, Total: 9.7 ug/dL (ref 4.5–12.0)
TSH: 1.09 u[IU]/mL (ref 0.450–4.500)

## 2015-05-29 LAB — NMR, LIPOPROFILE
CHOLESTEROL: 151 mg/dL (ref 100–199)
HDL CHOLESTEROL BY NMR: 61 mg/dL (ref >39)
HDL PARTICLE NUMBER: 30.5 umol/L (ref >=30.5)
LDL Particle Number: 911 nmol/L (ref <1000)
LDL Size: 21.4 nm (ref >20.5)
LDL-C: 79 mg/dL (ref 0–99)
SMALL LDL PARTICLE NUMBER: 169 nmol/L (ref <=527)
Triglycerides by NMR: 53 mg/dL (ref 0–149)

## 2015-06-06 ENCOUNTER — Encounter: Payer: Self-pay | Admitting: Family Medicine

## 2015-06-15 ENCOUNTER — Other Ambulatory Visit: Payer: Self-pay | Admitting: Family Medicine

## 2015-09-09 ENCOUNTER — Other Ambulatory Visit: Payer: Self-pay | Admitting: Family Medicine

## 2015-09-14 ENCOUNTER — Other Ambulatory Visit: Payer: Self-pay | Admitting: Family Medicine

## 2015-10-30 ENCOUNTER — Other Ambulatory Visit: Payer: Self-pay | Admitting: Family Medicine

## 2015-11-28 ENCOUNTER — Encounter: Payer: Self-pay | Admitting: Family Medicine

## 2015-11-28 ENCOUNTER — Ambulatory Visit (INDEPENDENT_AMBULATORY_CARE_PROVIDER_SITE_OTHER): Payer: Medicare HMO | Admitting: Family Medicine

## 2015-11-28 VITALS — BP 107/64 | HR 62 | Temp 97.0°F | Ht 70.0 in | Wt 219.0 lb

## 2015-11-28 DIAGNOSIS — E8881 Metabolic syndrome: Secondary | ICD-10-CM | POA: Diagnosis not present

## 2015-11-28 DIAGNOSIS — I10 Essential (primary) hypertension: Secondary | ICD-10-CM | POA: Diagnosis not present

## 2015-11-28 DIAGNOSIS — N400 Enlarged prostate without lower urinary tract symptoms: Secondary | ICD-10-CM | POA: Diagnosis not present

## 2015-11-28 DIAGNOSIS — E559 Vitamin D deficiency, unspecified: Secondary | ICD-10-CM | POA: Diagnosis not present

## 2015-11-28 DIAGNOSIS — E785 Hyperlipidemia, unspecified: Secondary | ICD-10-CM

## 2015-11-28 DIAGNOSIS — Z800 Family history of malignant neoplasm of digestive organs: Secondary | ICD-10-CM | POA: Insufficient documentation

## 2015-11-28 DIAGNOSIS — E039 Hypothyroidism, unspecified: Secondary | ICD-10-CM

## 2015-11-28 LAB — URINALYSIS, COMPLETE
GLUCOSE, UA: NEGATIVE
KETONES UA: NEGATIVE
LEUKOCYTES UA: NEGATIVE
NITRITE UA: NEGATIVE
PROTEIN UA: NEGATIVE
RBC, UA: NEGATIVE
SPEC GRAV UA: 1.02 (ref 1.005–1.030)
Urobilinogen, Ur: 0.2 mg/dL (ref 0.2–1.0)
pH, UA: 7 (ref 5.0–7.5)

## 2015-11-28 LAB — MICROSCOPIC EXAMINATION: EPITHELIAL CELLS (NON RENAL): NONE SEEN /HPF (ref 0–10)

## 2015-11-28 MED ORDER — ATORVASTATIN CALCIUM 20 MG PO TABS: 20.0000 mg | ORAL_TABLET | Freq: Every day | ORAL | 3 refills | Status: DC

## 2015-11-28 NOTE — Patient Instructions (Addendum)
Medicare Annual Wellness Visit  [facility] and the medical providers at [facility] strive to bring you the best medical care.  In doing so we not only want to address your current medical conditions and concerns but also to detect new conditions early and prevent illness, disease and health-related problems.    Medicare offers a yearly Wellness Visit which allows our clinical staff to assess your need for preventative services including immunizations, lifestyle education, counseling to decrease risk of preventable diseases and screening for fall risk and other medical concerns.    This visit is provided free of charge (no copay) for all Medicare recipients. The clinical pharmacists at [facility] have begun to conduct these Wellness Visits which will also include a thorough review of all your medications.    As you primary medical provider recommend that you make an appointment for your Annual Wellness Visit if you have not done so already this year.  You may set up this appointment before you leave today or you may call back [phone] and schedule an appointment.  Please make sure when you call that you mention that you are scheduling your Annual Wellness Visit with the clinical pharmacist so that the appointment may be made for the proper length of time.     Continue current medications. Continue good therapeutic lifestyle changes which include good diet and exercise. Fall precautions discussed with patient. If an FOBT was given today- please return it to our front desk. If you are over 33 years old - you may need Prevnar 58 or the adult Pneumonia vaccine.   After your visit with [place] today you will receive a survey in the mail or online from [employer] regarding your care with [place]. Please take a moment to fill this out. Your feedback is very important to [place] as you can help [place] better understand your patient needs as well as  improve your experience and satisfaction. WE CARE ABOUT YOU!!!   Continue to walk and exercise regularly Reduce the intake of fluids late in the evening as this may help your nocturia Stay active, but always be careful and avoid climbing and putting yourself at risk of falling and having a fracture Your next colonoscopy will be due in October 2018 We'll we'll call you with the results of your lab work as soon as those results become available

## 2015-11-28 NOTE — Progress Notes (Signed)
Subjective:    Patient ID: Shawn Ochoa, male    DOB: [birth year], 65 y.o.   MRN: [id number]  HPI Pt here for follow up and management of chronic medical problems which includes hyperlipidemia, hypertension and hypothyroid. He is taking medications regularly.The patient is doing well with no specific complaints. He does not need any refills today. He is due to get a an EKG and routine lab work. Reviewing family history his mother is still living and in her 51s and has a history of diabetes heart disease and high blood pressure. His father is deceased as a result of hypertension and stroke. He has 2 male siblings and one male sibling and to his knowledge are all in good health. The patient does have an uncle who had colon cancer and a nephew who had colon cancer. The patient denies any chest pain palpitations shortness of breath trouble swallowing heartburn indigestion nausea vomiting diarrhea blood in the stool or black tarry bowel movements. He is passing his water well and does have nocturia. He has no burning and he has not seen any blood in the urine. His last stress test was in 2015. His last colonoscopy was in October 2015 and he will need another colonoscopy in October 2018.    Patient Active Problem List   Diagnosis Date Noted  . Hypertension 12/06/2012  . Hyperlipidemia 12/06/2012  . Hypothyroidism 12/06/2012  . Metabolic syndrome 17/51/0258  . Knee cartilage, torn, right    Outpatient Encounter Prescriptions as of 11/28/2015  Medication Sig  . aspirin 81 MG EC tablet Take 81 mg by mouth daily.    Marland Kitchen atorvastatin (LIPITOR) 40 MG tablet TAKE 1 TABLET (40 MG TOTAL) BY MOUTH DAILY. AS DIRECTED  . benazepril (LOTENSIN) 20 MG tablet TAKE 1 TABLET (20 MG TOTAL) BY MOUTH DAILY.  . Calcium Carbonate-Vitamin D (CALCIUM + D PO) Take 1 capsule by mouth daily.    . Cholecalciferol (VITAMIN D3) 5000 UNITS CAPS Take 1 capsule by mouth daily.    Marland Kitchen levothyroxine (SYNTHROID, LEVOTHROID) 75  MCG tablet TAKE 1 TABLET (75 MCG TOTAL) BY MOUTH DAILY.  Marland Kitchen Omega-3 Fatty Acids (FISH OIL) 1000 MG CAPS Take 1 capsule by mouth 2 (two) times daily.    . pioglitazone (ACTOS) 30 MG tablet TAKE 1 TABLET (30 MG TOTAL) BY MOUTH DAILY.  . [DISCONTINUED] benazepril (LOTENSIN) 20 MG tablet TAKE 1 TABLET (20 MG TOTAL) BY MOUTH DAILY.  . [DISCONTINUED] levothyroxine (SYNTHROID, LEVOTHROID) 75 MCG tablet TAKE 1 TABLET (75 MCG TOTAL) BY MOUTH DAILY.   No facility-administered encounter medications on file as of 11/28/2015.       Review of Systems  Constitutional: Negative.   HENT: Negative.   Eyes: Negative.   Respiratory: Negative.   Cardiovascular: Negative.   Gastrointestinal: Negative.   Endocrine: Negative.   Genitourinary: Negative.   Musculoskeletal: Negative.   Skin: Negative.   Allergic/Immunologic: Negative.   Neurological: Negative.   Hematological: Negative.   Psychiatric/Behavioral: Negative.        Objective:   Physical Exam  Constitutional: He is oriented to person, place, and time. He appears well-developed and well-nourished.  Pleasant and alert  HENT:  Head: Normocephalic and atraumatic.  Right Ear: External ear normal.  Left Ear: External ear normal.  Nose: Nose normal.  Mouth/Throat: Oropharynx is clear and moist. No oropharyngeal exudate.  Eyes: Conjunctivae and EOM are normal. Pupils are equal, round, and reactive to light. Right eye exhibits no discharge. Left eye exhibits no discharge. No  scleral icterus.  Neck: Normal range of motion. Neck supple. No thyromegaly present.    No bruits thyromegaly or masses or adenopathy, thyroidectomy scar  Cardiovascular: Normal rate, regular rhythm, normal heart sounds and intact distal pulses.   No murmur heard. The heart has a regular rate and rhythm at 72/m. His last stress test was 2 years ago.  Pulmonary/Chest: Effort normal and breath sounds normal. No respiratory distress. He has no wheezes. He has no rales. He  exhibits no tenderness.  Clear anteriorly and posteriorly and no axillary adenopathy  Abdominal: Soft. Bowel sounds are normal. He exhibits no mass. There is no tenderness. There is no rebound and no guarding.  There is some abdominal obesity. There were no masses bruits organ enlargement or inguinal adenopathy  Genitourinary: Rectum normal and penis normal.  Genitourinary Comments: The prostate is enlarged but soft and smooth. There were no rectal masses. Are no inguinal hernias. External genitalia were within normal limits.  Musculoskeletal: Normal range of motion. He exhibits no edema or tenderness.  Lymphadenopathy:    He has no cervical adenopathy.  Neurological: He is alert and oriented to person, place, and time. He has normal reflexes. No cranial nerve deficit.  Skin: Skin is warm and dry. No rash noted.  Psychiatric: He has a normal mood and affect. His behavior is normal. Judgment and thought content normal.  Nursing note and vitals reviewed.  BP 107/64 (BP Location: Left Arm)   Pulse 62   Temp 97 F (36.1 C) (Oral)   Ht _0  (1.778 m)   Wt 219 lb (99.3 kg)   BMI 31.42 kg/m   EKG: Within normal limits    Assessment & Plan:  1. Hyperlipidemia -Continue atorvastatin and aggressive therapeutic lifestyle changes pending results of lab work - CBC with Differential/Platelet - NMR, lipoprofile - EKG 12-Lead  2. Hypothyroidism, unspecified hypothyroidism type -Continue current thyroid treatment pending results of lab work - CBC with Differential/Platelet - Thyroid Panel With TSH  3. Essential hypertension -The blood pressure remains good. He should continue with his current treatment and sodium restriction - BMP8+EGFR - CBC with Differential/Platelet - Hepatic function panel - EKG 12-Lead  4. Vitamin D deficiency -Continue with current treatment pending results of lab work - CBC with Differential/Platelet - VITAMIN D 25 Hydroxy (Vit-D Deficiency, Fractures)  5.  BPH (benign prostatic hypertrophy) -The prostate is enlarged. He has no symptoms other than some nocturia and he is okay with this at the present time. He was given the option of referral back to the urologist if it becomes a problem with too many awakenings at night - CBC with Differential/Platelet - PSA, total and free - Urinalysis, Complete  6. Family history of colon cancer -He has an uncle and a nephew that have both had colon cancer. His next colonoscopy is due in October 2018  7. Metabolic syndrome -Patient will continue with his exercise regimen and all efforts to lose weight. He will continue with his Actos  Patient Instructions                       Medicare Annual Wellness Visit  [facility] and the medical providers at [facility] strive to bring you the best medical care.  In doing so we not only want to address your current medical conditions and concerns but also to detect new conditions early and prevent illness, disease and health-related problems.    Medicare offers a yearly Wellness Visit  which allows our clinical staff to assess your need for preventative services including immunizations, lifestyle education, counseling to decrease risk of preventable diseases and screening for fall risk and other medical concerns.    This visit is provided free of charge (no copay) for all Medicare recipients. The clinical pharmacists at [facility] have begun to conduct these Wellness Visits which will also include a thorough review of all your medications.    As you primary medical provider recommend that you make an appointment for your Annual Wellness Visit if you have not done so already this year.  You may set up this appointment before you leave today or you may call back ([phone]) and schedule an appointment.  Please make sure when you call that you mention that you are scheduling your Annual Wellness Visit with the clinical pharmacist  so that the appointment may be made for the proper length of time.     Continue current medications. Continue good therapeutic lifestyle changes which include good diet and exercise. Fall precautions discussed with patient. If an FOBT was given today- please return it to our front desk. If you are over 25 years old - you may need Prevnar 58 or the adult Pneumonia vaccine.   After your visit with [place] today you will receive a survey in the mail or online from [employer] regarding your care with [place]. Please take a moment to fill this out. Your feedback is very important to [place] as you can help [place] better understand your patient needs as well as improve your experience and satisfaction. WE CARE ABOUT YOU!!!   Continue to walk and exercise regularly Reduce the intake of fluids late in the evening as this may help your nocturia Stay active, but always be careful and avoid climbing and putting yourself at risk of falling and having a fracture Your next colonoscopy will be due in October 2018 We'll we'll call you with the results of your lab work as soon as those results become available            Arrie Senate MD

## 2015-11-29 LAB — CBC WITH DIFFERENTIAL/PLATELET
BASOS ABS: 0 10*3/uL (ref 0.0–0.2)
Basos: 1 %
EOS (ABSOLUTE): 0.2 10*3/uL (ref 0.0–0.4)
Eos: 4 %
HEMOGLOBIN: 13.4 g/dL (ref 12.6–17.7)
Hematocrit: 40.1 % (ref 37.5–51.0)
IMMATURE GRANS (ABS): 0 10*3/uL (ref 0.0–0.1)
Immature Granulocytes: 1 %
LYMPHS ABS: 1.3 10*3/uL (ref 0.7–3.1)
LYMPHS: 32 %
MCH: 30.7 pg (ref 26.6–33.0)
MCHC: 33.4 g/dL (ref 31.5–35.7)
MCV: 92 fL (ref 79–97)
MONOCYTES: 12 %
Monocytes Absolute: 0.5 10*3/uL (ref 0.1–0.9)
NEUTROS PCT: 50 %
Neutrophils Absolute: 2.2 10*3/uL (ref 1.4–7.0)
Platelets: 256 10*3/uL (ref 150–379)
RBC: 4.37 x10E6/uL (ref 4.14–5.80)
RDW: 14.3 % (ref 12.3–15.4)
WBC: 4.3 10*3/uL (ref 3.4–10.8)

## 2015-11-29 LAB — PSA, TOTAL AND FREE
PSA FREE: 0.51 ng/mL
PSA, Free Pct: 24.3 %
Prostate Specific Ag, Serum: 2.1 ng/mL (ref 0.0–4.0)

## 2015-11-29 LAB — NMR, LIPOPROFILE
Cholesterol: 155 mg/dL (ref 100–199)
HDL Cholesterol by NMR: 63 mg/dL (ref >39)
HDL Particle Number: 32.5 umol/L (ref >=30.5)
LDL PARTICLE NUMBER: 826 nmol/L (ref <1000)
LDL SIZE: 21.8 nm (ref >20.5)
LDL-C: 81 mg/dL (ref 0–99)
Small LDL Particle Number: 96 nmol/L (ref <=527)
Triglycerides by NMR: 56 mg/dL (ref 0–149)

## 2015-11-29 LAB — BMP8+EGFR
BUN/Creatinine Ratio: 19 (ref 10–24)
BUN: 22 mg/dL (ref 8–27)
CALCIUM: 9 mg/dL (ref 8.6–10.2)
CO2: 21 mmol/L (ref 18–29)
Chloride: 106 mmol/L (ref 96–106)
Creatinine, Ser: 1.15 mg/dL (ref 0.76–1.27)
GFR calc Af Amer: 76 mL/min/{1.73_m2} (ref >59)
GFR calc non Af Amer: 65 mL/min/{1.73_m2} (ref >59)
GLUCOSE: 97 mg/dL (ref 65–99)
Potassium: 4.2 mmol/L (ref 3.5–5.2)
Sodium: 143 mmol/L (ref 134–144)

## 2015-11-29 LAB — THYROID PANEL WITH TSH
Free Thyroxine Index: 2.6 (ref 1.2–4.9)
T3 Uptake Ratio: 31 % (ref 24–39)
T4 TOTAL: 8.3 ug/dL (ref 4.5–12.0)
TSH: 1.58 u[IU]/mL (ref 0.450–4.500)

## 2015-11-29 LAB — HEPATIC FUNCTION PANEL
ALBUMIN: 4 g/dL (ref 3.6–4.8)
ALK PHOS: 52 IU/L (ref 39–117)
ALT: 21 IU/L (ref 0–44)
AST: 26 IU/L (ref 0–40)
Bilirubin Total: 0.7 mg/dL (ref 0.0–1.2)
Bilirubin, Direct: 0.19 mg/dL (ref 0.00–0.40)
TOTAL PROTEIN: 6.6 g/dL (ref 6.0–8.5)

## 2015-11-29 LAB — VITAMIN D 25 HYDROXY (VIT D DEFICIENCY, FRACTURES): VIT D 25 HYDROXY: 58.4 ng/mL (ref 30.0–100.0)

## 2015-12-09 ENCOUNTER — Other Ambulatory Visit: Payer: Self-pay | Admitting: Family Medicine

## 2015-12-10 ENCOUNTER — Other Ambulatory Visit: Payer: Self-pay | Admitting: Family Medicine

## 2016-01-21 ENCOUNTER — Encounter: Payer: Self-pay | Admitting: *Deleted

## 2016-06-04 ENCOUNTER — Other Ambulatory Visit: Payer: Self-pay | Admitting: Family Medicine

## 2016-06-04 ENCOUNTER — Ambulatory Visit: Payer: Medicare HMO | Admitting: Family Medicine

## 2016-06-18 ENCOUNTER — Other Ambulatory Visit: Payer: Self-pay | Admitting: Family Medicine

## 2016-06-18 ENCOUNTER — Encounter: Payer: Self-pay | Admitting: Family Medicine

## 2016-06-18 ENCOUNTER — Ambulatory Visit (INDEPENDENT_AMBULATORY_CARE_PROVIDER_SITE_OTHER): Payer: Medicare HMO | Admitting: Family Medicine

## 2016-06-18 VITALS — BP 130/80 | HR 69 | Temp 97.3°F | Ht 70.0 in | Wt 220.0 lb

## 2016-06-18 DIAGNOSIS — I10 Essential (primary) hypertension: Secondary | ICD-10-CM | POA: Diagnosis not present

## 2016-06-18 DIAGNOSIS — E7800 Pure hypercholesterolemia, unspecified: Secondary | ICD-10-CM

## 2016-06-18 DIAGNOSIS — Z800 Family history of malignant neoplasm of digestive organs: Secondary | ICD-10-CM

## 2016-06-18 DIAGNOSIS — E8881 Metabolic syndrome: Secondary | ICD-10-CM

## 2016-06-18 DIAGNOSIS — Z23 Encounter for immunization: Secondary | ICD-10-CM

## 2016-06-18 DIAGNOSIS — E559 Vitamin D deficiency, unspecified: Secondary | ICD-10-CM

## 2016-06-18 MED ORDER — BENAZEPRIL HCL 20 MG PO TABS: ORAL_TABLET | ORAL | 3 refills | Status: DC

## 2016-06-18 MED ORDER — PIOGLITAZONE HCL 30 MG PO TABS: ORAL_TABLET | ORAL | 3 refills | Status: DC

## 2016-06-18 NOTE — Progress Notes (Signed)
Subjective:    Patient ID: Shawn Ochoa, male    DOB: [birth year], 66 y.o.   MRN: [id number]  HPI Pt here for follow up and management of chronic medical problems which includes hyperlipidemia and hypertension. He is taking medication regularly.The patient is doing well overall. We see him every 6 months. He is requesting refills on his blood pressure medicine and Actos. He is also due and will receive his Pneumovax today. He will get lab work done today and will be given an FOBT to return. His last colonoscopy was 2-1/2 years ago. The patient does complain of some slight heartburn but rarely. He is been extremely busy with his work trying to run 2 jobs. He has not had a lot of exercise and plans to make more efforts at getting exercise. He will get his Pneumovax today. He will get lab work today. He denies any chest pain or shortness of breath anymore than usual. He has no nausea vomiting diarrhea blood in the stool or black tarry bowel movements. He is due to his next colonoscopy in October 2020. He's passing his water well but does have some slight frequency.    Patient Active Problem List   Diagnosis Date Noted  . Family history of colon cancer 11/28/2015  . Hypertension 12/06/2012  . Hyperlipidemia 12/06/2012  . Hypothyroidism 12/06/2012  . Metabolic syndrome 03/83/3383  . Knee cartilage, torn, right    Outpatient Encounter Prescriptions as of 06/18/2016  Medication Sig  . aspirin 81 MG EC tablet Take 81 mg by mouth daily.    Marland Kitchen atorvastatin (LIPITOR) 20 MG tablet Take 1 tablet (20 mg total) by mouth daily.  . benazepril (LOTENSIN) 20 MG tablet TAKE 1 TABLET (20 MG TOTAL) BY MOUTH DAILY.  . Calcium Carbonate-Vitamin D (CALCIUM + D PO) Take 1 capsule by mouth daily.    . Cholecalciferol (VITAMIN D3) 5000 UNITS CAPS Take 1 capsule by mouth daily.    Marland Kitchen levothyroxine (SYNTHROID, LEVOTHROID) 75 MCG tablet TAKE 1 TABLET (75 MCG TOTAL) BY MOUTH DAILY.  Marland Kitchen Omega-3 Fatty Acids (FISH OIL)  1000 MG CAPS Take 1 capsule by mouth 2 (two) times daily.    . pioglitazone (ACTOS) 30 MG tablet TAKE 1 TABLET (30 MG TOTAL) BY MOUTH DAILY.  . [DISCONTINUED] benazepril (LOTENSIN) 20 MG tablet TAKE 1 TABLET (20 MG TOTAL) BY MOUTH DAILY.  . [DISCONTINUED] pioglitazone (ACTOS) 30 MG tablet TAKE 1 TABLET (30 MG TOTAL) BY MOUTH DAILY.  . [DISCONTINUED] benazepril (LOTENSIN) 20 MG tablet TAKE 1 TABLET (20 MG TOTAL) BY MOUTH DAILY.  . [DISCONTINUED] benazepril (LOTENSIN) 20 MG tablet TAKE 1 TABLET (20 MG TOTAL) BY MOUTH DAILY.  . [DISCONTINUED] levothyroxine (SYNTHROID, LEVOTHROID) 75 MCG tablet TAKE 1 TABLET (75 MCG TOTAL) BY MOUTH DAILY.   No facility-administered encounter medications on file as of 06/18/2016.       Review of Systems  Constitutional: Negative.   HENT: Negative.   Eyes: Negative.   Respiratory: Negative.   Cardiovascular: Negative.   Gastrointestinal: Negative.   Endocrine: Negative.   Genitourinary: Negative.   Musculoskeletal: Negative.   Skin: Negative.   Allergic/Immunologic: Negative.   Neurological: Negative.   Hematological: Negative.   Psychiatric/Behavioral: Negative.        Objective:   Physical Exam  Constitutional: He is oriented to person, place, and time. He appears well-developed and well-nourished. No distress.  HENT:  Head: Normocephalic and atraumatic.  Right Ear: External ear normal.  Left Ear: External ear normal.  Nose:  Nose normal.  Mouth/Throat: Oropharynx is clear and moist. No oropharyngeal exudate.  Eyes: Conjunctivae and EOM are normal. Pupils are equal, round, and reactive to light. Right eye exhibits no discharge. Left eye exhibits no discharge. No scleral icterus.  Neck: Normal range of motion. Neck supple. No thyromegaly present.  Bruits or thyromegaly are not present  Cardiovascular: Normal rate, regular rhythm, normal heart sounds and intact distal pulses.   No murmur heard. Heart was regular at 72/m  Pulmonary/Chest: Effort  normal and breath sounds normal. No respiratory distress. He has no wheezes. He has no rales. He exhibits no tenderness.  Clear anteriorly and posteriorly and no axillary adenopathy  Abdominal: Soft. Bowel sounds are normal. He exhibits no mass. There is no tenderness. There is no rebound and no guarding.  There is abdominal obesity and the patient is aware that he needs to lose some weight. A no masses tenderness or organ enlargement or bruits. There was no epigastric tenderness.  Musculoskeletal: Normal range of motion. He exhibits no edema or tenderness.  Lymphadenopathy:    He has no cervical adenopathy.  Neurological: He is alert and oriented to person, place, and time. He has normal reflexes. No cranial nerve deficit.  Skin: Skin is warm and dry. No rash noted.  Psychiatric: He has a normal mood and affect. His behavior is normal. Judgment and thought content normal.  Nursing note and vitals reviewed.  BP (!) 139/91 (BP Location: Left Arm)   Pulse 69   Temp 97.3 F (36.3 C) (Oral)   Ht '5\' 10"'  (1.778 m)   Wt 220 lb (99.8 kg)   BMI 31.57 kg/m         Assessment & Plan:  1. Pure hypercholesterolemia -Continue with aggressive therapeutic lifestyle changes and current cholesterol medicine pending results of lab work - CBC with Differential/Platelet - NMR, lipoprofile  2. Essential hypertension -The repeat blood pressure was good today using a large cuff on the right arm at 130/80 - BMP8+EGFR - CBC with Differential/Platelet - Hepatic function panel  3. Vitamin D deficiency -[place] current treatment pending results of lab work - CBC with Differential/Platelet - VITAMIN D 25 Hydroxy (Vit-D Deficiency, Fractures)  4. Family history of colon cancer -Next colonoscopy will be due in the fall of 2020 - CBC with Differential/Platelet  5. Metabolic syndrome -Continue with aggressive therapeutic lifestyle changes and weight loss and Actos. - CBC with  Differential/Platelet  Meds ordered this encounter  Medications  . pioglitazone (ACTOS) 30 MG tablet    Sig: TAKE 1 TABLET (30 MG TOTAL) BY MOUTH DAILY.    Dispense:  90 tablet    Refill:  3  . benazepril (LOTENSIN) 20 MG tablet    Sig: TAKE 1 TABLET (20 MG TOTAL) BY MOUTH DAILY.    Dispense:  90 tablet    Refill:  3   Patient Instructions                       Medicare Annual Wellness Visit  [facility] and the medical providers at [facility] strive to bring you the best medical care.  In doing so we not only want to address your current medical conditions and concerns but also to detect new conditions early and prevent illness, disease and health-related problems.    Medicare offers a yearly Wellness Visit which allows our clinical staff to assess your need for preventative services including immunizations, lifestyle education, counseling to decrease risk of preventable  diseases and screening for fall risk and other medical concerns.    This visit is provided free of charge (no copay) for all Medicare recipients. The clinical pharmacists at [facility] have begun to conduct these Wellness Visits which will also include a thorough review of all your medications.    As you primary medical provider recommend that you make an appointment for your Annual Wellness Visit if you have not done so already this year.  You may set up this appointment before you leave today or you may call back ([phone]) and schedule an appointment.  Please make sure when you call that you mention that you are scheduling your Annual Wellness Visit with the clinical pharmacist so that the appointment may be made for the proper length of time.     Continue current medications. Continue good therapeutic lifestyle changes which include good diet and exercise. Fall precautions discussed with patient. If an FOBT was given today- please return it to our front desk. If you  are over 32 years old - you may need Prevnar 76 or the adult Pneumonia vaccine.  **Flu shots are available--- please call and schedule a FLU-CLINIC appointment**  After your visit with [place] today you will receive a survey in the mail or online from [employer] regarding your care with [place]. Please take a moment to fill this out. Your feedback is very important to [place] as you can help [place] better understand your patient needs as well as improve your experience and satisfaction. WE CARE ABOUT YOU!!!   Make a special effort to get more exercise Drink plenty of water and fluids Try to lose some weight Monitor blood pressure readings at home and bring these by for review in 3-4 weeks Continue to watch sodium intake Your need her next colonoscopy in October 2020 We will check when you had your last stress test and because you have risk factors for heart disease we'll make sure that we get another stress test on you    Arrie Senate MD

## 2016-06-18 NOTE — Patient Instructions (Addendum)
Medicare Annual Wellness Visit  [facility] and the medical providers at [facility] strive to bring you the best medical care.  In doing so we not only want to address your current medical conditions and concerns but also to detect new conditions early and prevent illness, disease and health-related problems.    Medicare offers a yearly Wellness Visit which allows our clinical staff to assess your need for preventative services including immunizations, lifestyle education, counseling to decrease risk of preventable diseases and screening for fall risk and other medical concerns.    This visit is provided free of charge (no copay) for all Medicare recipients. The clinical pharmacists at [facility] have begun to conduct these Wellness Visits which will also include a thorough review of all your medications.    As you primary medical provider recommend that you make an appointment for your Annual Wellness Visit if you have not done so already this year.  You may set up this appointment before you leave today or you may call back [phone]) and schedule an appointment.  Please make sure when you call that you mention that you are scheduling your Annual Wellness Visit with the clinical pharmacist so that the appointment may be made for the proper length of time.     Continue current medications. Continue good therapeutic lifestyle changes which include good diet and exercise. Fall precautions discussed with patient. If an FOBT was given today- please return it to our front desk. If you are over 48 years old - you may need Prevnar 66 or the adult Pneumonia vaccine.  **Flu shots are available--- please call and schedule a FLU-CLINIC appointment**  After your visit with [place] today you will receive a survey in the mail or online from [employer] regarding your care with [place]. Please take a moment to fill this out. Your feedback is very  important to [place] as you can help [place] better understand your patient needs as well as improve your experience and satisfaction. WE CARE ABOUT YOU!!!   Make a special effort to get more exercise Drink plenty of water and fluids Try to lose some weight Monitor blood pressure readings at home and bring these by for review in 3-4 weeks Continue to watch sodium intake Your need her next colonoscopy in October 2020 We will check when you had your last stress test and because you have risk factors for heart disease we'll make sure that we get another stress test on you If the patient continues to have heartburn, he can purchase ranitidine, the equate brand, over-the-counter 150 mg and take one twice daily before breakfast and supper

## 2016-06-19 LAB — HEPATIC FUNCTION PANEL
ALBUMIN: 3.9 g/dL (ref 3.6–4.8)
ALT: 24 IU/L (ref 0–44)
AST: 23 IU/L (ref 0–40)
Alkaline Phosphatase: 56 IU/L (ref 39–117)
BILIRUBIN TOTAL: 0.6 mg/dL (ref 0.0–1.2)
BILIRUBIN, DIRECT: 0.18 mg/dL (ref 0.00–0.40)
TOTAL PROTEIN: 6.8 g/dL (ref 6.0–8.5)

## 2016-06-19 LAB — CBC WITH DIFFERENTIAL/PLATELET
BASOS: 0 %
Basophils Absolute: 0 10*3/uL (ref 0.0–0.2)
EOS (ABSOLUTE): 0.1 10*3/uL (ref 0.0–0.4)
EOS: 2 %
Hematocrit: 40.7 % (ref 37.5–51.0)
Hemoglobin: 13.4 g/dL (ref 13.0–17.7)
IMMATURE GRANS (ABS): 0 10*3/uL (ref 0.0–0.1)
IMMATURE GRANULOCYTES: 0 %
LYMPHS: 34 %
Lymphocytes Absolute: 1.6 10*3/uL (ref 0.7–3.1)
MCH: 30.6 pg (ref 26.6–33.0)
MCHC: 32.9 g/dL (ref 31.5–35.7)
MCV: 93 fL (ref 79–97)
Monocytes Absolute: 0.5 10*3/uL (ref 0.1–0.9)
Monocytes: 11 %
NEUTROS PCT: 53 %
Neutrophils Absolute: 2.4 10*3/uL (ref 1.4–7.0)
PLATELETS: 276 10*3/uL (ref 150–379)
RBC: 4.38 x10E6/uL (ref 4.14–5.80)
RDW: 13.9 % (ref 12.3–15.4)
WBC: 4.5 10*3/uL (ref 3.4–10.8)

## 2016-06-19 LAB — NMR, LIPOPROFILE
Cholesterol: 174 mg/dL (ref 100–199)
HDL Cholesterol by NMR: 64 mg/dL (ref >39)
HDL Particle Number: 31 umol/L (ref >=30.5)
LDL PARTICLE NUMBER: 912 nmol/L (ref <1000)
LDL SIZE: 21.7 nm (ref >20.5)
LDL-C: 98 mg/dL (ref 0–99)
Small LDL Particle Number: 226 nmol/L (ref <=527)
TRIGLYCERIDES BY NMR: 58 mg/dL (ref 0–149)

## 2016-06-19 LAB — VITAMIN D 25 HYDROXY (VIT D DEFICIENCY, FRACTURES): Vit D, 25-Hydroxy: 52.7 ng/mL (ref 30.0–100.0)

## 2016-06-19 LAB — BMP8+EGFR
BUN/Creatinine Ratio: 19 (ref 10–24)
BUN: 22 mg/dL (ref 8–27)
CALCIUM: 9.4 mg/dL (ref 8.6–10.2)
CO2: 27 mmol/L (ref 18–29)
CREATININE: 1.18 mg/dL (ref 0.76–1.27)
Chloride: 100 mmol/L (ref 96–106)
GFR calc Af Amer: 73 mL/min/{1.73_m2} (ref >59)
GFR, EST NON AFRICAN AMERICAN: 63 mL/min/{1.73_m2} (ref >59)
GLUCOSE: 96 mg/dL (ref 65–99)
POTASSIUM: 4.2 mmol/L (ref 3.5–5.2)
Sodium: 140 mmol/L (ref 134–144)

## 2016-07-16 ENCOUNTER — Other Ambulatory Visit: Payer: Medicare HMO

## 2016-07-16 DIAGNOSIS — Z1211 Encounter for screening for malignant neoplasm of colon: Secondary | ICD-10-CM

## 2016-07-18 LAB — FECAL OCCULT BLOOD, IMMUNOCHEMICAL: Fecal Occult Bld: NEGATIVE

## 2016-08-23 ENCOUNTER — Other Ambulatory Visit: Payer: Self-pay | Admitting: Family Medicine

## 2016-09-18 DIAGNOSIS — D1801 Hemangioma of skin and subcutaneous tissue: Secondary | ICD-10-CM | POA: Diagnosis not present

## 2016-09-18 DIAGNOSIS — L853 Xerosis cutis: Secondary | ICD-10-CM | POA: Diagnosis not present

## 2016-09-18 DIAGNOSIS — L821 Other seborrheic keratosis: Secondary | ICD-10-CM | POA: Diagnosis not present

## 2016-09-18 DIAGNOSIS — D225 Melanocytic nevi of trunk: Secondary | ICD-10-CM | POA: Diagnosis not present

## 2016-09-18 DIAGNOSIS — Z85828 Personal history of other malignant neoplasm of skin: Secondary | ICD-10-CM | POA: Diagnosis not present

## 2016-11-20 ENCOUNTER — Other Ambulatory Visit: Payer: Self-pay | Admitting: Family Medicine

## 2016-11-26 ENCOUNTER — Other Ambulatory Visit: Payer: Self-pay | Admitting: Family Medicine

## 2016-11-26 DIAGNOSIS — Z961 Presence of intraocular lens: Secondary | ICD-10-CM | POA: Diagnosis not present

## 2016-11-26 DIAGNOSIS — H04123 Dry eye syndrome of bilateral lacrimal glands: Secondary | ICD-10-CM | POA: Diagnosis not present

## 2016-11-26 DIAGNOSIS — H26493 Other secondary cataract, bilateral: Secondary | ICD-10-CM | POA: Diagnosis not present

## 2016-11-26 DIAGNOSIS — H10413 Chronic giant papillary conjunctivitis, bilateral: Secondary | ICD-10-CM | POA: Diagnosis not present

## 2016-11-26 DIAGNOSIS — H1851 Endothelial corneal dystrophy: Secondary | ICD-10-CM | POA: Diagnosis not present

## 2016-11-27 ENCOUNTER — Other Ambulatory Visit: Payer: Self-pay | Admitting: Family Medicine

## 2016-11-28 NOTE — Telephone Encounter (Signed)
Last thyroid 11/28/15  DWM

## 2016-12-09 ENCOUNTER — Encounter: Payer: Self-pay | Admitting: *Deleted

## 2016-12-19 ENCOUNTER — Ambulatory Visit: Payer: Medicare HMO | Admitting: Family Medicine

## 2017-01-06 ENCOUNTER — Ambulatory Visit (INDEPENDENT_AMBULATORY_CARE_PROVIDER_SITE_OTHER): Payer: Medicare HMO | Admitting: Family Medicine

## 2017-01-06 ENCOUNTER — Encounter: Payer: Self-pay | Admitting: Family Medicine

## 2017-01-06 VITALS — BP 118/75 | HR 70 | Temp 97.2°F | Ht 70.0 in | Wt 223.0 lb

## 2017-01-06 DIAGNOSIS — Z1159 Encounter for screening for other viral diseases: Secondary | ICD-10-CM | POA: Diagnosis not present

## 2017-01-06 DIAGNOSIS — Z0000 Encounter for general adult medical examination without abnormal findings: Secondary | ICD-10-CM | POA: Diagnosis not present

## 2017-01-06 DIAGNOSIS — E7800 Pure hypercholesterolemia, unspecified: Secondary | ICD-10-CM | POA: Diagnosis not present

## 2017-01-06 DIAGNOSIS — Z23 Encounter for immunization: Secondary | ICD-10-CM | POA: Diagnosis not present

## 2017-01-06 DIAGNOSIS — R638 Other symptoms and signs concerning food and fluid intake: Secondary | ICD-10-CM

## 2017-01-06 DIAGNOSIS — I10 Essential (primary) hypertension: Secondary | ICD-10-CM | POA: Diagnosis not present

## 2017-01-06 DIAGNOSIS — E559 Vitamin D deficiency, unspecified: Secondary | ICD-10-CM | POA: Diagnosis not present

## 2017-01-06 DIAGNOSIS — Z125 Encounter for screening for malignant neoplasm of prostate: Secondary | ICD-10-CM | POA: Diagnosis not present

## 2017-01-06 DIAGNOSIS — N400 Enlarged prostate without lower urinary tract symptoms: Secondary | ICD-10-CM

## 2017-01-06 DIAGNOSIS — Z800 Family history of malignant neoplasm of digestive organs: Secondary | ICD-10-CM

## 2017-01-06 DIAGNOSIS — B3749 Other urogenital candidiasis: Secondary | ICD-10-CM

## 2017-01-06 DIAGNOSIS — E8881 Metabolic syndrome: Secondary | ICD-10-CM

## 2017-01-06 LAB — MICROSCOPIC EXAMINATION: Renal Epithel, UA: NONE SEEN /hpf

## 2017-01-06 LAB — URINALYSIS, COMPLETE
Bilirubin, UA: NEGATIVE
GLUCOSE, UA: NEGATIVE
KETONES UA: NEGATIVE
Nitrite, UA: NEGATIVE
PROTEIN UA: NEGATIVE
SPEC GRAV UA: 1.025 (ref 1.005–1.030)
Urobilinogen, Ur: 0.2 mg/dL (ref 0.2–1.0)
pH, UA: 6 (ref 5.0–7.5)

## 2017-01-06 MED ORDER — FLUTICASONE PROPIONATE 50 MCG/ACT NA SUSP: 2.0000 | Freq: Every day | NASAL | 11 refills | Status: DC

## 2017-01-06 NOTE — Patient Instructions (Addendum)
Medicare Annual Wellness Visit  [facility] and the medical providers at [facility] strive to bring you the best medical care.  In doing so we not only want to address your current medical conditions and concerns but also to detect new conditions early and prevent illness, disease and health-related problems.    Medicare offers a yearly Wellness Visit which allows our clinical staff to assess your need for preventative services including immunizations, lifestyle education, counseling to decrease risk of preventable diseases and screening for fall risk and other medical concerns.    This visit is provided free of charge (no copay) for all Medicare recipients. The clinical pharmacists at [facility] have begun to conduct these Wellness Visits which will also include a thorough review of all your medications.    As you primary medical provider recommend that you make an appointment for your Annual Wellness Visit if you have not done so already this year.  You may set up this appointment before you leave today or you may call back ([phone]) and schedule an appointment.  Please make sure when you call that you mention that you are scheduling your Annual Wellness Visit with the clinical pharmacist so that the appointment may be made for the proper length of time.     Continue current medications. Continue good therapeutic lifestyle changes which include good diet and exercise. Fall precautions discussed with patient. If an FOBT was given today- please return it to our front desk. If you are over 39 years old - you may need Prevnar 56 or the adult Pneumonia vaccine.  **Flu shots are available--- please call and schedule a FLU-CLINIC appointment**  After your visit with [place] today you will receive a survey in the mail or online from [employer] regarding your care with [place]. Please take a moment to fill this out. Your feedback is very  important to [place] as you can help [place] better understand your patient needs as well as improve your experience and satisfaction. WE CARE ABOUT YOU!!!   The patient will be due his next colonoscopy in October 2020 He should get another visit to the cardiologist about that time as it will be 5 years since the last visit to the cardiologist With the lab work today we will also do a hepatitis C test to make sure that he is hepatitis C negative. He should check with his insurance regarding the new shingles shot or shingrix This should cover all of the health maintenance issues that are necessary for him moving forward. He will get his flu shot today. He had his eye exam this month.

## 2017-01-06 NOTE — Progress Notes (Signed)
Subjective:    Patient ID: Shawn Ochoa, male    DOB: [birth year], 66 y.o.   MRN: [id number]  HPI  Patient is here today for annual wellness exam and follow up of chronic medical problems which includes hyperlipidemia and hypertension. He is taking medication regularly.The patient is doing well overall. He continues to stay active. On reviewing his record he did have a stress test by the cardiologist in 2015 and we should plan to get another one 5 years from then. His also due to his next colonoscopy in 2020. We will check with his insurance regarding the new shingles vaccine. We will add a hepatitis C profile to his blood work today. We'll also give him his flu shot today. The patient has no particular complaints. 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 Over the past year he did lose his mother from a combination of heart disease and age-related issues. The patient denies any chest pain or shortness of breath. He recently had a lot of head congestion and drainage and maybe a low-grade fever but this seems to be getting better and may not be quite all gone but he is feeling better with this. He denies any trouble with nausea vomiting diarrhea blood in the stool or black tarry bowel movements. He is passing his water without problems but has a slower stream and more frequency. He is due for his next colonoscopy in 2020.     Patient Active Problem List   Diagnosis Date Noted  . Family history of colon cancer 11/28/2015  . Hypertension 12/06/2012  . Hyperlipidemia 12/06/2012  . Hypothyroidism 12/06/2012  . Metabolic syndrome 53/64/6803  . Knee cartilage, torn, right    Outpatient Encounter Prescriptions as of 01/06/2017  Medication Sig  . aspirin 81 MG EC tablet Take 81 mg by mouth daily.    Marland Kitchen atorvastatin (LIPITOR) 20 MG tablet TAKE 1 TABLET (20 MG TOTAL) BY MOUTH DAILY.  . benazepril  (LOTENSIN) 20 MG tablet TAKE 1 TABLET (20 MG TOTAL) BY MOUTH DAILY.  . Calcium Carbonate-Vitamin D (CALCIUM + D PO) Take 1 capsule by mouth daily.    . Cholecalciferol (VITAMIN D3) 5000 UNITS CAPS Take 1 capsule by mouth daily.    Marland Kitchen levothyroxine (SYNTHROID, LEVOTHROID) 75 MCG tablet TAKE 1 TABLET (75 MCG TOTAL) BY MOUTH DAILY.  Marland Kitchen Omega-3 Fatty Acids (FISH OIL) 1000 MG CAPS Take 1 capsule by mouth 2 (two) times daily.    . pioglitazone (ACTOS) 30 MG tablet TAKE 1 TABLET (30 MG TOTAL) BY MOUTH DAILY.  . [DISCONTINUED] levothyroxine (SYNTHROID, LEVOTHROID) 75 MCG tablet TAKE 1 TABLET (75 MCG TOTAL) BY MOUTH DAILY.  . [DISCONTINUED] pioglitazone (ACTOS) 30 MG tablet TAKE 1 TABLET (30 MG TOTAL) BY MOUTH DAILY.   No facility-administered encounter medications on file as of 01/06/2017.       Review of Systems  Constitutional: Negative.   HENT: Negative.   Eyes: Negative.   Respiratory: Negative.   Cardiovascular: Negative.   Gastrointestinal: Negative.   Endocrine: Negative.   Genitourinary: Negative.   Musculoskeletal: Negative.   Skin: Negative.   Allergic/Immunologic: Negative.   Neurological: Negative.   Hematological: Negative.   Psychiatric/Behavioral: Negative.        Objective:   Physical Exam  Constitutional: He is oriented to person, place, and time. He appears well-developed and well-nourished. No distress.  The patient is pleasant and alert.  HENT:  Head: Normocephalic and atraumatic.  Right Ear: External ear normal.  Left Ear: External ear normal.  Mouth/Throat: Oropharynx is clear and moist. No oropharyngeal exudate.  Minimal nasal congestion  Eyes: Pupils are equal, round, and reactive to light. Conjunctivae and EOM are normal. Right eye exhibits no discharge. Left eye exhibits no discharge. No scleral icterus.  Neck: Normal range of motion. Neck supple. No thyromegaly present.  No bruits thyromegaly or anterior cervical adenopathy. There is a front midline scar  from his thyroidectomy.  Cardiovascular: Normal rate, regular rhythm, normal heart sounds and intact distal pulses.   No murmur heard. The heart has a regular rate and rhythm at 72/m  Pulmonary/Chest: Effort normal and breath sounds normal. No respiratory distress. He has no wheezes. He has no rales. He exhibits no tenderness.  No axillary adenopathy. No chest wall masses. The lungs were clear anteriorly and posteriorly.  Abdominal: Soft. Bowel sounds are normal. He exhibits no mass. There is no tenderness. There is no rebound and no guarding.  The abdomen is somewhat obese without masses tenderness or organ enlargement or bruits. No inguinal adenopathy.  Genitourinary: Rectum normal and penis normal.  Genitourinary Comments: The prostate is enlarged but soft and smooth. There were no rectal masses present. There is no inguinal hernias palpable. The external genitalia were within normal limits. The glans penis does have a lot of redness under the foreskin and the patient will try some Monistat cream on this to see if he can help relieve this by applying it twice daily.  Musculoskeletal: Normal range of motion. He exhibits no edema.  The patient does complain of some foot pain in morning's stiffness that seems to get better once he is up and moving. There is no obvious abnormalities noted on the exam today.  Lymphadenopathy:    He has no cervical adenopathy.  Neurological: He is alert and oriented to person, place, and time. He has normal reflexes. No cranial nerve deficit.  Skin: Skin is warm and dry. No rash noted.  Redness around the glans penis for which the patient will use some Monistat cream twice daily and give [place] a call back in a couple weeks if this is not better.  Psychiatric: He has a normal mood and affect. His behavior is normal. Judgment and thought content normal.  Nursing note and vitals reviewed.  BP 118/75 (BP Location: Left Arm)   Pulse 70   Temp (!) 97.2 F (36.2 C) (Oral)    Ht _0  (1.778 m)   Wt 223 lb (101.2 kg)   BMI 32.00 kg/m         Assessment & Plan:  1. Annual physical exam -The patient is up-to-date on the majority of his health maintenance issues. -He will get his flu shot today. -He will check with his insurance regarding the new shingles shot. -He will need his next colonoscopy in 2020 -He will need a reevaluation by the cardiologist in 2020 with a possible repeat stress test. - BMP8+EGFR - CBC with Differential/Platelet - Lipid panel - VITAMIN D 25 Hydroxy (Vit-D Deficiency, Fractures) - Hepatic function panel - PSA, total and free - Thyroid Panel With TSH - Urinalysis, Complete  2. Pure hypercholesterolemia -Continue with aggressive therapeutic lifestyle changes and current cholesterol treatment pending results of lab work - CBC with Differential/Platelet - Lipid panel  3. Essential hypertension -The blood pressure is good and he will continue with current treatment - BMP8+EGFR - CBC with Differential/Platelet - Hepatic function panel  4. Vitamin D deficiency -Continue  with vitamin D replacement pending results of lab work - CBC with Differential/Platelet - VITAMIN D 25 Hydroxy (Vit-D Deficiency, Fractures)  5. Encounter for hepatitis C screening test for low risk patient - Hepatitis C antibody  6. Yeast dermatitis of penis -The patient will buy some Monistat vaginal cream and apply this sparingly a couple times a day to the area around the glans penis to help get rid of the rash that is present there and we'll uses for 7-10 days and call us back if there's no improvement  7. Metabolic syndrome -Continue to work on weight through diet and exercise  8. Family history of colon cancer -Annual FOBT's and repeat colonoscopy in the October time of 2020  9. Increased BMI -Continue to work on weight through diet and exercise  10. Benign prostatic hyperplasia without lower urinary tract symptoms -Continue to drink plenty  of fluids and stay well hydrated  Meds ordered this encounter  Medications  . fluticasone (FLONASE) 50 MCG/ACT nasal spray    Sig: Place 2 sprays into both nostrils daily.    Dispense:  16 g    Refill:  11   Patient Instructions                       Medicare Annual Wellness Visit  [facility] and the medical providers at [facility] strive to bring you the best medical care.  In doing so we not only want to address your current medical conditions and concerns but also to detect new conditions early and prevent illness, disease and health-related problems.    Medicare offers a yearly Wellness Visit which allows our clinical staff to assess your need for preventative services including immunizations, lifestyle education, counseling to decrease risk of preventable diseases and screening for fall risk and other medical concerns.    This visit is provided free of charge (no copay) for all Medicare recipients. The clinical pharmacists at [facility] have begun to conduct these Wellness Visits which will also include a thorough review of all your medications.    As you primary medical provider recommend that you make an appointment for your Annual Wellness Visit if you have not done so already this year.  You may set up this appointment before you leave today or you may call back ([phone]) and schedule an appointment.  Please make sure when you call that you mention that you are scheduling your Annual Wellness Visit with the clinical pharmacist so that the appointment may be made for the proper length of time.     Continue current medications. Continue good therapeutic lifestyle changes which include good diet and exercise. Fall precautions discussed with patient. If an FOBT was given today- please return it to our front desk. If you are over 68 years old - you may need Prevnar 45 or the adult Pneumonia vaccine.  **Flu shots are available--- please  call and schedule a FLU-CLINIC appointment**  After your visit with [place] today you will receive a survey in the mail or online from [employer] regarding your care with [place]. Please take a moment to fill this out. Your feedback is very important to [place] as you can help [place] better understand your patient needs as well as improve your experience and satisfaction. WE CARE ABOUT YOU!!!   The patient will be due his next colonoscopy in October 2020 He should get another visit to the cardiologist about that time as it will be  5 years since the last visit to the cardiologist With the lab work today we will also do a hepatitis C test to make sure that he is hepatitis C negative. He should check with his insurance regarding the new shingles shot or shingrix This should cover all of the health maintenance issues that are necessary for him moving forward. He will get his flu shot today. He had his eye exam this month.    Arrie Senate MD

## 2017-01-07 ENCOUNTER — Telehealth: Payer: Self-pay | Admitting: Family Medicine

## 2017-01-07 LAB — CBC WITH DIFFERENTIAL/PLATELET
BASOS: 0 %
Basophils Absolute: 0 10*3/uL (ref 0.0–0.2)
EOS (ABSOLUTE): 0.1 10*3/uL (ref 0.0–0.4)
EOS: 2 %
HEMATOCRIT: 41.1 % (ref 37.5–51.0)
Hemoglobin: 14.1 g/dL (ref 13.0–17.7)
IMMATURE GRANS (ABS): 0 10*3/uL (ref 0.0–0.1)
IMMATURE GRANULOCYTES: 0 %
LYMPHS: 36 %
Lymphocytes Absolute: 1.8 10*3/uL (ref 0.7–3.1)
MCH: 31.7 pg (ref 26.6–33.0)
MCHC: 34.3 g/dL (ref 31.5–35.7)
MCV: 92 fL (ref 79–97)
MONOCYTES: 9 %
Monocytes Absolute: 0.5 10*3/uL (ref 0.1–0.9)
NEUTROS PCT: 53 %
Neutrophils Absolute: 2.6 10*3/uL (ref 1.4–7.0)
PLATELETS: 288 10*3/uL (ref 150–379)
RBC: 4.45 x10E6/uL (ref 4.14–5.80)
RDW: 13.5 % (ref 12.3–15.4)
WBC: 5 10*3/uL (ref 3.4–10.8)

## 2017-01-07 LAB — THYROID PANEL WITH TSH
Free Thyroxine Index: 3.2 (ref 1.2–4.9)
T3 UPTAKE RATIO: 33 % (ref 24–39)
T4 TOTAL: 9.6 ug/dL (ref 4.5–12.0)
TSH: 1.02 u[IU]/mL (ref 0.450–4.500)

## 2017-01-07 LAB — LIPID PANEL
Chol/HDL Ratio: 2.4 ratio (ref 0.0–5.0)
Cholesterol, Total: 140 mg/dL (ref 100–199)
HDL: 58 mg/dL (ref >39)
LDL CALC: 69 mg/dL (ref 0–99)
Triglycerides: 64 mg/dL (ref 0–149)
VLDL CHOLESTEROL CAL: 13 mg/dL (ref 5–40)

## 2017-01-07 LAB — BMP8+EGFR
BUN/Creatinine Ratio: 16 (ref 10–24)
BUN: 19 mg/dL (ref 8–27)
CALCIUM: 9.1 mg/dL (ref 8.6–10.2)
CO2: 24 mmol/L (ref 20–29)
CREATININE: 1.19 mg/dL (ref 0.76–1.27)
Chloride: 101 mmol/L (ref 96–106)
GFR calc Af Amer: 72 mL/min/{1.73_m2} (ref >59)
GFR, EST NON AFRICAN AMERICAN: 62 mL/min/{1.73_m2} (ref >59)
Glucose: 91 mg/dL (ref 65–99)
Potassium: 4.5 mmol/L (ref 3.5–5.2)
Sodium: 140 mmol/L (ref 134–144)

## 2017-01-07 LAB — VITAMIN D 25 HYDROXY (VIT D DEFICIENCY, FRACTURES): VIT D 25 HYDROXY: 62.9 ng/mL (ref 30.0–100.0)

## 2017-01-07 LAB — HEPATIC FUNCTION PANEL
ALBUMIN: 4 g/dL (ref 3.6–4.8)
ALK PHOS: 62 IU/L (ref 39–117)
ALT: 23 IU/L (ref 0–44)
AST: 25 IU/L (ref 0–40)
Bilirubin Total: 0.6 mg/dL (ref 0.0–1.2)
Bilirubin, Direct: 0.18 mg/dL (ref 0.00–0.40)
TOTAL PROTEIN: 6.8 g/dL (ref 6.0–8.5)

## 2017-01-07 LAB — PSA, TOTAL AND FREE
PROSTATE SPECIFIC AG, SERUM: 9.6 ng/mL — AB (ref 0.0–4.0)
PSA FREE: 1.76 ng/mL
PSA, Free Pct: 18.3 %

## 2017-01-07 LAB — HEPATITIS C ANTIBODY

## 2017-01-07 MED ORDER — SULFAMETHOXAZOLE-TRIMETHOPRIM 800-160 MG PO TABS: 1.0000 | ORAL_TABLET | Freq: Two times a day (BID) | ORAL | 0 refills | Status: DC

## 2017-01-07 NOTE — Addendum Note (Signed)
Addended by: Zannie Cove on: 01/07/2017 02:39 PM   Modules accepted: Orders

## 2017-02-03 ENCOUNTER — Encounter (INDEPENDENT_AMBULATORY_CARE_PROVIDER_SITE_OTHER): Payer: Medicare HMO | Admitting: Family Medicine

## 2017-02-03 DIAGNOSIS — R300 Dysuria: Secondary | ICD-10-CM

## 2017-02-03 DIAGNOSIS — R972 Elevated prostate specific antigen [PSA]: Secondary | ICD-10-CM

## 2017-02-03 LAB — URINALYSIS, COMPLETE
Bilirubin, UA: NEGATIVE
Glucose, UA: NEGATIVE
Ketones, UA: NEGATIVE
NITRITE UA: NEGATIVE
Protein, UA: NEGATIVE
RBC, UA: NEGATIVE
Specific Gravity, UA: 1.02 (ref 1.005–1.030)
Urobilinogen, Ur: 0.2 mg/dL (ref 0.2–1.0)
pH, UA: 6 (ref 5.0–7.5)

## 2017-02-03 LAB — MICROSCOPIC EXAMINATION
EPITHELIAL CELLS (NON RENAL): NONE SEEN /HPF (ref 0–10)
RENAL EPITHEL UA: NONE SEEN /HPF

## 2017-02-04 ENCOUNTER — Other Ambulatory Visit: Payer: Self-pay | Admitting: *Deleted

## 2017-02-04 DIAGNOSIS — R972 Elevated prostate specific antigen [PSA]: Secondary | ICD-10-CM

## 2017-02-04 LAB — PSA, TOTAL AND FREE
PSA, Free Pct: 19.5 %
PSA, Free: 2.16 ng/mL
Prostate Specific Ag, Serum: 11.1 ng/mL — ABNORMAL HIGH (ref 0.0–4.0)

## 2017-02-04 LAB — URINE CULTURE

## 2017-02-04 NOTE — Progress Notes (Signed)
Pt here for labs and urine

## 2017-02-23 ENCOUNTER — Other Ambulatory Visit: Payer: Self-pay | Admitting: Family

## 2017-03-24 ENCOUNTER — Other Ambulatory Visit: Payer: Self-pay | Admitting: Family Medicine

## 2017-04-24 ENCOUNTER — Ambulatory Visit: Payer: Medicare HMO | Admitting: Urology

## 2017-04-24 ENCOUNTER — Other Ambulatory Visit: Payer: Medicare HMO

## 2017-04-24 DIAGNOSIS — N403 Nodular prostate with lower urinary tract symptoms: Secondary | ICD-10-CM | POA: Diagnosis not present

## 2017-04-24 DIAGNOSIS — R972 Elevated prostate specific antigen [PSA]: Secondary | ICD-10-CM

## 2017-04-24 DIAGNOSIS — R351 Nocturia: Secondary | ICD-10-CM

## 2017-04-25 LAB — PSA, TOTAL AND FREE
PSA, Free Pct: 23.7 %
PSA, Free: 0.64 ng/mL
Prostate Specific Ag, Serum: 2.7 ng/mL (ref 0.0–4.0)

## 2017-07-16 ENCOUNTER — Ambulatory Visit: Payer: Medicare HMO | Admitting: Family Medicine

## 2017-07-17 ENCOUNTER — Other Ambulatory Visit: Payer: Medicare HMO

## 2017-07-17 ENCOUNTER — Ambulatory Visit (INDEPENDENT_AMBULATORY_CARE_PROVIDER_SITE_OTHER): Payer: Medicare HMO

## 2017-07-17 ENCOUNTER — Encounter: Payer: Self-pay | Admitting: Family Medicine

## 2017-07-17 ENCOUNTER — Ambulatory Visit: Payer: Medicare HMO | Admitting: Family Medicine

## 2017-07-17 VITALS — BP 125/79 | HR 79 | Temp 97.2°F | Ht 70.0 in | Wt 225.0 lb

## 2017-07-17 DIAGNOSIS — E7800 Pure hypercholesterolemia, unspecified: Secondary | ICD-10-CM | POA: Diagnosis not present

## 2017-07-17 DIAGNOSIS — I10 Essential (primary) hypertension: Secondary | ICD-10-CM

## 2017-07-17 DIAGNOSIS — M2560 Stiffness of unspecified joint, not elsewhere classified: Secondary | ICD-10-CM

## 2017-07-17 DIAGNOSIS — R972 Elevated prostate specific antigen [PSA]: Secondary | ICD-10-CM | POA: Diagnosis not present

## 2017-07-17 DIAGNOSIS — J301 Allergic rhinitis due to pollen: Secondary | ICD-10-CM | POA: Diagnosis not present

## 2017-07-17 DIAGNOSIS — Z800 Family history of malignant neoplasm of digestive organs: Secondary | ICD-10-CM | POA: Diagnosis not present

## 2017-07-17 DIAGNOSIS — E559 Vitamin D deficiency, unspecified: Secondary | ICD-10-CM | POA: Diagnosis not present

## 2017-07-17 DIAGNOSIS — M545 Low back pain, unspecified: Secondary | ICD-10-CM

## 2017-07-17 DIAGNOSIS — M2569 Stiffness of other specified joint, not elsewhere classified: Secondary | ICD-10-CM

## 2017-07-17 LAB — MICROSCOPIC EXAMINATION
BACTERIA UA: NONE SEEN
Epithelial Cells (non renal): NONE SEEN /hpf (ref 0–10)
Renal Epithel, UA: NONE SEEN /hpf
WBC UA: NONE SEEN /HPF (ref 0–5)

## 2017-07-17 LAB — URINALYSIS, COMPLETE
BILIRUBIN UA: NEGATIVE
GLUCOSE, UA: NEGATIVE
Ketones, UA: NEGATIVE
Leukocytes, UA: NEGATIVE
NITRITE UA: NEGATIVE
Protein, UA: NEGATIVE
Specific Gravity, UA: 1.02 (ref 1.005–1.030)
UUROB: 0.2 mg/dL (ref 0.2–1.0)
pH, UA: 7 (ref 5.0–7.5)

## 2017-07-17 MED ORDER — FLUTICASONE PROPIONATE 50 MCG/ACT NA SUSP: 2.0000 | Freq: Every day | NASAL | 6 refills | Status: DC

## 2017-07-17 NOTE — Progress Notes (Signed)
Subjective:    Patient ID: Shawn Ochoa, male    DOB: 1950-07-26, 67 y.o.   MRN: [id number]  HPI Pt here for follow up and management of chronic medical problems which includes hyperlipidemia and hypertension. He is taking medication regularly.  The patient is doing well overall.  He has had problems with his elevated PSA and does have a return appointment scheduled with his urologist next week.  He has had lab work done today.  He will be given an FOBT to return.  His last colonoscopy was in October 2015 and he gets a colonoscopy every 5 years.  His most recent PSA was 11.1 and 6 months ago it was 9.6.  One year ago it was 2.1.  We will make sure that the PSA that was done on the lab work today, that his urologist gets a copy of this result.  Patient seems to have some cough and congestion almost year-round.  He is not been using the Flonase regularly.  He denies any chest pain or shortness of breath.  He denies any trouble with nausea vomiting diarrhea blood in the stool or black tarry bowel movements.  He is passing his water well but frequently and this is usually worse at nighttime.  He does not drink a lot of water during the day.  Actually on looking in the computer further his most recent PSA was 2.7 on January 11 of this year and that is much improved from the previous elevation in October.  The patient says many years ago that he did something he should not done and was pushing something very heavy and ever since that time has had periodic problems with his back.  He has a catch every now and then.    Patient Active Problem List   Diagnosis Date Noted  . Family history of colon cancer 11/28/2015  . Hypertension 12/06/2012  . Hyperlipidemia 12/06/2012  . Hypothyroidism 12/06/2012  . Metabolic syndrome 84/69/6295  . Knee cartilage, torn, right    Outpatient Encounter Medications as of 07/17/2017  Medication Sig  . aspirin 81 MG EC tablet Take 81 mg by mouth daily.    Marland Kitchen  atorvastatin (LIPITOR) 20 MG tablet TAKE 1 TABLET BY MOUTH EVERY DAY  . benazepril (LOTENSIN) 20 MG tablet TAKE 1 TABLET (20 MG TOTAL) BY MOUTH DAILY.  . Calcium Carbonate-Vitamin D (CALCIUM + D PO) Take 1 capsule by mouth daily.    . Cholecalciferol (VITAMIN D3) 5000 UNITS CAPS Take 1 capsule by mouth daily.    . fluticasone (FLONASE) 50 MCG/ACT nasal spray Place 2 sprays into both nostrils daily.  Marland Kitchen levothyroxine (SYNTHROID, LEVOTHROID) 75 MCG tablet TAKE 1 TABLET (75 MCG TOTAL) BY MOUTH DAILY.  Marland Kitchen Omega-3 Fatty Acids (FISH OIL) 1000 MG CAPS Take 1 capsule by mouth 2 (two) times daily.    . pioglitazone (ACTOS) 30 MG tablet TAKE 1 TABLET (30 MG TOTAL) BY MOUTH DAILY.  . [DISCONTINUED] levothyroxine (SYNTHROID, LEVOTHROID) 75 MCG tablet TAKE 1 TABLET BY MOUTH EVERY DAY  . [DISCONTINUED] sulfamethoxazole-trimethoprim (BACTRIM DS) 800-160 MG tablet Take 1 tablet by mouth 2 (two) times daily.   No facility-administered encounter medications on file as of 07/17/2017.      Review of Systems  Constitutional: Negative.   HENT: Negative.   Eyes: Negative.   Respiratory: Negative.   Cardiovascular: Negative.   Gastrointestinal: Negative.   Endocrine: Negative.   Genitourinary: Negative.   Musculoskeletal: Positive for back pain (stiffness).  Skin: Negative.  Allergic/Immunologic: Negative.   Neurological: Negative.   Hematological: Negative.   Psychiatric/Behavioral: Negative.        Objective:   Physical Exam  Constitutional: He is oriented to person, place, and time. He appears well-developed and well-nourished. No distress.  The patient is pleasant and alert   HENT:  Head: Normocephalic and atraumatic.  Right Ear: External ear normal.  Left Ear: External ear normal.  Mouth/Throat: Oropharynx is clear and moist. No oropharyngeal exudate.  Nasal turbinate congestion bilaterally  Eyes: Pupils are equal, round, and reactive to light. Conjunctivae and EOM are normal. Right eye exhibits  no discharge. Left eye exhibits no discharge. No scleral icterus.  Neck: Normal range of motion. Neck supple. No thyromegaly present.  No bruits thyromegaly or anterior cervical adenopathy  Cardiovascular: Normal rate, regular rhythm, normal heart sounds and intact distal pulses.  No murmur heard. The heart is regular at 72/min  Pulmonary/Chest: Effort normal and breath sounds normal. No respiratory distress. He has no wheezes. He has no rales. He exhibits no tenderness.  Clear anteriorly and posteriorly and no axillary adenopathy or chest wall masses  Abdominal: Soft. Bowel sounds are normal. He exhibits no mass. There is no tenderness. There is no rebound and no guarding.  Genitourinary:  Genitourinary Comments: She has follow-up appointment with urologist next Friday because of periodic elevations in the PSA which has recently improved.  Musculoskeletal: Normal range of motion. He exhibits no edema, tenderness or deformity.  Lymphadenopathy:    He has no cervical adenopathy.  Neurological: He is alert and oriented to person, place, and time.  Skin: Skin is warm and dry. No rash noted.  Psychiatric: He has a normal mood and affect. His behavior is normal. Judgment and thought content normal.  Nursing note and vitals reviewed.  BP 125/79 (BP Location: Left Arm)   Pulse 79   Temp (!) 97.2 F (36.2 C) (Oral)   Ht 5\' 10"  (1.778 m)   Wt 225 lb (102.1 kg)   BMI 32.28 kg/m         Assessment & Plan:  1. Pure hypercholesterolemia -Continue current treatment pending results of lab work - [employer]; Future  2. Elevated PSA -The most recent PSA was back down to 2.7 and this was in early January.  The patient has follow-up visit with urologist next week and another PSA is pending that visit.  3. Essential hypertension -Blood pressure is good today and patient will continue with current treatment - DG Chest 2 View; Future  4. Vitamin D deficiency -Continue with current  treatment pending results of lab work  5. Low back pain, unspecified back pain laterality, unspecified chronicity, with sciatica presence unspecified - DG Lumbar Spine 2-3 Views; Future  6. Back stiffness - DG Lumbar Spine 2-3 Views; Future  7. Non-seasonal allergic rhinitis due to pollen -Start with Flonase 1 spray each nostril and continue this indefinitely  8. Family history of colon cancer -Repeat colonoscopy will be due next fall.  That will be in the year 2020.  9. Midline low back pain without sciatica, unspecified chronicity -LS spine films  Meds ordered this encounter  Medications  . fluticasone (FLONASE) 50 MCG/ACT nasal spray    Sig: Place 2 sprays into both nostrils daily.    Dispense:  16 g    Refill:  6   Patient Instructions  Medicare Annual Wellness Visit  [facility] and the medical providers at [facility] strive to bring you the best medical care.  In doing so we not only want to address your current medical conditions and concerns but also to detect new conditions early and prevent illness, disease and health-related problems.    Medicare offers a yearly Wellness Visit which allows our clinical staff to assess your need for preventative services including immunizations, lifestyle education, counseling to decrease risk of preventable diseases and screening for fall risk and other medical concerns.    This visit is provided free of charge (no copay) for all Medicare recipients. The clinical pharmacists at [facility] have begun to conduct these Wellness Visits which will also include a thorough review of all your medications.    As you primary medical provider recommend that you make an appointment for your Annual Wellness Visit if you have not done so already this year.  You may set up this appointment before you leave today or you may call back ([phone]) and schedule an appointment.  Please make  sure when you call that you mention that you are scheduling your Annual Wellness Visit with the clinical pharmacist so that the appointment may be made for the proper length of time.     Continue current medications. Continue good therapeutic lifestyle changes which include good diet and exercise. Fall precautions discussed with patient. If an FOBT was given today- please return it to our front desk. If you are over 25 years old - you may need Prevnar 24 or the adult Pneumonia vaccine.  **Flu shots are available--- please call and schedule a FLU-CLINIC appointment**  After your visit with [place] today you will receive a survey in the mail or online from [employer] regarding your care with [place]. Please take a moment to fill this out. Your feedback is very important to [place] as you can help [place] better understand your patient needs as well as improve your experience and satisfaction. WE CARE ABOUT YOU!!!   When your lab work is returned, we will make sure that Dr. Jeffie Pollock gets a copy of this and in fact you should take a hard copy of this when you go to that visit next week Continue to drink plenty of fluids and stay well-hydrated We will call with your x-ray results of the lumbar spine and the chest as soon as those results become available Continue to walk regularly and avoid any heavy lifting pushing or pulling if possible Use your Flonase regularly for allergic rhinitis and supplement this with Claritin or Allegra depending on the season of the year when things get worse and the Flonase does not seem to be working as well. Continue with nasal saline Drink more water and watch diet more closely in order to lose weight as the extra weight puts more stress on the spine.  Arrie Senate MD  .

## 2017-07-17 NOTE — Patient Instructions (Addendum)
Medicare Annual Wellness Visit  [facility] and the medical providers at [facility] strive to bring you the best medical care.  In doing so we not only want to address your current medical conditions and concerns but also to detect new conditions early and prevent illness, disease and health-related problems.    Medicare offers a yearly Wellness Visit which allows our clinical staff to assess your need for preventative services including immunizations, lifestyle education, counseling to decrease risk of preventable diseases and screening for fall risk and other medical concerns.    This visit is provided free of charge (no copay) for all Medicare recipients. The clinical pharmacists at [facility] have begun to conduct these Wellness Visits which will also include a thorough review of all your medications.    As you primary medical provider recommend that you make an appointment for your Annual Wellness Visit if you have not done so already this year.  You may set up this appointment before you leave today or you may call back ([phone]) and schedule an appointment.  Please make sure when you call that you mention that you are scheduling your Annual Wellness Visit with the clinical pharmacist so that the appointment may be made for the proper length of time.     Continue current medications. Continue good therapeutic lifestyle changes which include good diet and exercise. Fall precautions discussed with patient. If an FOBT was given today- please return it to our front desk. If you are over 58 years old - you may need Prevnar 39 or the adult Pneumonia vaccine.  **Flu shots are available--- please call and schedule a FLU-CLINIC appointment**  After your visit with [place] today you will receive a survey in the mail or online from [employer] regarding your care with [place]. Please take a moment to fill this out. Your feedback is very  important to [place] as you can help [place] better understand your patient needs as well as improve your experience and satisfaction. WE CARE ABOUT YOU!!!   When your lab work is returned, we will make sure that Dr. Jeffie Pollock gets a copy of this and in fact you should take a hard copy of this when you go to that visit next week Continue to drink plenty of fluids and stay well-hydrated We will call with your x-ray results of the lumbar spine and the chest as soon as those results become available Continue to walk regularly and avoid any heavy lifting pushing or pulling if possible Use your Flonase regularly for allergic rhinitis and supplement this with Claritin or Allegra depending on the season of the year when things get worse and the Flonase does not seem to be working as well. Continue with nasal saline Drink more water and watch diet more closely in order to lose weight as the extra weight puts more stress on the spine.

## 2017-07-18 LAB — CBC WITH DIFFERENTIAL/PLATELET
Basophils Absolute: 0 10*3/uL (ref 0.0–0.2)
Basos: 1 %
EOS (ABSOLUTE): 0.1 10*3/uL (ref 0.0–0.4)
Eos: 3 %
Hematocrit: 41.7 % (ref 37.5–51.0)
Hemoglobin: 13.8 g/dL (ref 13.0–17.7)
IMMATURE GRANULOCYTES: 1 %
Immature Grans (Abs): 0 10*3/uL (ref 0.0–0.1)
Lymphocytes Absolute: 1.4 10*3/uL (ref 0.7–3.1)
Lymphs: 33 %
MCH: 30.9 pg (ref 26.6–33.0)
MCHC: 33.1 g/dL (ref 31.5–35.7)
MCV: 94 fL (ref 79–97)
MONOS ABS: 0.4 10*3/uL (ref 0.1–0.9)
Monocytes: 9 %
NEUTROS PCT: 53 %
Neutrophils Absolute: 2.5 10*3/uL (ref 1.4–7.0)
PLATELETS: 272 10*3/uL (ref 150–379)
RBC: 4.46 x10E6/uL (ref 4.14–5.80)
RDW: 13.8 % (ref 12.3–15.4)
WBC: 4.4 10*3/uL (ref 3.4–10.8)

## 2017-07-18 LAB — HEPATIC FUNCTION PANEL
ALBUMIN: 3.6 g/dL (ref 3.6–4.8)
ALK PHOS: 55 IU/L (ref 39–117)
ALT: 25 IU/L (ref 0–44)
AST: 21 IU/L (ref 0–40)
BILIRUBIN TOTAL: 0.7 mg/dL (ref 0.0–1.2)
Bilirubin, Direct: 0.18 mg/dL (ref 0.00–0.40)
TOTAL PROTEIN: 6.1 g/dL (ref 6.0–8.5)

## 2017-07-18 LAB — PSA, TOTAL AND FREE
PSA FREE PCT: 25.8 %
PSA, Free: 0.67 ng/mL
Prostate Specific Ag, Serum: 2.6 ng/mL (ref 0.0–4.0)

## 2017-07-18 LAB — LIPID PANEL
Chol/HDL Ratio: 2.7 ratio (ref 0.0–5.0)
Cholesterol, Total: 152 mg/dL (ref 100–199)
HDL: 56 mg/dL (ref >39)
LDL Calculated: 81 mg/dL (ref 0–99)
TRIGLYCERIDES: 77 mg/dL (ref 0–149)
VLDL Cholesterol Cal: 15 mg/dL (ref 5–40)

## 2017-07-18 LAB — BMP8+EGFR
BUN/Creatinine Ratio: 17 (ref 10–24)
BUN: 20 mg/dL (ref 8–27)
CHLORIDE: 104 mmol/L (ref 96–106)
CO2: 24 mmol/L (ref 20–29)
Calcium: 9.4 mg/dL (ref 8.6–10.2)
Creatinine, Ser: 1.18 mg/dL (ref 0.76–1.27)
GFR calc Af Amer: 73 mL/min/{1.73_m2} (ref >59)
GFR calc non Af Amer: 63 mL/min/{1.73_m2} (ref >59)
GLUCOSE: 94 mg/dL (ref 65–99)
POTASSIUM: 4.6 mmol/L (ref 3.5–5.2)
SODIUM: 144 mmol/L (ref 134–144)

## 2017-07-18 LAB — VITAMIN D 25 HYDROXY (VIT D DEFICIENCY, FRACTURES): Vit D, 25-Hydroxy: 62 ng/mL (ref 30.0–100.0)

## 2017-07-20 ENCOUNTER — Other Ambulatory Visit: Payer: Self-pay | Admitting: *Deleted

## 2017-07-20 MED ORDER — MELOXICAM 7.5 MG PO TABS: 7.5000 mg | ORAL_TABLET | Freq: Every day | ORAL | 0 refills | Status: DC

## 2017-07-22 ENCOUNTER — Telehealth: Payer: Self-pay | Admitting: Family Medicine

## 2017-07-22 NOTE — Telephone Encounter (Signed)
Discontinue the Mobic and remember this in the future if it is ever prescribed again and in the short haul just take extra strength Tylenol if needed for pain

## 2017-07-22 NOTE — Telephone Encounter (Signed)
Patient reports on his second day of taking the Mobic he had bright red blood in his stool.  He has discontinued the Mobic but is unsure if there is anything else you want him to do.

## 2017-07-22 NOTE — Telephone Encounter (Signed)
Pt aware of detailed message

## 2017-07-24 ENCOUNTER — Ambulatory Visit: Payer: Medicare HMO | Admitting: Urology

## 2017-07-24 DIAGNOSIS — R972 Elevated prostate specific antigen [PSA]: Secondary | ICD-10-CM | POA: Diagnosis not present

## 2017-07-24 DIAGNOSIS — R351 Nocturia: Secondary | ICD-10-CM | POA: Diagnosis not present

## 2017-07-24 DIAGNOSIS — N401 Enlarged prostate with lower urinary tract symptoms: Secondary | ICD-10-CM

## 2017-08-20 ENCOUNTER — Other Ambulatory Visit: Payer: Self-pay | Admitting: Family Medicine

## 2017-08-30 ENCOUNTER — Other Ambulatory Visit: Payer: Self-pay | Admitting: Family Medicine

## 2017-09-14 ENCOUNTER — Other Ambulatory Visit: Payer: Self-pay | Admitting: Family Medicine

## 2017-09-17 DIAGNOSIS — L819 Disorder of pigmentation, unspecified: Secondary | ICD-10-CM | POA: Diagnosis not present

## 2017-09-17 DIAGNOSIS — L814 Other melanin hyperpigmentation: Secondary | ICD-10-CM | POA: Diagnosis not present

## 2017-09-17 DIAGNOSIS — D225 Melanocytic nevi of trunk: Secondary | ICD-10-CM | POA: Diagnosis not present

## 2017-09-17 DIAGNOSIS — D1801 Hemangioma of skin and subcutaneous tissue: Secondary | ICD-10-CM | POA: Diagnosis not present

## 2017-09-17 DIAGNOSIS — L570 Actinic keratosis: Secondary | ICD-10-CM | POA: Diagnosis not present

## 2017-11-22 ENCOUNTER — Other Ambulatory Visit: Payer: Self-pay | Admitting: Family Medicine

## 2018-01-27 ENCOUNTER — Encounter: Payer: Self-pay | Admitting: Family Medicine

## 2018-01-27 ENCOUNTER — Ambulatory Visit: Payer: Medicare HMO | Admitting: Family Medicine

## 2018-01-27 ENCOUNTER — Ambulatory Visit (INDEPENDENT_AMBULATORY_CARE_PROVIDER_SITE_OTHER): Payer: Medicare HMO

## 2018-01-27 VITALS — BP 112/67 | HR 70 | Temp 96.8°F | Ht 70.0 in | Wt 224.0 lb

## 2018-01-27 DIAGNOSIS — E559 Vitamin D deficiency, unspecified: Secondary | ICD-10-CM | POA: Diagnosis not present

## 2018-01-27 DIAGNOSIS — S91332A Puncture wound without foreign body, left foot, initial encounter: Secondary | ICD-10-CM | POA: Diagnosis not present

## 2018-01-27 DIAGNOSIS — M25552 Pain in left hip: Secondary | ICD-10-CM

## 2018-01-27 DIAGNOSIS — E7800 Pure hypercholesterolemia, unspecified: Secondary | ICD-10-CM

## 2018-01-27 DIAGNOSIS — Z23 Encounter for immunization: Secondary | ICD-10-CM

## 2018-01-27 DIAGNOSIS — R972 Elevated prostate specific antigen [PSA]: Secondary | ICD-10-CM | POA: Diagnosis not present

## 2018-01-27 DIAGNOSIS — I10 Essential (primary) hypertension: Secondary | ICD-10-CM | POA: Diagnosis not present

## 2018-01-27 DIAGNOSIS — M1612 Unilateral primary osteoarthritis, left hip: Secondary | ICD-10-CM | POA: Diagnosis not present

## 2018-01-27 MED ORDER — CEPHALEXIN 500 MG PO CAPS: 500.0000 mg | ORAL_CAPSULE | Freq: Three times a day (TID) | ORAL | 0 refills | Status: DC

## 2018-01-27 NOTE — Progress Notes (Signed)
Subjective:    Patient ID: Shawn Ochoa, male    DOB: 1951/03/13, 67 y.o.   MRN: [id number]  HPI Pt here for follow up and management of chronic medical problems which includes hypertension and hyperlipidemia. He is taking medication regularly.  The patient today is complaining with some stiffness in his back and nail in his foot and will get a tetanus shot.  He also wants a refill on Flonase.  He saw Dr. Fredrich Birks and April of this year.  He will be given an FOBT to return and will get lab work today.  The patient will also get a flu shot today.  The patient is pleasant and alert.  He says he is having a lot more arthralgias.  Especially the left hip and pain with activity and walking and mowing.  He also has some discomfort in his left shoulder.  He has had x-rays in the past and we will review those to make a decision about whether we need to do additional x-rays.  Less than a week ago he had been mowing the yard and felt something in his foot and when he took his shoes off found a tiny tacked that it punctured the skin on his foot.  We will look at this today also.  He will also get a tetanus shot today.  The patient denies any chest pain pressure or tightness other than occasional sharp pains in the chest.  He also has some shortness of breath especially when he is carrying something and going up he will but otherwise with just walking there is no more shortness of breath than usual.  He denies any trouble with swallowing but does have occasional heartburn that is only brief in nature and most really relieved by drinking some milk or giving this some time.  This is associated with eating.  He denies any trouble with swallowing nausea vomiting diarrhea blood in the stool or black tarry bowel movements and has not had a change in bowel habits.  He saw Dr. Fredrich Birks in April.  He discussed the nocturia situation with him and that did not get addressed.  He may have nocturia up to 3-4 times.  He has not  seen any blood in the urine.  The patient had LS spine films done in April of this year which showed no acute abnormality with vertebral body height being well maintained and there was no anterior listhesis noted.     Patient Active Problem List   Diagnosis Date Noted  . Family history of colon cancer 11/28/2015  . Hypertension 12/06/2012  . Hyperlipidemia 12/06/2012  . Hypothyroidism 12/06/2012  . Metabolic syndrome 14/23/9532  . Knee cartilage, torn, right    Outpatient Encounter Medications as of 01/27/2018  Medication Sig  . aspirin 81 MG EC tablet Take 81 mg by mouth daily.    Marland Kitchen atorvastatin (LIPITOR) 20 MG tablet TAKE 1 TABLET BY MOUTH EVERY DAY  . benazepril (LOTENSIN) 20 MG tablet TAKE 1 TABLET (20 MG TOTAL) BY MOUTH DAILY.  . Calcium Carbonate-Vitamin D (CALCIUM + D PO) Take 1 capsule by mouth daily.    . Cholecalciferol (VITAMIN D3) 5000 UNITS CAPS Take 1 capsule by mouth daily.    . fluticasone (FLONASE) 50 MCG/ACT nasal spray Place 2 sprays into both nostrils daily.  Marland Kitchen levothyroxine (SYNTHROID, LEVOTHROID) 75 MCG tablet TAKE 1 TABLET BY MOUTH EVERY DAY  . Omega-3 Fatty Acids (FISH OIL) 1000 MG CAPS Take 1 capsule by mouth 2 (two)  times daily.    . pioglitazone (ACTOS) 30 MG tablet TAKE 1 TABLET (30 MG TOTAL) BY MOUTH DAILY.   No facility-administered encounter medications on file as of 01/27/2018.      Review of Systems  Constitutional: Negative.   HENT: Negative.   Eyes: Negative.   Respiratory: Negative.   Cardiovascular: Negative.   Gastrointestinal: Negative.   Endocrine: Negative.   Genitourinary: Negative.   Musculoskeletal: Positive for back pain (stiffness).  Skin: Positive for wound (left foot - puncture wound - stepped  on a nail ).  Allergic/Immunologic: Negative.   Neurological: Negative.   Hematological: Negative.   Psychiatric/Behavioral: Negative.        Objective:   Physical Exam  Constitutional: He is oriented to person, place, and time.  He appears well-developed and well-nourished. No distress.  Patient is pleasant and alert and did see the urologist in April and said that he did not need to see him back any more.  HENT:  Head: Normocephalic and atraumatic.  Right Ear: External ear normal.  Left Ear: External ear normal.  Nose: Nose normal.  Mouth/Throat: Oropharynx is clear and moist. No oropharyngeal exudate.  Eyes: Pupils are equal, round, and reactive to light. Conjunctivae and EOM are normal. Right eye exhibits no discharge. Left eye exhibits no discharge. No scleral icterus.  Follow-up and get eye exam as planned  Neck: Normal range of motion. Neck supple. No thyromegaly present.  No bruits thyromegaly or anterior cervical adenopathy  Cardiovascular: Normal rate, regular rhythm, normal heart sounds and intact distal pulses.  No murmur heard. Heart is regular at 72/min with good pedal pulses and no edema  Pulmonary/Chest: Effort normal and breath sounds normal. No respiratory distress. He has no wheezes. He has no rales. He exhibits no tenderness.  Clear anteriorly and posteriorly and no axillary adenopathy no chest wall masses  Abdominal: Soft. Bowel sounds are normal. He exhibits no mass. There is no tenderness. There is no rebound and no guarding.  No liver or spleen enlargement.  No epigastric tenderness no masses no bruits and no inguinal adenopathy with good inguinal pulses  Genitourinary:  Genitourinary Comments: Examined by urologist in April  Musculoskeletal: Normal range of motion. He exhibits tenderness. He exhibits no edema or deformity.  Tender left lateral hip with pain with leg raising.  Tender to palpation.  Otherwise fairly good range of motion.  Lymphadenopathy:    He has no cervical adenopathy.  Neurological: He is alert and oriented to person, place, and time. He has normal reflexes. No cranial nerve deficit.  Reflexes are 2+ and equal bilaterally  Skin: Skin is warm and dry. No rash noted.    Small puncture wound left heel area with minimal redness and no drainage.  Psychiatric: He has a normal mood and affect. His behavior is normal. Judgment and thought content normal.  Mood affect and behavior are all normal for this patient  Nursing note and vitals reviewed.   BP 112/67 (BP Location: Left Arm)   Pulse 70   Temp (!) 96.8 F (36 C) (Oral)   Ht '5\' 10"'  (1.778 m)   Wt 224 lb (101.6 kg)   BMI 32.14 kg/m        Assessment & Plan:  1. Pure hypercholesterolemia -Continue current treatment pending results of lab work - CBC with Differential/Platelet - Lipid panel  2. Essential hypertension -Blood pressure is good today and he will continue with current treatment - BMP8+EGFR - CBC with Differential/Platelet - Hepatic function  panel  3. Elevated PSA -Patient saw urologist in April and was told at that time that no further need for recheck. - CBC with Differential/Platelet  4. Vitamin D deficiency -Continue with current treatment pending results of lab work - CBC with Differential/Platelet - VITAMIN D 25 Hydroxy (Vit-D Deficiency, Fractures)  5. Puncture wound of left foot, initial encounter -Take antibiotic as directed and continue to soak heel couple times daily with warm salty water and clean with alcohol and peroxide.  If any redness develops patient should get back in touch with [place]. - Td : Tetanus/diphtheria >7yo Preservative  free - CBC with Differential/Platelet  6. Left hip pain -X-ray left hip and do referral for possible injection to orthopedic surgeon that comes to this office.  Meds ordered this encounter  Medications  . cephALEXin (KEFLEX) 500 MG capsule    Sig: Take 1 capsule (500 mg total) by mouth 3 (three) times daily.    Dispense:  30 capsule    Refill:  0   Patient Instructions                       Medicare Annual Wellness Visit  [facility] and the medical providers at [facility] strive to bring you the best  medical care.  In doing so we not only want to address your current medical conditions and concerns but also to detect new conditions early and prevent illness, disease and health-related problems.    Medicare offers a yearly Wellness Visit which allows our clinical staff to assess your need for preventative services including immunizations, lifestyle education, counseling to decrease risk of preventable diseases and screening for fall risk and other medical concerns.    This visit is provided free of charge (no copay) for all Medicare recipients. The clinical pharmacists at [facility] have begun to conduct these Wellness Visits which will also include a thorough review of all your medications.    As you primary medical provider recommend that you make an appointment for your Annual Wellness Visit if you have not done so already this year.  You may set up this appointment before you leave today or you may call back ([phone]) and schedule an appointment.  Please make sure when you call that you mention that you are scheduling your Annual Wellness Visit with the clinical pharmacist so that the appointment may be made for the proper length of time.     Continue current medications. Continue good therapeutic lifestyle changes which include good diet and exercise. Fall precautions discussed with patient. If an FOBT was given today- please return it to our front desk. If you are over 60 years old - you may need Prevnar 4 or the adult Pneumonia vaccine.  **Flu shots are available--- please call and schedule a FLU-CLINIC appointment**  After your visit with [place] today you will receive a survey in the mail or online from [employer] regarding your care with [place]. Please take a moment to fill this out. Your feedback is very important to [place] as you can help [place] better understand your patient needs as well as improve your experience and satisfaction. WE CARE ABOUT YOU!!!   We will call  with lab work results as soon as those results become available Avoid irritating environments is much as possible This winter keep the house as cool as possible and use humidification Do not use any overhead fans Take antibiotic as directed Continue to cleanse the foot  with peroxide alcohol and keep a dressing on it during the day and let it be open to the air at night Soaking in some warm salty water may also be helpful a couple times daily. If there is any worsening redness or pain, the patient should get back in touch with [place] as soon as possible  Arrie Senate MD

## 2018-01-27 NOTE — Patient Instructions (Addendum)
Medicare Annual Wellness Visit  [facility] and the medical providers at [facility] strive to bring you the best medical care.  In doing so we not only want to address your current medical conditions and concerns but also to detect new conditions early and prevent illness, disease and health-related problems.    Medicare offers a yearly Wellness Visit which allows our clinical staff to assess your need for preventative services including immunizations, lifestyle education, counseling to decrease risk of preventable diseases and screening for fall risk and other medical concerns.    This visit is provided free of charge (no copay) for all Medicare recipients. The clinical pharmacists at [facility] have begun to conduct these Wellness Visits which will also include a thorough review of all your medications.    As you primary medical provider recommend that you make an appointment for your Annual Wellness Visit if you have not done so already this year.  You may set up this appointment before you leave today or you may call back ([phone]) and schedule an appointment.  Please make sure when you call that you mention that you are scheduling your Annual Wellness Visit with the clinical pharmacist so that the appointment may be made for the proper length of time.     Continue current medications. Continue good therapeutic lifestyle changes which include good diet and exercise. Fall precautions discussed with patient. If an FOBT was given today- please return it to our front desk. If you are over 60 years old - you may need Prevnar 28 or the adult Pneumonia vaccine.  **Flu shots are available--- please call and schedule a FLU-CLINIC appointment**  After your visit with [place] today you will receive a survey in the mail or online from [employer] regarding your care with [place]. Please take a moment to fill this out. Your feedback is very  important to [place] as you can help [place] better understand your patient needs as well as improve your experience and satisfaction. WE CARE ABOUT YOU!!!   We will call with lab work results as soon as those results become available Avoid irritating environments is much as possible This winter keep the house as cool as possible and use humidification Do not use any overhead fans Take antibiotic as directed Continue to cleanse the foot with peroxide alcohol and keep a dressing on it during the day and let it be open to the air at night Soaking in some warm salty water may also be helpful a couple times daily. If there is any worsening redness or pain, the patient should get back in touch with [place] as soon as possible

## 2018-01-28 ENCOUNTER — Other Ambulatory Visit: Payer: Self-pay | Admitting: *Deleted

## 2018-01-28 LAB — LIPID PANEL
CHOLESTEROL TOTAL: 147 mg/dL (ref 100–199)
Chol/HDL Ratio: 2.6 ratio (ref 0.0–5.0)
HDL: 56 mg/dL (ref >39)
LDL CALC: 79 mg/dL (ref 0–99)
Triglycerides: 61 mg/dL (ref 0–149)
VLDL Cholesterol Cal: 12 mg/dL (ref 5–40)

## 2018-01-28 LAB — CBC WITH DIFFERENTIAL/PLATELET
BASOS ABS: 0 10*3/uL (ref 0.0–0.2)
BASOS: 1 %
EOS (ABSOLUTE): 0.1 10*3/uL (ref 0.0–0.4)
Eos: 2 %
HEMOGLOBIN: 13.2 g/dL (ref 13.0–17.7)
Hematocrit: 40.8 % (ref 37.5–51.0)
Immature Grans (Abs): 0.1 10*3/uL (ref 0.0–0.1)
Immature Granulocytes: 1 %
LYMPHS ABS: 1.4 10*3/uL (ref 0.7–3.1)
Lymphs: 29 %
MCH: 30.5 pg (ref 26.6–33.0)
MCHC: 32.4 g/dL (ref 31.5–35.7)
MCV: 94 fL (ref 79–97)
MONOCYTES: 12 %
Monocytes Absolute: 0.6 10*3/uL (ref 0.1–0.9)
Neutrophils Absolute: 2.6 10*3/uL (ref 1.4–7.0)
Neutrophils: 55 %
Platelets: 290 10*3/uL (ref 150–450)
RBC: 4.33 x10E6/uL (ref 4.14–5.80)
RDW: 12.4 % (ref 12.3–15.4)
WBC: 4.7 10*3/uL (ref 3.4–10.8)

## 2018-01-28 LAB — BMP8+EGFR
BUN / CREAT RATIO: 17 (ref 10–24)
BUN: 22 mg/dL (ref 8–27)
CHLORIDE: 104 mmol/L (ref 96–106)
CO2: 24 mmol/L (ref 20–29)
Calcium: 9.3 mg/dL (ref 8.6–10.2)
Creatinine, Ser: 1.33 mg/dL — ABNORMAL HIGH (ref 0.76–1.27)
GFR calc non Af Amer: 54 mL/min/{1.73_m2} — ABNORMAL LOW (ref >59)
GFR, EST AFRICAN AMERICAN: 63 mL/min/{1.73_m2} (ref >59)
Glucose: 93 mg/dL (ref 65–99)
POTASSIUM: 4.2 mmol/L (ref 3.5–5.2)
Sodium: 140 mmol/L (ref 134–144)

## 2018-01-28 LAB — HEPATIC FUNCTION PANEL
ALBUMIN: 3.7 g/dL (ref 3.6–4.8)
ALK PHOS: 52 IU/L (ref 39–117)
ALT: 26 IU/L (ref 0–44)
AST: 25 IU/L (ref 0–40)
Bilirubin Total: 0.7 mg/dL (ref 0.0–1.2)
Bilirubin, Direct: 0.19 mg/dL (ref 0.00–0.40)
TOTAL PROTEIN: 6.2 g/dL (ref 6.0–8.5)

## 2018-01-28 LAB — VITAMIN D 25 HYDROXY (VIT D DEFICIENCY, FRACTURES): VIT D 25 HYDROXY: 57.9 ng/mL (ref 30.0–100.0)

## 2018-01-28 LAB — SEDIMENTATION RATE: SED RATE: 7 mm/h (ref 0–30)

## 2018-02-07 ENCOUNTER — Other Ambulatory Visit: Payer: Self-pay | Admitting: Family Medicine

## 2018-02-16 ENCOUNTER — Other Ambulatory Visit: Payer: Self-pay | Admitting: Family Medicine

## 2018-02-17 NOTE — Telephone Encounter (Signed)
Last thyroid 01/06/17

## 2018-03-04 DIAGNOSIS — M25552 Pain in left hip: Secondary | ICD-10-CM | POA: Diagnosis not present

## 2018-03-05 DIAGNOSIS — L821 Other seborrheic keratosis: Secondary | ICD-10-CM | POA: Diagnosis not present

## 2018-03-05 DIAGNOSIS — Z85828 Personal history of other malignant neoplasm of skin: Secondary | ICD-10-CM | POA: Diagnosis not present

## 2018-03-05 DIAGNOSIS — L570 Actinic keratosis: Secondary | ICD-10-CM | POA: Diagnosis not present

## 2018-03-05 DIAGNOSIS — L819 Disorder of pigmentation, unspecified: Secondary | ICD-10-CM | POA: Diagnosis not present

## 2018-03-05 DIAGNOSIS — D229 Melanocytic nevi, unspecified: Secondary | ICD-10-CM | POA: Diagnosis not present

## 2018-03-15 ENCOUNTER — Other Ambulatory Visit: Payer: Self-pay | Admitting: Family Medicine

## 2018-05-15 ENCOUNTER — Other Ambulatory Visit: Payer: Self-pay | Admitting: Family Medicine

## 2018-05-17 ENCOUNTER — Other Ambulatory Visit: Payer: Medicare HMO

## 2018-05-17 DIAGNOSIS — Z1211 Encounter for screening for malignant neoplasm of colon: Secondary | ICD-10-CM

## 2018-05-18 LAB — FECAL OCCULT BLOOD, IMMUNOCHEMICAL: Fecal Occult Bld: NEGATIVE

## 2018-05-19 ENCOUNTER — Other Ambulatory Visit: Payer: Self-pay | Admitting: Family Medicine

## 2018-06-06 ENCOUNTER — Other Ambulatory Visit: Payer: Self-pay | Admitting: Family Medicine

## 2018-07-30 ENCOUNTER — Ambulatory Visit: Payer: Medicare HMO | Admitting: Family Medicine

## 2018-08-12 ENCOUNTER — Other Ambulatory Visit: Payer: Self-pay | Admitting: Family Medicine

## 2018-08-20 ENCOUNTER — Other Ambulatory Visit: Payer: Self-pay | Admitting: Family Medicine

## 2018-08-20 DIAGNOSIS — I10 Essential (primary) hypertension: Secondary | ICD-10-CM | POA: Diagnosis not present

## 2018-08-20 DIAGNOSIS — E039 Hypothyroidism, unspecified: Secondary | ICD-10-CM | POA: Diagnosis not present

## 2018-08-20 DIAGNOSIS — J309 Allergic rhinitis, unspecified: Secondary | ICD-10-CM | POA: Diagnosis not present

## 2018-08-20 DIAGNOSIS — E785 Hyperlipidemia, unspecified: Secondary | ICD-10-CM | POA: Diagnosis not present

## 2018-08-20 DIAGNOSIS — Z823 Family history of stroke: Secondary | ICD-10-CM | POA: Diagnosis not present

## 2018-08-20 DIAGNOSIS — Z8249 Family history of ischemic heart disease and other diseases of the circulatory system: Secondary | ICD-10-CM | POA: Diagnosis not present

## 2018-08-20 DIAGNOSIS — Z7982 Long term (current) use of aspirin: Secondary | ICD-10-CM | POA: Diagnosis not present

## 2018-08-20 DIAGNOSIS — E8881 Metabolic syndrome: Secondary | ICD-10-CM | POA: Diagnosis not present

## 2018-08-20 DIAGNOSIS — Z7722 Contact with and (suspected) exposure to environmental tobacco smoke (acute) (chronic): Secondary | ICD-10-CM | POA: Diagnosis not present

## 2018-08-20 DIAGNOSIS — E669 Obesity, unspecified: Secondary | ICD-10-CM | POA: Diagnosis not present

## 2018-08-27 ENCOUNTER — Other Ambulatory Visit: Payer: Self-pay | Admitting: Family Medicine

## 2018-09-01 ENCOUNTER — Other Ambulatory Visit: Payer: Self-pay | Admitting: Family Medicine

## 2018-09-14 ENCOUNTER — Other Ambulatory Visit: Payer: Self-pay

## 2018-09-14 ENCOUNTER — Encounter: Payer: Self-pay | Admitting: Family Medicine

## 2018-09-14 ENCOUNTER — Ambulatory Visit (INDEPENDENT_AMBULATORY_CARE_PROVIDER_SITE_OTHER): Payer: Medicare HMO | Admitting: Family Medicine

## 2018-09-14 DIAGNOSIS — Z800 Family history of malignant neoplasm of digestive organs: Secondary | ICD-10-CM

## 2018-09-14 DIAGNOSIS — E559 Vitamin D deficiency, unspecified: Secondary | ICD-10-CM | POA: Diagnosis not present

## 2018-09-14 DIAGNOSIS — R972 Elevated prostate specific antigen [PSA]: Secondary | ICD-10-CM

## 2018-09-14 DIAGNOSIS — I10 Essential (primary) hypertension: Secondary | ICD-10-CM

## 2018-09-14 DIAGNOSIS — M25552 Pain in left hip: Secondary | ICD-10-CM | POA: Diagnosis not present

## 2018-09-14 DIAGNOSIS — E8881 Metabolic syndrome: Secondary | ICD-10-CM | POA: Diagnosis not present

## 2018-09-14 DIAGNOSIS — E782 Mixed hyperlipidemia: Secondary | ICD-10-CM | POA: Diagnosis not present

## 2018-09-14 DIAGNOSIS — Z0000 Encounter for general adult medical examination without abnormal findings: Secondary | ICD-10-CM

## 2018-09-14 MED ORDER — FLUTICASONE PROPIONATE 50 MCG/ACT NA SUSP: NASAL | 5 refills | Status: DC

## 2018-09-14 MED ORDER — BENAZEPRIL HCL 20 MG PO TABS: 20.0000 mg | ORAL_TABLET | Freq: Every day | ORAL | 3 refills | Status: DC

## 2018-09-14 MED ORDER — ATORVASTATIN CALCIUM 20 MG PO TABS: 20.0000 mg | ORAL_TABLET | Freq: Every day | ORAL | 3 refills | Status: DC

## 2018-09-14 MED ORDER — LEVOTHYROXINE SODIUM 75 MCG PO TABS: 75.0000 ug | ORAL_TABLET | Freq: Every day | ORAL | 3 refills | Status: DC

## 2018-09-14 NOTE — Addendum Note (Signed)
Addended by: Zannie Cove on: 09/14/2018 02:44 PM   Modules accepted: Orders

## 2018-09-14 NOTE — Progress Notes (Signed)
Virtual Visit Via telephone Note I connected with@ on 09/14/18 by telephone and verified that I am speaking with the correct person or authorized healthcare agent using two identifiers. Shawn Ochoa is currently located at home and there are no unauthorized people in close proximity. I completed this visit while in a private location in my home .  This visit type was conducted due to national recommendations for restrictions regarding the COVID-19 Pandemic (e.g. social distancing).  This format is felt to be most appropriate for this patient at this time.  All issues noted in this document were discussed and addressed.  No physical exam was performed.    I discussed the limitations, risks, security and privacy concerns of performing an evaluation and management service by telephone and the availability of in person appointments. I also discussed with the patient that there may be a patient responsible charge related to this service. The patient expressed understanding and agreed to proceed.   Date:  09/14/2018    ID:  Rene Kocher      1951/01/18        [id number]   Patient Care Team Patient Care Team: Chipper Herb, MD as PCP - General (Family Medicine)  Reason for Visit: Primary Care Follow-up     History of Present Illness & Review of Systems:     Shawn Ochoa is a 68 y.o. year old male primary care patient that presents today for a telehealth visit.  Patient is pleasant and doing well.  He denies having any chest pain pressure tightness or shortness of breath.  He does admit to shortness of breath with climbing slight inclines.  He does walk about a mile a day and gets more walking and when he is mowing the grass.  He does not have any PND.  He denies any trouble with nausea vomiting diarrhea blood in the stool or black tarry bowel movements and knows that he is due for repeat colonoscopy this fall by Dr. Britta Mccreedy.  He is passing his water well but frequency  is a problem and a slow stream is a problem.  He has plans to see his urologist, Dr. Jeffie Pollock soon we usually do the PSAs for him.  He understands to make this appointment.   Review of systems as stated, otherwise negative.  The patient does not have symptoms concerning for COVID-19 infection (fever, chills, cough, or new shortness of breath).      Current Medications (Verified) Allergies as of 09/14/2018      Reactions   Mobic [meloxicam] Other (See Comments)   Rectal bleeding with only 2 pills       Medication List       Accurate as of September 14, 2018  7:34 AM. If you have any questions, ask your nurse or doctor.        aspirin 81 MG EC tablet Take 81 mg by mouth daily.   atorvastatin 20 MG tablet Commonly known as:  LIPITOR TAKE 1 TABLET BY MOUTH EVERY DAY   benazepril 20 MG tablet Commonly known as:  LOTENSIN TAKE 1 TABLET BY MOUTH EVERY DAY   CALCIUM + D PO Take 1 capsule by mouth daily.   cephALEXin 500 MG capsule Commonly known as:  KEFLEX Take 1 capsule (500 mg total) by mouth 3 (three) times daily.   Fish Oil 1000 MG Caps Take 1 capsule by mouth 2 (two) times daily.   fluticasone 50 MCG/ACT nasal spray Commonly known  as:  FLONASE SPRAY 2 SPRAYS INTO EACH NOSTRIL EVERY DAY   levothyroxine 75 MCG tablet Commonly known as:  SYNTHROID TAKE 1 TABLET BY MOUTH EVERY DAY   pioglitazone 30 MG tablet Commonly known as:  ACTOS TAKE 1 TABLET BY MOUTH EVERY DAY   Vitamin D3 125 MCG (5000 UT) Caps Take 1 capsule by mouth daily.           Allergies (Verified)    Mobic [meloxicam]  Past Medical History Past Medical History:  Diagnosis Date   Abnormal glucose    Adenomatous polyp    Cardiac dysrhythmia, unspecified    Cataract    Essential hypertension, benign    Hemorrhage of gastrointestinal tract, unspecified    Metabolic syndrome    Nontoxic multinodular goiter    Other and unspecified hyperlipidemia    Prostatism      Past Surgical  History:  Procedure Laterality Date   CATARACT EXTRACTION     KNEE CARTILAGE SURGERY Right    THYROIDECTOMY      Social History   Socioeconomic History   Marital status: Married    Spouse name: Not on file   Number of children: Not on file   Years of education: Not on file   Highest education level: Not on file  Occupational History   Not on file  Social Needs   Financial resource strain: Not on file   Food insecurity:    Worry: Not on file    Inability: Not on file   Transportation needs:    Medical: Not on file    Non-medical: Not on file  Tobacco Use   Smoking status: Never Smoker   Smokeless tobacco: Never Used  Substance and Sexual Activity   Alcohol use: Yes    Comment: rare   Drug use: No   Sexual activity: Not on file  Lifestyle   Physical activity:    Days per week: Not on file    Minutes per session: Not on file   Stress: Not on file  Relationships   Social connections:    Talks on phone: Not on file    Gets together: Not on file    Attends religious service: Not on file    Active member of club or organization: Not on file    Attends meetings of clubs or organizations: Not on file    Relationship status: Not on file  Other Topics Concern   Not on file  Social History Narrative   Not on file     Family History  Problem Relation Age of Onset   Diabetes Mother    Hypertension Mother    Heart disease Mother    Stroke Father    Hypertension Father    Cancer Maternal Uncle 62       colon   Cancer Other 20       colon      Labs/Other Tests and Data Reviewed:    Wt Readings from Last 3 Encounters:  01/27/18 224 lb (101.6 kg)  07/17/17 225 lb (102.1 kg)  01/06/17 223 lb (101.2 kg)   Temp Readings from Last 3 Encounters:  01/27/18 (!) 96.8 F (36 C) (Oral)  07/17/17 (!) 97.2 F (36.2 C) (Oral)  01/06/17 (!) 97.2 F (36.2 C) (Oral)   BP Readings from Last 3 Encounters:  01/27/18 112/67  07/17/17 125/79    01/06/17 118/75   Pulse Readings from Last 3 Encounters:  01/27/18 70  07/17/17 79  01/06/17 70  Lab Results  Component Value Date   HGBA1C 5.2% 11/07/2013   HGBA1C 5.3 05/09/2013   HGBA1C 5.5 12/06/2012   Lab Results  Component Value Date   LDLCALC 79 01/27/2018   CREATININE 1.33 (H) 01/27/2018       Chemistry      Component Value Date/Time   NA 140 01/27/2018 0900   K 4.2 01/27/2018 0900   CL 104 01/27/2018 0900   CO2 24 01/27/2018 0900   BUN 22 01/27/2018 0900   CREATININE 1.33 (H) 01/27/2018 0900      Component Value Date/Time   CALCIUM 9.3 01/27/2018 0900   ALKPHOS 52 01/27/2018 0900   AST 25 01/27/2018 0900   ALT 26 01/27/2018 0900   BILITOT 0.7 01/27/2018 0900         OBSERVATIONS/ OBJECTIVE:     Patient is pleasant and alert.  His blood pressures run between 124-134/80-84.  This morning the last reading was 124/80.  His weight is 216 he says it is down about 7 pounds from a previous weight but still too much and knows that he needs to do better with his diet.  He continues to have some back pain and left hip pain which sounds more osteoarthritic in nature.  He did see the orthopedic specialist and was told he had bursitis in the left hip.  The patient usually sees me about every 6 months.  We will schedule him to see Dr. Warrick Parisian in 6 months.  He will come by sooner and get blood work and he will follow-up himself with the urologist and the dermatologist.  We will schedule him for a wellness visit with a cardiologist just for cardiac follow-up because of increased risk factors.  Physical exam deferred due to nature of telephonic visit.  ASSESSMENT & PLAN    Time:   Today, I have spent 27 minutes with the patient via telephone discussing the above including Covid precautions.     Visit Diagnoses: 1. Mixed hyperlipidemia -Continue with atorvastatin and aggressive therapeutic lifestyle changes.  Continue with over-the-counter omega-3 fatty acids and  we will see if his insurance will pay for Vascepa.  2. Elevated PSA -Follow-up with urology as planned get PSA in this office.  3. Family history of colon cancer -Repeat colonoscopy due this fall by Dr. Britta Mccreedy  4. Essential hypertension -Continue current blood pressure treatment watch sodium intake and lose weight  5. Vitamin D deficiency -Continue with vitamin D replacement pending results of lab work  6. Left hip pain -Follow-up with orthopedics as planned  7. Metabolic syndrome -Continue with aggressive therapeutic lifestyle changes geared to weight loss.  Patient Instructions  Continue to practice good respiratory and hand hygiene Come to the office for lab work We will schedule you to come back in about 3 to 4 months and have a visit with another provider that we will do your rectal exam and get acquainted with your history. Follow-up with urology and schedule appointment Follow-up with dermatology We will arrange for you to see Dr. Sallyanne Kuster for a cardiac evaluation because of increased risk factors     The above assessment and management plan was discussed with the patient. The patient verbalized understanding of and has agreed to the management plan. Patient is aware to call the clinic if symptoms persist or worsen. Patient is aware when to return to the clinic for a follow-up visit. Patient educated on when it is appropriate to go to the emergency department.    Estella Husk.  [address], [address], [place], [place] [address] Ph [phone]   Arrie Senate MD

## 2018-09-14 NOTE — Patient Instructions (Addendum)
Continue to practice good respiratory and hand hygiene Come to the office for lab work We will schedule you to come back in about 3 to 4 months and have a visit with another provider that we will do your rectal exam and get acquainted with your history. Follow-up with urology and schedule appointment Follow-up with dermatology We will arrange for you to see Dr. Sallyanne Kuster for a cardiac evaluation because of increased risk factors

## 2018-09-14 NOTE — Addendum Note (Signed)
Addended by: Zannie Cove on: 09/14/2018 03:13 PM   Modules accepted: Orders

## 2018-09-22 ENCOUNTER — Ambulatory Visit (INDEPENDENT_AMBULATORY_CARE_PROVIDER_SITE_OTHER): Payer: Medicare HMO | Admitting: *Deleted

## 2018-09-22 VITALS — Ht 70.0 in | Wt 224.0 lb

## 2018-09-22 DIAGNOSIS — Z0000 Encounter for general adult medical examination without abnormal findings: Secondary | ICD-10-CM | POA: Diagnosis not present

## 2018-09-22 NOTE — Patient Instructions (Signed)
  [name] , Thank you for taking time to come for your Medicare Wellness Visit. I appreciate your ongoing commitment to your health goals. Please review the following plan we discussed and let me know if I can assist you in the future.   These are the goals we discussed: Goals    . Client will verbalize understanding of resources for managing BMI     . Have 3 meals a day     Eat 3 healthy meals daily that consist of lean proteins, fresh fruits and vegetables.        This is a list of the screening recommended for you and due dates:  Health Maintenance  Topic Date Due  . Flu Shot  11/13/2018  . Colon Cancer Screening  01/26/2019  . Stool Blood Test  05/18/2019  . Tetanus Vaccine  01/28/2028  .  Hepatitis C: One time screening is recommended by Center for Disease Control  (CDC) for  adults born from [birth year] through 1967.   Completed  . Pneumonia vaccines  Completed

## 2018-09-22 NOTE — Progress Notes (Signed)
MEDICARE ANNUAL WELLNESS VISIT  09/22/2018  Telephone Visit Disclaimer This Medicare AWV was conducted by telephone due to national recommendations for restrictions regarding the COVID-19 Pandemic (e.g. social distancing).  I verified, using two identifiers, that I am speaking with Shawn Ochoa or their authorized healthcare agent. I discussed the limitations, risks, security, and privacy concerns of performing an evaluation and management service by telephone and the potential availability of an in-person appointment in the future. The patient expressed understanding and agreed to proceed.   Subjective:  Shawn Ochoa is a 68 y.o. male patient of Chipper Herb, MD who had a Medicare Annual Wellness Visit today via telephone. Shawn Ochoa is Working part time and lives with their spouse. he has 3 children. he reports that he is socially active and does interact with friends/family regularly. he is minimally physically active and enjoys gardening.  Patient Care Team: Chipper Herb, MD as PCP - General (Family Medicine)  Advanced Directives 09/22/2018  Does Patient Have a Medical Advance Directive? Yes  Type of Advance Directive Living will;Healthcare Power of Attorney  Does patient want to make changes to medical advance directive? No - Patient declined  Copy of [place] in Chart? No - copy requested    Hospital Utilization Over the Past 12 Months: # of hospitalizations or ER visits: 0 # of surgeries: 0  Review of Systems    Patient reports that his overall health is unchanged compared to last year.  Patient Reported Readings (BP, Pulse, CBG, Weight, etc) none  Review of Systems: History obtained from chart review and the patient General ROS: negative  All other systems negative.  Pain Assessment Pain : No/denies pain     Current Medications & Allergies (verified) Allergies as of 09/22/2018      Reactions   Mobic [meloxicam] Other  (See Comments)   Rectal bleeding with only 2 pills       Medication List       Accurate as of September 22, 2018  9:04 AM. If you have any questions, ask your nurse or doctor.        aspirin 81 MG EC tablet Take 81 mg by mouth daily.   atorvastatin 20 MG tablet Commonly known as:  LIPITOR Take 1 tablet (20 mg total) by mouth daily.   benazepril 20 MG tablet Commonly known as:  LOTENSIN Take 1 tablet (20 mg total) by mouth daily.   CALCIUM + D PO Take 1 capsule by mouth daily.   cephALEXin 500 MG capsule Commonly known as:  KEFLEX Take 1 capsule (500 mg total) by mouth 3 (three) times daily.   Fish Oil 1000 MG Caps Take 1 capsule by mouth 2 (two) times daily.   fluticasone 50 MCG/ACT nasal spray Commonly known as:  FLONASE SPRAY 2 SPRAYS INTO EACH NOSTRIL EVERY DAY   levothyroxine 75 MCG tablet Commonly known as:  SYNTHROID Take 1 tablet (75 mcg total) by mouth daily.   pioglitazone 30 MG tablet Commonly known as:  ACTOS TAKE 1 TABLET BY MOUTH EVERY DAY   Vitamin D3 125 MCG (5000 UT) Caps Take 1 capsule by mouth daily.       History (reviewed): Past Medical History:  Diagnosis Date  . Abnormal glucose   . Adenomatous polyp   . Cardiac dysrhythmia, unspecified   . Cataract   . Essential hypertension, benign   . Hemorrhage of gastrointestinal tract, unspecified   . Metabolic syndrome   . Nontoxic  multinodular goiter   . Other and unspecified hyperlipidemia   . Prostatism    Past Surgical History:  Procedure Laterality Date  . CATARACT EXTRACTION    . KNEE CARTILAGE SURGERY Right   . THYROIDECTOMY     Family History  Problem Relation Age of Onset  . Diabetes Mother   . Hypertension Mother   . Heart disease Mother   . Stroke Father   . Hypertension Father   . Cancer Maternal Uncle 19       colon  . Cancer Other 20       colon   Social History   Socioeconomic History  . Marital status: Married    Spouse name: Hilda Blades  . Number of children: 3   . Years of education: Not on file  . Highest education level: Professional school degree (e.g., MD, DDS, DVM, JD)  Occupational History  . Not on file  Social Needs  . Financial resource strain: Not hard at all  . Food insecurity:    Worry: Never true    Inability: Never true  . Transportation needs:    Medical: No    Non-medical: No  Tobacco Use  . Smoking status: Never Smoker  . Smokeless tobacco: Never Used  Substance and Sexual Activity  . Alcohol use: Yes    Comment: rare  . Drug use: No  . Sexual activity: Not Currently  Lifestyle  . Physical activity:    Days per week: 7 days    Minutes per session: 30 min  . Stress: Not at all  Relationships  . Social connections:    Talks on phone: Twice a week    Gets together: More than three times a week    Attends religious service: More than 4 times per year    Active member of club or organization: Yes    Attends meetings of clubs or organizations: More than 4 times per year    Relationship status: Married  Other Topics Concern  . Not on file  Social History Narrative  . Not on file    Activities of Daily Living In your present state of health, do you have any difficulty performing the following activities: 09/22/2018  Hearing? N  Vision? N  Difficulty concentrating or making decisions? N  Walking or climbing stairs? N  Dressing or bathing? N  Doing errands, shopping? N  Preparing Food and eating ? N  Using the Toilet? N  In the past six months, have you accidently leaked urine? N  Do you have problems with loss of bowel control? N  Managing your Medications? N  Managing your Finances? N  Housekeeping or managing your Housekeeping? N  Some recent data might be hidden    Patient Literacy How often do you need to have someone help you when you read instructions, pamphlets, or other written materials from your doctor or pharmacy?: 1 - Never What is the last grade level you completed in school?: Law School   Exercise Current Exercise Habits: Home exercise routine, Type of exercise: walking, Time (Minutes): 30, Frequency (Times/Week): 7, Weekly Exercise (Minutes/Week): 210, Intensity: Mild  Diet Patient reports consuming 3 meals a day and 1 snack(s) a day Patient reports that his primary diet is: Regular Patient reports that she does have regular access to food.   Depression Screen PHQ 2/9 Scores 09/22/2018 01/27/2018 07/17/2017 01/06/2017 06/18/2016 11/28/2015 05/28/2015  PHQ - 2 Score 0 0 0 0 0 0 0     Fall Risk Fall  Risk  09/22/2018 01/27/2018 07/17/2017 01/06/2017 06/18/2016  Falls in the past year? 0 No No No No  Number falls in past yr: 0 - - - -  Injury with Fall? 0 - - - -     Objective:  Shawn Ochoa seemed alert and oriented and he participated appropriately during our telephone visit.  Blood Pressure Weight BMI  BP Readings from Last 3 Encounters:  01/27/18 112/67  07/17/17 125/79  01/06/17 118/75   Wt Readings from Last 3 Encounters:  09/22/18 224 lb (101.6 kg)  01/27/18 224 lb (101.6 kg)  07/17/17 225 lb (102.1 kg)   BMI Readings from Last 1 Encounters:  09/22/18 32.14 kg/m    *Unable to obtain current vital signs, weight, and BMI due to telephone visit type  Hearing/Vision  . Shawn Ochoa did not seem to have difficulty with hearing/understanding during the telephone conversation . Reports that he has had a formal eye exam by an eye care professional within the past year . Reports that he has not had a formal hearing evaluation within the past year *Unable to fully assess hearing and vision during telephone visit type  Cognitive Function: 6CIT Screen 09/22/2018  What Year? 0 points  What month? 0 points  What time? 0 points  Count back from 20 0 points  Months in reverse 0 points  Repeat phrase 0 points  Total Score 0    Normal Cognitive Function Screening: Yes (Normal:0-7, Significant for Dysfunction: >8)  Immunization & Health Maintenance Record Immunization  History  Administered Date(s) Administered  . Influenza Split 01/15/2016  . Influenza Whole 01/12/2009  . Influenza, High Dose Seasonal PF 01/06/2017, 01/27/2018  . Influenza-Unspecified 01/12/2014, 02/22/2015  . Pneumococcal Conjugate-13 05/12/2014  . Pneumococcal Polysaccharide-23 06/12/1996, 06/18/2016  . Td 02/12/2005, 01/27/2018  . Tdap 01/10/2010  . Zoster 07/26/2009    Health Maintenance  Topic Date Due  . INFLUENZA VACCINE  11/13/2018  . COLONOSCOPY  01/26/2019  . COLON CANCER SCREENING ANNUAL FOBT  05/18/2019  . TETANUS/TDAP  01/28/2028  . Hepatitis C Screening  Completed  . PNA vac Low Risk Adult  Completed       Assessment  This is a routine wellness examination for Shawn Ochoa.  Health Maintenance: Due or Overdue There are no preventive care reminders to display for this patient.  Shawn Ochoa does not need a referral for Community Assistance: Care Management:   no Social Work:    no Prescription Assistance:  no Nutrition/Diabetes Education:  no   Plan:  Personalized Goals Goals Addressed            This Visit's Progress   . Client will verbalize understanding of resources for managing BMI       . Have 3 meals a day       Eat 3 healthy meals daily that consist of lean proteins, fresh fruits and vegetables.       Personalized Health Maintenance & Screening Recommendations  Up to date  Lung Cancer Screening Recommended: no (Low Dose CT Chest recommended if Age 92-80 years, 30 pack-year currently smoking OR have quit w/in past 15 years) Hepatitis C Screening recommended: no HIV Screening recommended: no  Advanced Directives: Written information was not prepared per patient's request.  Referrals & Orders No orders of the defined types were placed in this encounter.   Follow-up Plan . Follow-up with Chipper Herb, MD as planned   I have personally reviewed and noted the following in the patient's chart:   .  Medical and  social history . Use of alcohol, tobacco or illicit drugs  . Current medications and supplements . Functional ability and status . Nutritional status . Physical activity . Advanced directives . List of other physicians . Hospitalizations, surgeries, and ER visits in previous 12 months . Vitals . Screenings to include cognitive, depression, and falls . Referrals and appointments  In addition, I have reviewed and discussed with Shawn Ochoa certain preventive protocols, quality metrics, and best practice recommendations. A written personalized care plan for preventive services as well as general preventive health recommendations is available and can be mailed to the patient at his request.      Wardell Heath  09/22/2018

## 2018-12-01 ENCOUNTER — Other Ambulatory Visit: Payer: Self-pay | Admitting: Family Medicine

## 2019-01-07 ENCOUNTER — Telehealth: Payer: Self-pay | Admitting: Family Medicine

## 2019-01-07 NOTE — Telephone Encounter (Signed)
Appt made with Shawn Ochoa to est. Care.

## 2019-01-20 ENCOUNTER — Other Ambulatory Visit: Payer: Self-pay | Admitting: Physician Assistant

## 2019-01-25 ENCOUNTER — Other Ambulatory Visit: Payer: Self-pay

## 2019-01-26 ENCOUNTER — Ambulatory Visit (INDEPENDENT_AMBULATORY_CARE_PROVIDER_SITE_OTHER): Payer: Medicare HMO | Admitting: Family Medicine

## 2019-01-26 ENCOUNTER — Encounter: Payer: Self-pay | Admitting: Family Medicine

## 2019-01-26 VITALS — BP 120/64 | HR 70 | Temp 97.1°F | Resp 20 | Ht 70.0 in | Wt 220.0 lb

## 2019-01-26 DIAGNOSIS — R972 Elevated prostate specific antigen [PSA]: Secondary | ICD-10-CM

## 2019-01-26 DIAGNOSIS — E8881 Metabolic syndrome: Secondary | ICD-10-CM

## 2019-01-26 DIAGNOSIS — E782 Mixed hyperlipidemia: Secondary | ICD-10-CM

## 2019-01-26 DIAGNOSIS — E559 Vitamin D deficiency, unspecified: Secondary | ICD-10-CM

## 2019-01-26 DIAGNOSIS — E890 Postprocedural hypothyroidism: Secondary | ICD-10-CM | POA: Diagnosis not present

## 2019-01-26 DIAGNOSIS — I10 Essential (primary) hypertension: Secondary | ICD-10-CM

## 2019-01-26 DIAGNOSIS — Z23 Encounter for immunization: Secondary | ICD-10-CM | POA: Diagnosis not present

## 2019-01-26 DIAGNOSIS — Z800 Family history of malignant neoplasm of digestive organs: Secondary | ICD-10-CM | POA: Diagnosis not present

## 2019-01-26 LAB — BAYER DCA HB A1C WAIVED: HB A1C (BAYER DCA - WAIVED): 5.6 % (ref <7.0)

## 2019-01-26 NOTE — Addendum Note (Signed)
Addended by: Zannie Cove on: 01/26/2019 04:17 PM   Modules accepted: Orders

## 2019-01-26 NOTE — Progress Notes (Signed)
Subjective:  Patient ID: Shawn Ochoa, male    DOB: March 19, 1951, 68 y.o.   MRN: [id number]  Patient Care Team: Baruch Gouty, FNP as PCP - General (Family Medicine)   Chief Complaint:  Medical Management of Chronic Issues, Hyperlipidemia, and Hypertension   HPI: Shawn Ochoa is a 68 y.o. male presenting on 01/26/2019 for Medical Management of Chronic Issues, Hyperlipidemia, and Hypertension   1. Mixed hyperlipidemia Compliant with medications - Yes Current medications - atorvastatin Side effects from medications - No Diet - generally healthy Exercise - more active over last several months  Lab Results  Component Value Date   CHOL 147 01/27/2018   HDL 56 01/27/2018   LDLCALC 79 01/27/2018   TRIG 61 01/27/2018   CHOLHDL 2.6 01/27/2018     Family and personal medical history reviewed. Smoking and ETOH history reviewed.    2. Essential hypertension Complaint with meds - Yes Current Medications - Lotensin Checking BP at home ranging 115-120/68-70 Exercising Regularly - Yes Watching Salt intake - Yes Pertinent ROS:  Headache - No Fatigue - No Visual Disturbances - No Chest pain - No Dyspnea - No Palpitations - No LE edema - No They report good compliance with medications and can restate their regimen by memory. No medication side effects.  Family, social, and smoking history reviewed.   BP Readings from Last 3 Encounters:  01/26/19 120/64  01/27/18 112/67  07/17/17 125/79   CMP Latest Ref Rng & Units 01/27/2018 07/17/2017 01/06/2017  Glucose 65 - 99 mg/dL 93 94 91  BUN 8 - 27 mg/dL _0 Creatinine 0.76 - 1.27 mg/dL 1.33(H) 1.18 1.19  Sodium 134 - 144 mmol/L 140 144 140  Potassium 3.5 - 5.2 mmol/L 4.2 4.6 4.5  Chloride 96 - 106 mmol/L 104 104 101  CO2 20 - 29 mmol/L _1 Calcium 8.6 - 10.2 mg/dL 9.3 9.4 9.1  Total Protein 6.0 - 8.5 g/dL 6.2 6.1 6.8  Total Bilirubin 0.0 - 1.2 mg/dL 0.7 0.7 0.6  Alkaline Phos 39 - 117 IU/L 52 55  62  AST 0 - 40 IU/L _2 ALT 0 - 44 IU/L _3 3. Postoperative hypothyroidism Compliant with medications - Yes Current medications - Synthroid 75 mcg Adverse side effects - No Weight - Stable  Bowel habit changes - No Heat or cold intolerance - No Mood changes - No Changes in sleep habits - No Fatigue - No Skin, hair, or nail changes - No Tremor - No Palpitations - No Edema - No Shortness of breath - No  Lab Results  Component Value Date   TSH 1.020 01/06/2017     4. Vitamin D deficiency Pt is taking oral repletion therapy. Denies bone pain and tenderness, muscle weakness, fracture, and difficulty walking. Lab Results  Component Value Date   VD25OH 57.9 01/27/2018   VD25OH 62.0 07/17/2017   VD25OH 62.9 01/06/2017   Lab Results  Component Value Date   CALCIUM 9.3 01/27/2018      5. Elevated PSA Elevated PSA in the past. Denies hesitancy, urgency, weak stream, or rectal pressure or pain. No hematuria or anuria. States he does void at least once per night but does not awake just to void. States he wakes due to arthritic pain and uses the bathroom when he gets up.   6. Family history of colon cancer Pt is due for colonoscopy this year. Pt see  GI in [place], [place]. Pt states he will call to schedule an appointment.   7. Metabolic syndrome Pt has modified his diet a great deal. Has cut out most sugars. Has been trying to be more active.      Relevant past medical, surgical, family, and social history reviewed and updated as indicated.  Allergies and medications reviewed and updated. Date reviewed: Chart in Epic.   Past Medical History:  Diagnosis Date  . Abnormal glucose   . Adenomatous polyp   . Cardiac dysrhythmia, unspecified   . Cataract   . Essential hypertension, benign   . Hemorrhage of gastrointestinal tract, unspecified   . Metabolic syndrome   . Nontoxic multinodular goiter   . Other and unspecified hyperlipidemia   . Prostatism      Past Surgical History:  Procedure Laterality Date  . CATARACT EXTRACTION    . KNEE CARTILAGE SURGERY Right   . THYROIDECTOMY      Social History   Socioeconomic History  . Marital status: Married    Spouse name: Hilda Blades  . Number of children: 3  . Years of education: Not on file  . Highest education level: Professional school degree (e.g., MD, DDS, DVM, JD)  Occupational History  . Not on file  Social Needs  . Financial resource strain: Not hard at all  . Food insecurity    Worry: Never true    Inability: Never true  . Transportation needs    Medical: No    Non-medical: No  Tobacco Use  . Smoking status: Never Smoker  . Smokeless tobacco: Never Used  Substance and Sexual Activity  . Alcohol use: Yes    Comment: rare  . Drug use: No  . Sexual activity: Not Currently  Lifestyle  . Physical activity    Days per week: 7 days    Minutes per session: 30 min  . Stress: Not at all  Relationships  . Social Herbalist on phone: Twice a week    Gets together: More than three times a week    Attends religious service: More than 4 times per year    Active member of club or organization: Yes    Attends meetings of clubs or organizations: More than 4 times per year    Relationship status: Married  . Intimate partner violence    Fear of current or ex partner: No    Emotionally abused: No    Physically abused: No    Forced sexual activity: No  Other Topics Concern  . Not on file  Social History Narrative  . Not on file    Outpatient Encounter Medications as of 01/26/2019  Medication Sig  . aspirin 81 MG EC tablet Take 81 mg by mouth daily.    Marland Kitchen atorvastatin (LIPITOR) 20 MG tablet Take 1 tablet (20 mg total) by mouth daily.  . benazepril (LOTENSIN) 20 MG tablet Take 1 tablet (20 mg total) by mouth daily.  . Calcium Carbonate-Vitamin D (CALCIUM + D PO) Take 1 capsule by mouth daily.    . Cholecalciferol (VITAMIN D3) 5000 UNITS CAPS Take 1 capsule by mouth daily.     . fluticasone (FLONASE) 50 MCG/ACT nasal spray SPRAY 2 SPRAYS INTO EACH NOSTRIL EVERY DAY  . levothyroxine (SYNTHROID) 75 MCG tablet Take 1 tablet (75 mcg total) by mouth daily.  . Omega-3 Fatty Acids (FISH OIL) 1000 MG CAPS Take 1 capsule by mouth 2 (two) times daily.    . pioglitazone (ACTOS) 30 MG  tablet TAKE 1 TABLET BY MOUTH EVERY DAY  . [DISCONTINUED] cephALEXin (KEFLEX) 500 MG capsule Take 1 capsule (500 mg total) by mouth 3 (three) times daily.   No facility-administered encounter medications on file as of 01/26/2019.     Allergies  Allergen Reactions  . Mobic [Meloxicam] Other (See Comments)    Rectal bleeding with only 2 pills     Review of Systems  Constitutional: Negative for activity change, appetite change, chills, diaphoresis, fatigue, fever and unexpected weight change.  HENT: Negative.   Eyes: Negative.  Negative for photophobia and visual disturbance.  Respiratory: Negative for cough, chest tightness and shortness of breath.   Cardiovascular: Negative for chest pain, palpitations and leg swelling.  Gastrointestinal: Negative for abdominal distention, abdominal pain, anal bleeding, blood in stool, constipation, diarrhea, nausea and vomiting.  Endocrine: Negative.  Negative for cold intolerance, heat intolerance, polydipsia, polyphagia and polyuria.  Genitourinary: Negative for decreased urine volume, difficulty urinating, dysuria, flank pain, frequency, hematuria and urgency.  Musculoskeletal: Negative for arthralgias and myalgias.  Skin: Negative.   Allergic/Immunologic: Negative.   Neurological: Negative for dizziness, tremors, seizures, syncope, facial asymmetry, speech difficulty, weakness, light-headedness, numbness and headaches.  Hematological: Negative.   Psychiatric/Behavioral: Negative for confusion, hallucinations, sleep disturbance and suicidal ideas.  All other systems reviewed and are negative.       Objective:  BP 120/64   Pulse 70   Temp (!)  97.1 F (36.2 C)   Resp 20   Ht _0  (1.778 m)   Wt 220 lb (99.8 kg)   SpO2 100%   BMI 31.57 kg/m    Wt Readings from Last 3 Encounters:  01/26/19 220 lb (99.8 kg)  09/22/18 224 lb (101.6 kg)  01/27/18 224 lb (101.6 kg)    Physical Exam Vitals signs and nursing note reviewed.  Constitutional:      General: He is not in acute distress.    Appearance: Normal appearance. He is well-developed and well-groomed. He is obese. He is not ill-appearing, toxic-appearing or diaphoretic.  HENT:     Head: Normocephalic and atraumatic.     Jaw: There is normal jaw occlusion.     Right Ear: Hearing normal.     Left Ear: Hearing normal.     Nose: Nose normal.     Mouth/Throat:     Lips: Pink.     Mouth: Mucous membranes are moist.     Pharynx: Oropharynx is clear. Uvula midline.  Eyes:     General: Lids are normal.     Extraocular Movements: Extraocular movements intact.     Conjunctiva/sclera: Conjunctivae normal.     Pupils: Pupils are equal, round, and reactive to light.  Neck:     Musculoskeletal: Normal range of motion and neck supple.     Thyroid: No thyroid mass, thyromegaly or thyroid tenderness.     Vascular: No carotid bruit or JVD.     Trachea: Trachea and phonation normal.  Cardiovascular:     Rate and Rhythm: Normal rate and regular rhythm.     Chest Wall: PMI is not displaced.     Pulses: Normal pulses.     Heart sounds: Normal heart sounds. No murmur. No friction rub. No gallop.   Pulmonary:     Effort: Pulmonary effort is normal. No respiratory distress.     Breath sounds: Normal breath sounds. No wheezing.  Abdominal:     General: Abdomen is protuberant. Bowel sounds are normal. There is no distension or abdominal bruit.  Palpations: Abdomen is soft. There is no hepatomegaly or splenomegaly.     Tenderness: There is no abdominal tenderness. There is no right CVA tenderness or left CVA tenderness.     Hernia: No hernia is present.  Musculoskeletal:         General: No swelling.     Right lower leg: No edema.     Left lower leg: No edema.  Lymphadenopathy:     Cervical: No cervical adenopathy.  Skin:    General: Skin is warm and dry.     Capillary Refill: Capillary refill takes less than 2 seconds.     Coloration: Skin is not cyanotic, jaundiced or pale.     Findings: No rash.  Neurological:     General: No focal deficit present.     Mental Status: He is alert and oriented to person, place, and time.     Cranial Nerves: Cranial nerves are intact. No cranial nerve deficit.     Sensory: Sensation is intact. No sensory deficit.     Motor: Motor function is intact. No weakness.     Coordination: Coordination is intact. Coordination normal.     Gait: Gait is intact. Gait normal.     Deep Tendon Reflexes: Reflexes are normal and symmetric. Reflexes normal.  Psychiatric:        Attention and Perception: Attention and perception normal.        Mood and Affect: Mood and affect normal.        Speech: Speech normal.        Behavior: Behavior normal. Behavior is cooperative.        Thought Content: Thought content normal.        Cognition and Memory: Cognition and memory normal.        Judgment: Judgment normal.     Results for orders placed or performed in visit on 05/17/18  Fecal occult blood, imunochemical   Specimen: Stool   STOOL  Result Value Ref Range   Fecal Occult Bld Negative Negative       Pertinent labs & imaging results that were available during my care of the patient were reviewed by me and considered in my medical decision making.  Assessment & Plan:  Gaspare was seen today for medical management of chronic issues, hyperlipidemia and hypertension.  Diagnoses and all orders for this visit:  Mixed hyperlipidemia Diet encouraged - increase intake of fresh fruits and vegetables, increase intake of lean proteins. Bake, broil, or grill foods. Avoid fried, greasy, and fatty foods. Avoid fast foods. Increase intake of  fiber-rich whole grains. Exercise encouraged - at least 150 minutes per week and advance as tolerated.  Goal BMI < 25. Continue medications as prescribed. Follow up in 3-6 months as discussed.  -     CBC with Differential/Platelet  Essential hypertension BP well controlled. Changes were not made in regimen today. Goal BP is 130/80. Pt aware to report any persistent high or low readings. DASH diet and exercise encouraged. Exercise at least 150 minutes per week and increase as tolerated. Goal BMI > 25. Stress management encouraged. Avoid nicotine and tobacco product use. Avoid excessive alcohol and NSAID's. Avoid more than 2000 mg of sodium daily. Medications as prescribed. Follow up as scheduled.  -     CBC with Differential/Platelet -     CMP14+EGFR -     Lipid panel  Postoperative hypothyroidism Thyroid disease has been well controlled. Labs are pending. Adjustments to regimen will be made if warranted. Make  sure to take medications on an empty stomach with a full glass of water. Make sure to avoid vitamins or supplements for at least 4 hours before and 4 hours after taking medications. Repeat labs in 3 months if adjustments are made and in 6 months if stable.   -     Thyroid Panel With TSH  Vitamin D deficiency Labs pending. Continue repletion therapy. If indicated, will change repletion dosage. Eat foods rich in Vit D including milk, orange juice, yogurt with vitamin D added, salmon or mackerel, canned tuna fish, cereals with vitamin D added, and cod liver oil. Get out in the sun but make sure to wear at least SPF 30 sunscreen.  -     CBC with Differential/Platelet -     VITAMIN D 25 Hydroxy (Vit-D Deficiency, Fractures)  Elevated PSA Will recheck PSA today. Pt will follow up with Dr. Jeffie Pollock, urologist, as scheduled.  -     CBC with Differential/Platelet -     PSA, total and free  Family history of colon cancer Pt due for colonoscopy. Pt will schedule with GI in [place], [place].  -     CBC with  Differential/Platelet  Metabolic syndrome Diet and exercise encouraged. Labs pending.  -     CBC with Differential/Platelet -     Bayer DCA Hb A1c Waived  Need for immunization against influenza -     Flu Vaccine QUAD High Dose(Fluad)     Continue all other maintenance medications.  Follow up plan: Return in about 6 months (around 07/27/2019), or if symptoms worsen or fail to improve.  Continue healthy lifestyle choices, including diet (rich in fruits, vegetables, and lean proteins, and low in salt and simple carbohydrates) and exercise (at least 30 minutes of moderate physical activity daily).  Educational handout given for survey, COVID-19  The above assessment and management plan was discussed with the patient. The patient verbalized understanding of and has agreed to the management plan. Patient is aware to call the clinic if they develop any new symptoms or if symptoms persist or worsen. Patient is aware when to return to the clinic for a follow-up visit. Patient educated on when it is appropriate to go to the emergency department.   Monia Pouch, FNP-C [facility] [phone]

## 2019-01-26 NOTE — Patient Instructions (Signed)
It was a pleasure seeing you today, Mr. Kuder.  Information regarding what we discussed is included in this packet.  Please make an appointment to see me in 6 months.   In a few days you may receive a survey in the mail or online from [employer] regarding your visit with [place] today. Please take a moment to fill this out. Your feedback is very important to our office. It can help [place] better understand your needs as well as improve your experience and satisfaction. Thank you for taking your time to complete it. We care about you.  Because of recent events of COVID-19 ("Coronavirus"), please follow CDC recommendations:   1. Wash your hand frequently 2. Avoid touching your face 3. Stay away from people who are sick 4. If you have symptoms such as fever, cough, shortness of breath then call your healthcare provider for further guidance 5. If you are sick, STAY AT HOME, unless otherwise directed by your healthcare provider. 6. Follow directions from state and national officials regarding staying safe    Please feel free to call our office if any questions or concerns arise.  Warm Regards, Monia Pouch, FNP-C [facility] [address] [place], [place] [id number] [phone]

## 2019-01-27 LAB — CBC WITH DIFFERENTIAL/PLATELET
Basophils Absolute: 0 10*3/uL (ref 0.0–0.2)
Basos: 1 %
EOS (ABSOLUTE): 0.1 10*3/uL (ref 0.0–0.4)
Eos: 2 %
Hematocrit: 42.4 % (ref 37.5–51.0)
Hemoglobin: 13.9 g/dL (ref 13.0–17.7)
Immature Grans (Abs): 0 10*3/uL (ref 0.0–0.1)
Immature Granulocytes: 1 %
Lymphocytes Absolute: 1.4 10*3/uL (ref 0.7–3.1)
Lymphs: 30 %
MCH: 30.8 pg (ref 26.6–33.0)
MCHC: 32.8 g/dL (ref 31.5–35.7)
MCV: 94 fL (ref 79–97)
Monocytes Absolute: 0.6 10*3/uL (ref 0.1–0.9)
Monocytes: 13 %
Neutrophils Absolute: 2.4 10*3/uL (ref 1.4–7.0)
Neutrophils: 53 %
Platelets: 295 10*3/uL (ref 150–450)
RBC: 4.51 x10E6/uL (ref 4.14–5.80)
RDW: 12.6 % (ref 11.6–15.4)
WBC: 4.5 10*3/uL (ref 3.4–10.8)

## 2019-01-27 LAB — CMP14+EGFR
ALT: 26 IU/L (ref 0–44)
AST: 25 IU/L (ref 0–40)
Albumin/Globulin Ratio: 1.6 (ref 1.2–2.2)
Albumin: 4 g/dL (ref 3.8–4.8)
Alkaline Phosphatase: 55 IU/L (ref 39–117)
BUN/Creatinine Ratio: 20 (ref 10–24)
BUN: 22 mg/dL (ref 8–27)
Bilirubin Total: 0.6 mg/dL (ref 0.0–1.2)
CO2: 23 mmol/L (ref 20–29)
Calcium: 9.2 mg/dL (ref 8.6–10.2)
Chloride: 103 mmol/L (ref 96–106)
Creatinine, Ser: 1.12 mg/dL (ref 0.76–1.27)
GFR calc Af Amer: 77 mL/min/{1.73_m2} (ref >59)
GFR calc non Af Amer: 66 mL/min/{1.73_m2} (ref >59)
Globulin, Total: 2.5 g/dL (ref 1.5–4.5)
Glucose: 99 mg/dL (ref 65–99)
Potassium: 4.3 mmol/L (ref 3.5–5.2)
Sodium: 140 mmol/L (ref 134–144)
Total Protein: 6.5 g/dL (ref 6.0–8.5)

## 2019-01-27 LAB — PSA, TOTAL AND FREE
PSA, Free Pct: 27.8 %
PSA, Free: 0.64 ng/mL
Prostate Specific Ag, Serum: 2.3 ng/mL (ref 0.0–4.0)

## 2019-01-27 LAB — LIPID PANEL
Chol/HDL Ratio: 2.6 ratio (ref 0.0–5.0)
Cholesterol, Total: 154 mg/dL (ref 100–199)
HDL: 59 mg/dL (ref >39)
LDL Chol Calc (NIH): 82 mg/dL (ref 0–99)
Triglycerides: 62 mg/dL (ref 0–149)
VLDL Cholesterol Cal: 13 mg/dL (ref 5–40)

## 2019-01-27 LAB — THYROID PANEL WITH TSH
Free Thyroxine Index: 3 (ref 1.2–4.9)
T3 Uptake Ratio: 33 % (ref 24–39)
T4, Total: 9.1 ug/dL (ref 4.5–12.0)
TSH: 1.25 u[IU]/mL (ref 0.450–4.500)

## 2019-01-27 LAB — VITAMIN D 25 HYDROXY (VIT D DEFICIENCY, FRACTURES): Vit D, 25-Hydroxy: 60.4 ng/mL (ref 30.0–100.0)

## 2019-02-01 DIAGNOSIS — L578 Other skin changes due to chronic exposure to nonionizing radiation: Secondary | ICD-10-CM | POA: Diagnosis not present

## 2019-02-01 DIAGNOSIS — L819 Disorder of pigmentation, unspecified: Secondary | ICD-10-CM | POA: Diagnosis not present

## 2019-02-01 DIAGNOSIS — L814 Other melanin hyperpigmentation: Secondary | ICD-10-CM | POA: Diagnosis not present

## 2019-02-01 DIAGNOSIS — Z85828 Personal history of other malignant neoplasm of skin: Secondary | ICD-10-CM | POA: Diagnosis not present

## 2019-02-01 DIAGNOSIS — D229 Melanocytic nevi, unspecified: Secondary | ICD-10-CM | POA: Diagnosis not present

## 2019-02-01 DIAGNOSIS — L570 Actinic keratosis: Secondary | ICD-10-CM | POA: Diagnosis not present

## 2019-02-01 DIAGNOSIS — L821 Other seborrheic keratosis: Secondary | ICD-10-CM | POA: Diagnosis not present

## 2019-02-22 DIAGNOSIS — L570 Actinic keratosis: Secondary | ICD-10-CM | POA: Diagnosis not present

## 2019-02-22 DIAGNOSIS — L819 Disorder of pigmentation, unspecified: Secondary | ICD-10-CM | POA: Diagnosis not present

## 2019-02-22 DIAGNOSIS — Z85828 Personal history of other malignant neoplasm of skin: Secondary | ICD-10-CM | POA: Diagnosis not present

## 2019-03-17 ENCOUNTER — Other Ambulatory Visit: Payer: Self-pay | Admitting: Family Medicine

## 2019-06-09 ENCOUNTER — Other Ambulatory Visit: Payer: Self-pay | Admitting: Family Medicine

## 2019-06-28 ENCOUNTER — Encounter: Payer: Self-pay | Admitting: *Deleted

## 2019-06-29 ENCOUNTER — Encounter: Payer: Self-pay | Admitting: Family Medicine

## 2019-06-29 ENCOUNTER — Telehealth: Payer: Self-pay | Admitting: *Deleted

## 2019-06-29 ENCOUNTER — Telehealth (INDEPENDENT_AMBULATORY_CARE_PROVIDER_SITE_OTHER): Payer: Medicare HMO | Admitting: Family Medicine

## 2019-06-29 DIAGNOSIS — L237 Allergic contact dermatitis due to plants, except food: Secondary | ICD-10-CM | POA: Diagnosis not present

## 2019-06-29 MED ORDER — HYDROXYZINE HCL 25 MG PO TABS: 25.0000 mg | ORAL_TABLET | Freq: Three times a day (TID) | ORAL | 0 refills | Status: DC | PRN

## 2019-06-29 MED ORDER — PREDNISONE 10 MG (21) PO TBPK: ORAL_TABLET | ORAL | 0 refills | Status: DC

## 2019-06-29 NOTE — Progress Notes (Signed)
Virtual Visit via MyChart Video Note Due to COVID-19 pandemic this visit was conducted virtually. This visit type was conducted due to national recommendations for restrictions regarding the COVID-19 Pandemic (e.g. social distancing, sheltering in place) in an effort to limit this patient's exposure and mitigate transmission in our community. All issues noted in this document were discussed and addressed.  A physical exam was not performed with this format.   I connected with Shawn Ochoa on 06/29/2019 at 1545 by video and verified that I am speaking with the correct person using two identifiers. Shawn Ochoa is currently located at home and family is currently with them during visit. The provider, Monia Pouch, FNP is located in their office at time of visit.  I discussed the limitations, risks, security and privacy concerns of performing an evaluation and management service by telephone and the availability of in person appointments. I also discussed with the patient that there may be a patient responsible charge related to this service. The patient expressed understanding and agreed to proceed.  Subjective:  Patient ID: Shawn Ochoa, male    DOB: 1950/08/23, 69 y.o.   MRN: [id number]  Chief Complaint:  Rash   HPI: Shawn Ochoa is a 69 y.o. male presenting on 06/29/2019 for Rash   Rash This is a new problem. The current episode started in the past 7 days. The problem has been gradually worsening since onset. The affected locations include the left arm, right arm, left lower leg and right lower leg. The rash is characterized by blistering, itchiness, redness and bruising. He was exposed to plant contact. Pertinent negatives include no anorexia, congestion, cough, diarrhea, eye pain, facial edema, fatigue, fever, joint pain, nail changes, rhinorrhea, shortness of breath, sore throat or vomiting. Past treatments include anti-itch cream. The treatment provided no  relief.     Relevant past medical, surgical, family, and social history reviewed and updated as indicated.  Allergies and medications reviewed and updated.   Past Medical History:  Diagnosis Date  . Abnormal glucose   . Adenomatous polyp   . Cardiac dysrhythmia, unspecified   . Cataract   . Essential hypertension, benign   . Hemorrhage of gastrointestinal tract, unspecified   . Metabolic syndrome   . Nontoxic multinodular goiter   . Other and unspecified hyperlipidemia   . Prostatism     Past Surgical History:  Procedure Laterality Date  . CATARACT EXTRACTION    . KNEE CARTILAGE SURGERY Right   . THYROIDECTOMY      Social History   Socioeconomic History  . Marital status: Married    Spouse name: Hilda Blades  . Number of children: 3  . Years of education: Not on file  . Highest education level: Professional school degree (e.g., MD, DDS, DVM, JD)  Occupational History  . Not on file  Tobacco Use  . Smoking status: Never Smoker  . Smokeless tobacco: Never Used  Substance and Sexual Activity  . Alcohol use: Yes    Comment: rare  . Drug use: No  . Sexual activity: Not Currently  Other Topics Concern  . Not on file  Social History Narrative  . Not on file   Social Determinants of Health   Financial Resource Strain: Low Risk   . Difficulty of Paying Living Expenses: Not hard at all  Food Insecurity: No Food Insecurity  . Worried About Charity fundraiser in the Last Year: Never true  . Ran Out of Food in the Last Year:  Never true  Transportation Needs: No Transportation Needs  . Lack of Transportation (Medical): No  . Lack of Transportation (Non-Medical): No  Physical Activity: Sufficiently Active  . Days of Exercise per Week: 7 days  . Minutes of Exercise per Session: 30 min  Stress: No Stress Concern Present  . Feeling of Stress : Not at all  Social Connections: Not Isolated  . Frequency of Communication with Friends and Family: Twice a week  . Frequency of  Social Gatherings with Friends and Family: More than three times a week  . Attends Religious Services: More than 4 times per year  . Active Member of Clubs or Organizations: Yes  . Attends Archivist Meetings: More than 4 times per year  . Marital Status: Married  Human resources officer Violence: Not At Risk  . Fear of Current or Ex-Partner: No  . Emotionally Abused: No  . Physically Abused: No  . Sexually Abused: No    Outpatient Encounter Medications as of 06/29/2019  Medication Sig  . aspirin 81 MG EC tablet Take 81 mg by mouth daily.    Marland Kitchen atorvastatin (LIPITOR) 20 MG tablet Take 1 tablet (20 mg total) by mouth daily.  . benazepril (LOTENSIN) 20 MG tablet Take 1 tablet (20 mg total) by mouth daily.  . Calcium Carbonate-Vitamin D (CALCIUM + D PO) Take 1 capsule by mouth daily.    . Cholecalciferol (VITAMIN D3) 5000 UNITS CAPS Take 1 capsule by mouth daily.    . fluticasone (FLONASE) 50 MCG/ACT nasal spray SPRAY 2 SPRAYS INTO EACH NOSTRIL EVERY DAY  . hydrOXYzine (ATARAX/VISTARIL) 25 MG tablet Take 1 tablet (25 mg total) by mouth 3 (three) times daily as needed for itching.  . levothyroxine (SYNTHROID) 75 MCG tablet Take 1 tablet (75 mcg total) by mouth daily.  . Omega-3 Fatty Acids (FISH OIL) 1000 MG CAPS Take 1 capsule by mouth 2 (two) times daily.    . pioglitazone (ACTOS) 30 MG tablet TAKE 1 TABLET BY MOUTH EVERY DAY  . predniSONE (STERAPRED UNI-PAK 21 TAB) 10 MG (21) TBPK tablet As directed x 6 days   No facility-administered encounter medications on file as of 06/29/2019.    Allergies  Allergen Reactions  . Mobic [Meloxicam] Other (See Comments)    Rectal bleeding with only 2 pills     Review of Systems  Constitutional: Negative for activity change, appetite change, chills, diaphoresis, fatigue, fever and unexpected weight change.  HENT: Negative.  Negative for congestion, drooling, facial swelling, rhinorrhea, sore throat, trouble swallowing and voice change.   Eyes:  Negative.  Negative for photophobia, pain and visual disturbance.  Respiratory: Negative for apnea, cough, choking, chest tightness, shortness of breath and wheezing.   Cardiovascular: Negative for chest pain, palpitations and leg swelling.  Gastrointestinal: Negative for anorexia, blood in stool, constipation, diarrhea, nausea and vomiting.  Endocrine: Negative.   Genitourinary: Negative for dysuria, frequency and urgency.  Musculoskeletal: Negative for arthralgias, joint pain and myalgias.  Skin: Positive for color change and rash. Negative for nail changes.  Allergic/Immunologic: Negative.   Neurological: Negative for dizziness, syncope, weakness and headaches.  Hematological: Negative.   Psychiatric/Behavioral: Negative for confusion, hallucinations, sleep disturbance and suicidal ideas.  All other systems reviewed and are negative.        Observations/Objective: No vital signs or physical exam, this was a telephone or virtual health encounter.  Pt alert and oriented, answers all questions appropriately, and able to speak in full sentences.  Rash to bilateral forearms and  lower legs in video, erythematous vesicles in clusters. Clear drainage.   Assessment and Plan: Shawn Ochoa was seen today for rash.  Diagnoses and all orders for this visit:  Contact dermatitis due to poison ivy Pt exposed to poison ivy while moving brush from the yard. Noted rash in video consistent with poison ivy contact dermatitis, will treat with below. Symptomatic care discussed in detail. Sedation precautions given for Atarax. Report any new, worsening, or persistent symptoms.  -     predniSONE (STERAPRED UNI-PAK 21 TAB) 10 MG (21) TBPK tablet; As directed x 6 days -     hydrOXYzine (ATARAX/VISTARIL) 25 MG tablet; Take 1 tablet (25 mg total) by mouth 3 (three) times daily as needed for itching.     Follow Up Instructions: Return if symptoms worsen or fail to improve.    I discussed the assessment and  treatment plan with the patient. The patient was provided an opportunity to ask questions and all were answered. The patient agreed with the plan and demonstrated an understanding of the instructions.   The patient was advised to call back or seek an in-person evaluation if the symptoms worsen or if the condition fails to improve as anticipated.  The above assessment and management plan was discussed with the patient. The patient verbalized understanding of and has agreed to the management plan. Patient is aware to call the clinic if they develop any new symptoms or if symptoms persist or worsen. Patient is aware when to return to the clinic for a follow-up visit. Patient educated on when it is appropriate to go to the emergency department.    I provided 15 minutes of non-face-to-face time during this encounter. The video started at 1545. The video ended at 1600. The other time was used for coordination of care.    Monia Pouch, FNP-C [facility] [address] [place], [place] [address] [phone] 06/29/2019

## 2019-06-29 NOTE — Telephone Encounter (Signed)
Prior Auth for hydrOXYzine HCl 25MG  tablets-APPROVED  Key: [id number]   PA Case ID: [id number]   Your information has been submitted to [place] [employer]. [employer] will review the request and will issue a decision, typically within 1-3 days from your submission. You can check the updated outcome later by reopening this request.  If [employer] has not responded in 1-3 days or if you have any questions about your ePA request, please contact [place] [employer] Part D at [phone]. If you think there may be a problem with your PA request, use our live chat feature at the bottom right.  Pharmacy notified.

## 2019-07-21 ENCOUNTER — Encounter: Payer: Self-pay | Admitting: Family Medicine

## 2019-07-21 ENCOUNTER — Other Ambulatory Visit: Payer: Self-pay

## 2019-07-21 ENCOUNTER — Ambulatory Visit: Payer: Medicare HMO | Admitting: Family Medicine

## 2019-07-21 VITALS — BP 114/71 | HR 73 | Temp 98.7°F | Ht 70.0 in | Wt 218.8 lb

## 2019-07-21 DIAGNOSIS — Z125 Encounter for screening for malignant neoplasm of prostate: Secondary | ICD-10-CM

## 2019-07-21 DIAGNOSIS — J301 Allergic rhinitis due to pollen: Secondary | ICD-10-CM | POA: Insufficient documentation

## 2019-07-21 DIAGNOSIS — R7989 Other specified abnormal findings of blood chemistry: Secondary | ICD-10-CM

## 2019-07-21 DIAGNOSIS — I10 Essential (primary) hypertension: Secondary | ICD-10-CM

## 2019-07-21 DIAGNOSIS — E8881 Metabolic syndrome: Secondary | ICD-10-CM

## 2019-07-21 DIAGNOSIS — E890 Postprocedural hypothyroidism: Secondary | ICD-10-CM

## 2019-07-21 DIAGNOSIS — E782 Mixed hyperlipidemia: Secondary | ICD-10-CM

## 2019-07-21 DIAGNOSIS — E559 Vitamin D deficiency, unspecified: Secondary | ICD-10-CM | POA: Insufficient documentation

## 2019-07-21 MED ORDER — FLUTICASONE PROPIONATE 50 MCG/ACT NA SUSP: 1.0000 | Freq: Every day | NASAL | 3 refills | Status: DC

## 2019-07-21 MED ORDER — PIOGLITAZONE HCL 30 MG PO TABS: 30.0000 mg | ORAL_TABLET | Freq: Every day | ORAL | 3 refills | Status: DC

## 2019-07-21 MED ORDER — LEVOTHYROXINE SODIUM 75 MCG PO TABS: 75.0000 ug | ORAL_TABLET | Freq: Every day | ORAL | 3 refills | Status: DC

## 2019-07-21 MED ORDER — BENAZEPRIL HCL 20 MG PO TABS: 20.0000 mg | ORAL_TABLET | Freq: Every day | ORAL | 3 refills | Status: DC

## 2019-07-21 MED ORDER — ATORVASTATIN CALCIUM 20 MG PO TABS: 20.0000 mg | ORAL_TABLET | Freq: Every day | ORAL | 3 refills | Status: DC

## 2019-07-21 NOTE — Patient Instructions (Signed)
Health Maintenance After Age 71 After age 71, you are at a higher risk for certain long-term diseases and infections as well as injuries from falls. Falls are a major cause of broken bones and head injuries in people who are older than age 71. Getting regular preventive care can help to keep you healthy and well. Preventive care includes getting regular testing and making lifestyle changes as recommended by your health care provider. Talk with your health care provider about:  Which screenings and tests you should have. A screening is a test that checks for a disease when you have no symptoms.  A diet and exercise plan that is right for you. What should I know about screenings and tests to prevent falls? Screening and testing are the best ways to find a health problem early. Early diagnosis and treatment give you the best chance of managing medical conditions that are common after age 71. Certain conditions and lifestyle choices may make you more likely to have a fall. Your health care provider may recommend:  Regular vision checks. Poor vision and conditions such as cataracts can make you more likely to have a fall. If you wear glasses, make sure to get your prescription updated if your vision changes.  Medicine review. Work with your health care provider to regularly review all of the medicines you are taking, including over-the-counter medicines. Ask your health care provider about any side effects that may make you more likely to have a fall. Tell your health care provider if any medicines that you take make you feel dizzy or sleepy.  Osteoporosis screening. Osteoporosis is a condition that causes the bones to get weaker. This can make the bones weak and cause them to break more easily.  Blood pressure screening. Blood pressure changes and medicines to control blood pressure can make you feel dizzy.  Strength and balance checks. Your health care provider may recommend certain tests to check your  strength and balance while standing, walking, or changing positions.  Foot health exam. Foot pain and numbness, as well as not wearing proper footwear, can make you more likely to have a fall.  Depression screening. You may be more likely to have a fall if you have a fear of falling, feel emotionally low, or feel unable to do activities that you used to do.  Alcohol use screening. Using too much alcohol can affect your balance and may make you more likely to have a fall. What actions can I take to lower my risk of falls? General instructions  Talk with your health care provider about your risks for falling. Tell your health care provider if: ? You fall. Be sure to tell your health care provider about all falls, even ones that seem minor. ? You feel dizzy, sleepy, or off-balance.  Take over-the-counter and prescription medicines only as told by your health care provider. These include any supplements.  Eat a healthy diet and maintain a healthy weight. A healthy diet includes low-fat dairy products, low-fat (lean) meats, and fiber from whole grains, beans, and lots of fruits and vegetables. Home safety  Remove any tripping hazards, such as rugs, cords, and clutter.  Install safety equipment such as grab bars in bathrooms and safety rails on stairs.  Keep rooms and walkways well-lit. Activity   Follow a regular exercise program to stay fit. This will help you maintain your balance. Ask your health care provider what types of exercise are appropriate for you.  If you need a cane or   walker, use it as recommended by your health care provider.  Wear supportive shoes that have nonskid soles. Lifestyle  Do not drink alcohol if your health care provider tells you not to drink.  If you drink alcohol, limit how much you have: ? 0-1 drink a day for women. ? 0-2 drinks a day for men.  Be aware of how much alcohol is in your drink. In the [place] one drink equals one typical bottle of beer (12  oz), one-half glass of wine (5 oz), or one shot of hard liquor (1 oz).  Do not use any products that contain nicotine or tobacco, such as cigarettes and e-cigarettes. If you need help quitting, ask your health care provider. Summary  Having a healthy lifestyle and getting preventive care can help to protect your health and wellness after age 71.  Screening and testing are the best way to find a health problem early and help you avoid having a fall. Early diagnosis and treatment give you the best chance for managing medical conditions that are more common for people who are older than age 71.  Falls are a major cause of broken bones and head injuries in people who are older than age 71. Take precautions to prevent a fall at home.  Work with your health care provider to learn what changes you can make to improve your health and wellness and to prevent falls. This information is not intended to replace advice given to you by your health care provider. Make sure you discuss any questions you have with your health care provider. Document Revised: 07/22/2018 Document Reviewed: 02/11/2017 Elsevier Patient Education  2020 [employer]  

## 2019-07-21 NOTE — Progress Notes (Signed)
Subjective:  Patient ID: Shawn Ochoa, male    DOB: 01-31-1951, 69 y.o.   MRN: [id number]  Patient Care Team: Loman Brooklyn, FNP as PCP - General (Family Medicine)   Chief Complaint:  Medical Management of Chronic Issues   HPI: Shawn Ochoa is a 69 y.o. male presenting on 07/21/2019 for Medical Management of Chronic Issues   1. Metabolic syndrome Pt states he has not been following his diet as strictly as he was in the past. States he has not been as active due to the pandemic. Has lost a few pounds since last visit.   2. Mixed hyperlipidemia Pt is compliant with his statin therapy. No associated side effects. Has been trying to follow a healthy diet but feels he has not been as strict lately. Has been more active over the last few months.   3. Essential hypertension Pt is compliant with medications and tolerating them well. No chest pain, headaches, confusion, weakness, leg swelling, dizziness, palpitations, or syncope.   4. Postoperative hypothyroidism Compliant with repletion therapy and tolerating well. No hypo- or hyperthyroid symptoms reported.   5. Vitamin D deficiency On repletion therapy and tolerating well. No arthralgias or myalgias. No fatigue, mood changes, or skin changes.   6. Seasonal allergic rhinitis due to pollen Reports ongoing and worsening rhinorrhea and congestion with the season change. No fever, chills, cough, or shortness of breath. Does have sneezing at times.      Relevant past medical, surgical, family, and social history reviewed and updated as indicated.  Allergies and medications reviewed and updated. Date reviewed: Chart in Epic.   Past Medical History:  Diagnosis Date  . Abnormal glucose   . Adenomatous polyp   . Cardiac dysrhythmia, unspecified   . Cataract   . Essential hypertension, benign   . Hemorrhage of gastrointestinal tract, unspecified   . Metabolic syndrome   . Nontoxic multinodular goiter   . Other  and unspecified hyperlipidemia   . Prostatism     Past Surgical History:  Procedure Laterality Date  . CATARACT EXTRACTION    . KNEE CARTILAGE SURGERY Right   . THYROIDECTOMY      Social History   Socioeconomic History  . Marital status: Married    Spouse name: Hilda Blades  . Number of children: 3  . Years of education: Not on file  . Highest education level: Professional school degree (e.g., MD, DDS, DVM, JD)  Occupational History  . Not on file  Tobacco Use  . Smoking status: Never Smoker  . Smokeless tobacco: Never Used  Substance and Sexual Activity  . Alcohol use: Yes    Comment: rare  . Drug use: No  . Sexual activity: Not Currently  Other Topics Concern  . Not on file  Social History Narrative  . Not on file   Social Determinants of Health   Financial Resource Strain: Low Risk   . Difficulty of Paying Living Expenses: Not hard at all  Food Insecurity: No Food Insecurity  . Worried About Charity fundraiser in the Last Year: Never true  . Ran Out of Food in the Last Year: Never true  Transportation Needs: No Transportation Needs  . Lack of Transportation (Medical): No  . Lack of Transportation (Non-Medical): No  Physical Activity: Sufficiently Active  . Days of Exercise per Week: 7 days  . Minutes of Exercise per Session: 30 min  Stress: No Stress Concern Present  . Feeling of Stress : Not at  all  Social Connections: Not Isolated  . Frequency of Communication with Friends and Family: Twice a week  . Frequency of Social Gatherings with Friends and Family: More than three times a week  . Attends Religious Services: More than 4 times per year  . Active Member of Clubs or Organizations: Yes  . Attends Archivist Meetings: More than 4 times per year  . Marital Status: Married  Human resources officer Violence: Not At Risk  . Fear of Current or Ex-Partner: No  . Emotionally Abused: No  . Physically Abused: No  . Sexually Abused: No    Outpatient Encounter  Medications as of 07/21/2019  Medication Sig  . aspirin 81 MG EC tablet Take 81 mg by mouth daily.    Marland Kitchen atorvastatin (LIPITOR) 20 MG tablet Take 1 tablet (20 mg total) by mouth daily.  . benazepril (LOTENSIN) 20 MG tablet Take 1 tablet (20 mg total) by mouth daily.  . Calcium Carbonate-Vitamin D (CALCIUM + D PO) Take 1 capsule by mouth daily.    . Cholecalciferol (VITAMIN D3) 5000 UNITS CAPS Take 1 capsule by mouth daily.    . fluticasone (FLONASE) 50 MCG/ACT nasal spray Place 1 spray into both nostrils daily.  Marland Kitchen levothyroxine (SYNTHROID) 75 MCG tablet Take 1 tablet (75 mcg total) by mouth daily.  . Omega-3 Fatty Acids (FISH OIL) 1000 MG CAPS Take 1 capsule by mouth 2 (two) times daily.    . pioglitazone (ACTOS) 30 MG tablet Take 1 tablet (30 mg total) by mouth daily.  . [DISCONTINUED] atorvastatin (LIPITOR) 20 MG tablet Take 1 tablet (20 mg total) by mouth daily.  . [DISCONTINUED] benazepril (LOTENSIN) 20 MG tablet Take 1 tablet (20 mg total) by mouth daily.  . [DISCONTINUED] fluticasone (FLONASE) 50 MCG/ACT nasal spray SPRAY 2 SPRAYS INTO EACH NOSTRIL EVERY DAY  . [DISCONTINUED] hydrOXYzine (ATARAX/VISTARIL) 25 MG tablet Take 1 tablet (25 mg total) by mouth 3 (three) times daily as needed for itching.  . [DISCONTINUED] levothyroxine (SYNTHROID) 75 MCG tablet Take 1 tablet (75 mcg total) by mouth daily.  . [DISCONTINUED] pioglitazone (ACTOS) 30 MG tablet TAKE 1 TABLET BY MOUTH EVERY DAY  . [DISCONTINUED] predniSONE (STERAPRED UNI-PAK 21 TAB) 10 MG (21) TBPK tablet As directed x 6 days   No facility-administered encounter medications on file as of 07/21/2019.    Allergies  Allergen Reactions  . Mobic [Meloxicam] Other (See Comments)    Rectal bleeding with only 2 pills     Review of Systems  Constitutional: Negative for activity change, appetite change, chills, diaphoresis, fatigue and fever.  HENT: Positive for congestion, rhinorrhea and sneezing. Negative for dental problem, drooling, ear  discharge, ear pain, facial swelling, hearing loss, mouth sores, nosebleeds, postnasal drip, sinus pressure, sinus pain, sore throat, tinnitus, trouble swallowing and voice change.   Eyes: Negative.  Negative for photophobia and visual disturbance.  Respiratory: Negative for cough, chest tightness, shortness of breath and wheezing.   Cardiovascular: Negative for chest pain, palpitations and leg swelling.  Gastrointestinal: Negative for abdominal pain, blood in stool, constipation, diarrhea, nausea and vomiting.  Endocrine: Negative.  Negative for cold intolerance, heat intolerance, polydipsia, polyphagia and polyuria.  Genitourinary: Negative for decreased urine volume, difficulty urinating, dysuria, frequency and urgency.  Musculoskeletal: Negative for arthralgias and myalgias.  Skin: Negative.   Allergic/Immunologic: Negative.   Neurological: Negative for dizziness, tremors, seizures, syncope, facial asymmetry, speech difficulty, weakness, light-headedness, numbness and headaches.  Hematological: Negative.   Psychiatric/Behavioral: Negative for confusion, hallucinations, sleep disturbance  and suicidal ideas.  All other systems reviewed and are negative.       Objective:  BP 114/71   Pulse 73   Temp 98.7 F (37.1 C)   Ht '5\' 10"'  (1.778 m)   Wt 218 lb 12.8 oz (99.2 kg)   SpO2 99%   BMI 31.39 kg/m    Wt Readings from Last 3 Encounters:  07/21/19 218 lb 12.8 oz (99.2 kg)  01/26/19 220 lb (99.8 kg)  09/22/18 224 lb (101.6 kg)    Physical Exam Vitals and nursing note reviewed.  Constitutional:      General: He is not in acute distress.    Appearance: Normal appearance. He is well-developed and well-groomed. He is obese. He is not ill-appearing, toxic-appearing or diaphoretic.  HENT:     Head: Normocephalic and atraumatic.     Jaw: There is normal jaw occlusion.     Right Ear: Hearing normal.     Left Ear: Hearing normal.     Nose: Nose normal.     Mouth/Throat:     Lips:  Pink.     Mouth: Mucous membranes are moist.     Pharynx: Oropharynx is clear. Uvula midline.  Eyes:     General: Lids are normal.     Extraocular Movements: Extraocular movements intact.     Conjunctiva/sclera: Conjunctivae normal.     Pupils: Pupils are equal, round, and reactive to light.  Neck:     Thyroid: No thyroid mass, thyromegaly or thyroid tenderness.     Vascular: No carotid bruit or JVD.     Trachea: Trachea and phonation normal.  Cardiovascular:     Rate and Rhythm: Normal rate and regular rhythm.     Chest Wall: PMI is not displaced.     Pulses: Normal pulses.     Heart sounds: Normal heart sounds. No murmur. No friction rub. No gallop.   Pulmonary:     Effort: Pulmonary effort is normal. No respiratory distress.     Breath sounds: Normal breath sounds. No wheezing.  Abdominal:     General: Bowel sounds are normal. There is no distension or abdominal bruit.     Palpations: Abdomen is soft. There is no hepatomegaly or splenomegaly.     Tenderness: There is no abdominal tenderness. There is no right CVA tenderness or left CVA tenderness.     Hernia: No hernia is present.  Musculoskeletal:        General: Normal range of motion.     Cervical back: Normal range of motion and neck supple.     Right lower leg: No edema.     Left lower leg: No edema.  Lymphadenopathy:     Cervical: No cervical adenopathy.  Skin:    General: Skin is warm and dry.     Capillary Refill: Capillary refill takes less than 2 seconds.     Coloration: Skin is not cyanotic, jaundiced or pale.     Findings: No rash.  Neurological:     General: No focal deficit present.     Mental Status: He is alert and oriented to person, place, and time.     Cranial Nerves: Cranial nerves are intact. No cranial nerve deficit.     Sensory: Sensation is intact. No sensory deficit.     Motor: Motor function is intact. No weakness.     Coordination: Coordination is intact. Coordination normal.     Gait: Gait is  intact. Gait normal.     Deep Tendon Reflexes:  Reflexes are normal and symmetric. Reflexes normal.  Psychiatric:        Attention and Perception: Attention and perception normal.        Mood and Affect: Mood and affect normal.        Speech: Speech normal.        Behavior: Behavior normal. Behavior is cooperative.        Thought Content: Thought content normal.        Cognition and Memory: Cognition and memory normal.        Judgment: Judgment normal.     Results for orders placed or performed in visit on 01/26/19  CBC with Differential/Platelet  Result Value Ref Range   WBC 4.5 3.4 - 10.8 x10E3/uL   RBC 4.51 4.14 - 5.80 x10E6/uL   Hemoglobin 13.9 13.0 - 17.7 g/dL   Hematocrit 42.4 37.5 - 51.0 %   MCV 94 79 - 97 fL   MCH 30.8 26.6 - 33.0 pg   MCHC 32.8 31.5 - 35.7 g/dL   RDW 12.6 11.6 - 15.4 %   Platelets 295 150 - 450 x10E3/uL   Neutrophils 53 Not Estab. %   Lymphs 30 Not Estab. %   Monocytes 13 Not Estab. %   Eos 2 Not Estab. %   Basos 1 Not Estab. %   Neutrophils Absolute 2.4 1.4 - 7.0 x10E3/uL   Lymphocytes Absolute 1.4 0.7 - 3.1 x10E3/uL   Monocytes Absolute 0.6 0.1 - 0.9 x10E3/uL   EOS (ABSOLUTE) 0.1 0.0 - 0.4 x10E3/uL   Basophils Absolute 0.0 0.0 - 0.2 x10E3/uL   Immature Granulocytes 1 Not Estab. %   Immature Grans (Abs) 0.0 0.0 - 0.1 x10E3/uL  CMP14+EGFR  Result Value Ref Range   Glucose 99 65 - 99 mg/dL   BUN 22 8 - 27 mg/dL   Creatinine, Ser 1.12 0.76 - 1.27 mg/dL   GFR calc non Af Amer 66 >59 mL/min/1.73   GFR calc Af Amer 77 >59 mL/min/1.73   BUN/Creatinine Ratio 20 10 - 24   Sodium 140 134 - 144 mmol/L   Potassium 4.3 3.5 - 5.2 mmol/L   Chloride 103 96 - 106 mmol/L   CO2 23 20 - 29 mmol/L   Calcium 9.2 8.6 - 10.2 mg/dL   Total Protein 6.5 6.0 - 8.5 g/dL   Albumin 4.0 3.8 - 4.8 g/dL   Globulin, Total 2.5 1.5 - 4.5 g/dL   Albumin/Globulin Ratio 1.6 1.2 - 2.2   Bilirubin Total 0.6 0.0 - 1.2 mg/dL   Alkaline Phosphatase 55 39 - 117 IU/L   AST 25 0 -  40 IU/L   ALT 26 0 - 44 IU/L  Lipid panel  Result Value Ref Range   Cholesterol, Total 154 100 - 199 mg/dL   Triglycerides 62 0 - 149 mg/dL   HDL 59 >39 mg/dL   VLDL Cholesterol Cal 13 5 - 40 mg/dL   LDL Chol Calc (NIH) 82 0 - 99 mg/dL   Chol/HDL Ratio 2.6 0.0 - 5.0 ratio  PSA, total and free  Result Value Ref Range   Prostate Specific Ag, Serum 2.3 0.0 - 4.0 ng/mL   PSA, Free 0.64 N/A ng/mL   PSA, Free Pct 27.8 %  VITAMIN D 25 Hydroxy (Vit-D Deficiency, Fractures)  Result Value Ref Range   Vit D, 25-Hydroxy 60.4 30.0 - 100.0 ng/mL  Thyroid Panel With TSH  Result Value Ref Range   TSH 1.250 0.450 - 4.500 uIU/mL   T4, Total 9.1 4.5 -  12.0 ug/dL   T3 Uptake Ratio 33 24 - 39 %   Free Thyroxine Index 3.0 1.2 - 4.9  Bayer DCA Hb A1c Waived  Result Value Ref Range   HB A1C (BAYER DCA - WAIVED) 5.6 <7.0 %       Pertinent labs & imaging results that were available during my care of the patient were reviewed by me and considered in my medical decision making.  Assessment & Plan:  Clee was seen today for medical management of chronic issues.  Diagnoses and all orders for this visit:  Metabolic syndrome Doing well on below. Will continue. Diet and exercise encouraged. Labs pending.  -     pioglitazone (ACTOS) 30 MG tablet; Take 1 tablet (30 mg total) by mouth daily. -     CBC with Differential/Platelet -     CMP14+EGFR -     Lipid panel  Mixed hyperlipidemia Diet encouraged - increase intake of fresh fruits and vegetables, increase intake of lean proteins. Bake, broil, or grill foods. Avoid fried, greasy, and fatty foods. Avoid fast foods. Increase intake of fiber-rich whole grains. Exercise encouraged - at least 150 minutes per week and advance as tolerated.  Goal BMI < 25. Continue medications as prescribed. Follow up in 3-6 months as discussed.  -     atorvastatin (LIPITOR) 20 MG tablet; Take 1 tablet (20 mg total) by mouth daily.  Essential hypertension BP well  controlled. Changes were not made in regimen today. Goal BP is 130/80. Pt aware to report any persistent high or low readings. DASH diet and exercise encouraged. Exercise at least 150 minutes per week and increase as tolerated. Goal BMI > 25. Stress management encouraged. Avoid nicotine and tobacco product use. Avoid excessive alcohol and NSAID's. Avoid more than 2000 mg of sodium daily. Medications as prescribed. Follow up as scheduled.  -     benazepril (LOTENSIN) 20 MG tablet; Take 1 tablet (20 mg total) by mouth daily.  Postoperative hypothyroidism Thyroid disease has been well controlled. Labs are pending. Adjustments to regimen will be made if warranted. Make sure to take medications on an empty stomach with a full glass of water. Make sure to avoid vitamins or supplements for at least 4 hours before and 4 hours after taking medications. Repeat labs in 3 months if adjustments are made and in 6 months if stable.   -     levothyroxine (SYNTHROID) 75 MCG tablet; Take 1 tablet (75 mcg total) by mouth daily. -     Thyroid Panel With TSH  Vitamin D deficiency Labs pending. Continue repletion therapy. If indicated, will change repletion dosage. Eat foods rich in Vit D including milk, orange juice, yogurt with vitamin D added, salmon or mackerel, canned tuna fish, cereals with vitamin D added, and cod liver oil. Get out in the sun but make sure to wear at least SPF 30 sunscreen.  -     VITAMIN D 25 Hydroxy (Vit-D Deficiency, Fractures)  Seasonal allergic rhinitis due to pollen Will continue Flonase. Pt aware to report any new, worsening, or persistent symptoms.  -     fluticasone (FLONASE) 50 MCG/ACT nasal spray; Place 1 spray into both nostrils daily.  Screening for prostate cancer Scheduled to follow up with Dr. Jeffie Pollock . Labs pending.  -     PR PSA, TOTAL SCREENING  Pt aware he needs to schedule colonoscopy. Sees GI in [place], [place]   Continue all other maintenance medications.  Follow up  plan: Return in  about 6 months (around 01/20/2020), or if symptoms worsen or fail to improve, for chronic follow up.    Continue healthy lifestyle choices, including diet (rich in fruits, vegetables, and lean proteins, and low in salt and simple carbohydrates) and exercise (at least 30 minutes of moderate physical activity daily).  Educational handout given for health maintenance  The above assessment and management plan was discussed with the patient. The patient verbalized understanding of and has agreed to the management plan. Patient is aware to call the clinic if they develop any new symptoms or if symptoms persist or worsen. Patient is aware when to return to the clinic for a follow-up visit. Patient educated on when it is appropriate to go to the emergency department.   Monia Pouch, FNP-C [facility] [phone]

## 2019-07-22 LAB — CBC WITH DIFFERENTIAL/PLATELET
Basophils Absolute: 0 10*3/uL (ref 0.0–0.2)
Basos: 1 %
EOS (ABSOLUTE): 0.1 10*3/uL (ref 0.0–0.4)
Eos: 3 %
Hematocrit: 40.8 % (ref 37.5–51.0)
Hemoglobin: 13.5 g/dL (ref 13.0–17.7)
Immature Grans (Abs): 0 10*3/uL (ref 0.0–0.1)
Immature Granulocytes: 1 %
Lymphocytes Absolute: 1.2 10*3/uL (ref 0.7–3.1)
Lymphs: 29 %
MCH: 31.3 pg (ref 26.6–33.0)
MCHC: 33.1 g/dL (ref 31.5–35.7)
MCV: 95 fL (ref 79–97)
Monocytes Absolute: 0.5 10*3/uL (ref 0.1–0.9)
Monocytes: 13 %
Neutrophils Absolute: 2.3 10*3/uL (ref 1.4–7.0)
Neutrophils: 53 %
Platelets: 258 10*3/uL (ref 150–450)
RBC: 4.31 x10E6/uL (ref 4.14–5.80)
RDW: 12.6 % (ref 11.6–15.4)
WBC: 4.2 10*3/uL (ref 3.4–10.8)

## 2019-07-22 LAB — CMP14+EGFR
ALT: 22 IU/L (ref 0–44)
AST: 24 IU/L (ref 0–40)
Albumin/Globulin Ratio: 1.5 (ref 1.2–2.2)
Albumin: 3.8 g/dL (ref 3.8–4.8)
Alkaline Phosphatase: 51 IU/L (ref 39–117)
BUN/Creatinine Ratio: 23 (ref 10–24)
BUN: 30 mg/dL — ABNORMAL HIGH (ref 8–27)
Bilirubin Total: 0.6 mg/dL (ref 0.0–1.2)
CO2: 23 mmol/L (ref 20–29)
Calcium: 9.8 mg/dL (ref 8.6–10.2)
Chloride: 105 mmol/L (ref 96–106)
Creatinine, Ser: 1.29 mg/dL — ABNORMAL HIGH (ref 0.76–1.27)
GFR calc Af Amer: 65 mL/min/{1.73_m2} (ref >59)
GFR calc non Af Amer: 56 mL/min/{1.73_m2} — ABNORMAL LOW (ref >59)
Globulin, Total: 2.6 g/dL (ref 1.5–4.5)
Glucose: 94 mg/dL (ref 65–99)
Potassium: 4.5 mmol/L (ref 3.5–5.2)
Sodium: 140 mmol/L (ref 134–144)
Total Protein: 6.4 g/dL (ref 6.0–8.5)

## 2019-07-22 LAB — THYROID PANEL WITH TSH
Free Thyroxine Index: 2.6 (ref 1.2–4.9)
T3 Uptake Ratio: 33 % (ref 24–39)
T4, Total: 7.9 ug/dL (ref 4.5–12.0)
TSH: 1.5 u[IU]/mL (ref 0.450–4.500)

## 2019-07-22 LAB — LIPID PANEL
Chol/HDL Ratio: 2.5 ratio (ref 0.0–5.0)
Cholesterol, Total: 160 mg/dL (ref 100–199)
HDL: 64 mg/dL (ref >39)
LDL Chol Calc (NIH): 83 mg/dL (ref 0–99)
Triglycerides: 64 mg/dL (ref 0–149)
VLDL Cholesterol Cal: 13 mg/dL (ref 5–40)

## 2019-07-22 LAB — VITAMIN D 25 HYDROXY (VIT D DEFICIENCY, FRACTURES): Vit D, 25-Hydroxy: 57 ng/mL (ref 30.0–100.0)

## 2019-07-22 NOTE — Progress Notes (Signed)
CBC, cholesterol, Vit D, and thyroid function all normal. Glucose is normal. Liver function is normal. Renal function is declined. Make sure you are drinking plenty of water. You will need to recheck this in 1-2 weeks to make sure changes in medications do not need to be made.

## 2019-07-22 NOTE — Addendum Note (Signed)
Addended by: Karle Plumber on: 07/22/2019 09:24 AM   Modules accepted: Orders

## 2019-07-27 ENCOUNTER — Ambulatory Visit: Payer: Medicare HMO | Admitting: Family Medicine

## 2019-09-27 ENCOUNTER — Ambulatory Visit (INDEPENDENT_AMBULATORY_CARE_PROVIDER_SITE_OTHER): Payer: Medicare HMO

## 2019-09-27 ENCOUNTER — Encounter: Payer: Self-pay | Admitting: Family Medicine

## 2019-09-27 DIAGNOSIS — Z0000 Encounter for general adult medical examination without abnormal findings: Secondary | ICD-10-CM | POA: Diagnosis not present

## 2019-09-27 NOTE — Progress Notes (Signed)
MEDICARE ANNUAL WELLNESS VISIT  09/27/2019  Telephone Visit Disclaimer This Medicare AWV was conducted by telephone due to national recommendations for restrictions regarding the COVID-19 Pandemic (e.g. social distancing).  I verified, using two identifiers, that I am speaking with Shawn Ochoa or their authorized healthcare agent. I discussed the limitations, risks, security, and privacy concerns of performing an evaluation and management service by telephone and the potential availability of an in-person appointment in the future. The patient expressed understanding and agreed to proceed.   Subjective:  Shawn Ochoa is a 69 y.o. male patient of Loman Brooklyn, FNP who had a Medicare Annual Wellness Visit today via telephone. Piers is Working part time and lives with their spouse. he has three children. he reports that he is socially active and does interact with friends/family regularly. he is minimally physically active and enjoys being active in his church, with the [employer].  Patient Care Team: Loman Brooklyn, FNP as PCP - General (Family Medicine)  Advanced Directives 09/27/2019 09/22/2018  Does Patient Have a Medical Advance Directive? Yes Yes  Type of Paramedic of [place];Living will Living will;Healthcare Power of Attorney  Does patient want to make changes to medical advance directive? No - Patient declined No - Patient declined  Copy of [place] in Chart? - No - copy requested    Hospital Utilization Over the Past 12 Months: # of hospitalizations or ER visits: 0 # of surgeries: 0  Review of Systems    Patient reports that his overall health is unchanged compared to last year.    Patient Reported Readings (BP, Pulse, CBG, Weight, etc) none  Pain Assessment Pain : No/denies pain     Current Medications & Allergies (verified) Allergies as of 09/27/2019      Reactions   Mobic [meloxicam]  Other (See Comments)   Rectal bleeding with only 2 pills       Medication List       Accurate as of September 27, 2019 10:01 AM. If you have any questions, ask your nurse or doctor.        aspirin 81 MG EC tablet Take 81 mg by mouth daily.   atorvastatin 20 MG tablet Commonly known as: LIPITOR Take 1 tablet (20 mg total) by mouth daily.   benazepril 20 MG tablet Commonly known as: LOTENSIN Take 1 tablet (20 mg total) by mouth daily.   CALCIUM + D PO Take 1 capsule by mouth daily.   Fish Oil 1000 MG Caps Take 1 capsule by mouth 2 (two) times daily.   fluticasone 50 MCG/ACT nasal spray Commonly known as: FLONASE Place 1 spray into both nostrils daily.   levothyroxine 75 MCG tablet Commonly known as: SYNTHROID Take 1 tablet (75 mcg total) by mouth daily.   pioglitazone 30 MG tablet Commonly known as: ACTOS Take 1 tablet (30 mg total) by mouth daily.   Vitamin D3 125 MCG (5000 UT) Caps Take 1 capsule by mouth daily.       History (reviewed): Past Medical History:  Diagnosis Date  . Abnormal glucose   . Adenomatous polyp   . Cardiac dysrhythmia, unspecified   . Cataract   . Essential hypertension, benign   . Hemorrhage of gastrointestinal tract, unspecified   . Metabolic syndrome   . Nontoxic multinodular goiter   . Other and unspecified hyperlipidemia   . Prostatism    Past Surgical History:  Procedure Laterality Date  . CATARACT  EXTRACTION    . KNEE CARTILAGE SURGERY Right   . THYROIDECTOMY     Family History  Problem Relation Age of Onset  . Diabetes Mother   . Hypertension Mother   . Heart disease Mother   . Stroke Father   . Hypertension Father   . Cancer Maternal Uncle 19       colon  . Cancer Other 20       colon   Social History   Socioeconomic History  . Marital status: Married    Spouse name: Shawn Ochoa  . Number of children: 3  . Years of education: Not on file  . Highest education level: Professional school degree (e.g., MD, DDS, DVM,  JD)  Occupational History  . Not on file  Tobacco Use  . Smoking status: Never Smoker  . Smokeless tobacco: Never Used  Vaping Use  . Vaping Use: Never used  Substance and Sexual Activity  . Alcohol use: Yes    Comment: rare  . Drug use: No  . Sexual activity: Not Currently  Other Topics Concern  . Not on file  Social History Narrative  . Not on file   Social Determinants of Health   Financial Resource Strain:   . Difficulty of Paying Living Expenses:   Food Insecurity:   . Worried About Charity fundraiser in the Last Year:   . Arboriculturist in the Last Year:   Transportation Needs:   . Film/video editor (Medical):   Marland Kitchen Lack of Transportation (Non-Medical):   Physical Activity:   . Days of Exercise per Week:   . Minutes of Exercise per Session:   Stress:   . Feeling of Stress :   Social Connections:   . Frequency of Communication with Friends and Family:   . Frequency of Social Gatherings with Friends and Family:   . Attends Religious Services:   . Active Member of Clubs or Organizations:   . Attends Archivist Meetings:   Marland Kitchen Marital Status:     Activities of Daily Living In your present state of health, do you have any difficulty performing the following activities: 09/27/2019  Hearing? N  Vision? N  Difficulty concentrating or making decisions? N  Walking or climbing stairs? N  Dressing or bathing? N  Doing errands, shopping? N  Preparing Food and eating ? N  Using the Toilet? N  In the past six months, have you accidently leaked urine? N  Do you have problems with loss of bowel control? N  Managing your Medications? N  Managing your Finances? N  Some recent data might be hidden    Patient Education/ Literacy How often do you need to have someone help you when you read instructions, pamphlets, or other written materials from your doctor or pharmacy?: 1 - Never What is the last grade level you completed in school?:  College  Exercise Current Exercise Habits: Home exercise routine, Type of exercise: walking, Frequency (Times/Week): >7, Intensity: Mild  Diet Patient reports consuming 2 meals a day and 3 snack(s) a day Patient reports that his primary diet is: Regular Patient reports that she does have regular access to food.   Depression Screen PHQ 2/9 Scores 09/27/2019 01/26/2019 09/22/2018 01/27/2018 07/17/2017 01/06/2017 06/18/2016  PHQ - 2 Score 0 0 0 0 0 0 0     Fall Risk Fall Risk  09/27/2019 01/26/2019 09/22/2018 01/27/2018 07/17/2017  Falls in the past year? 0 0 0 No No  Number falls  in past yr: - - 0 - -  Injury with Fall? - - 0 - -     Objective:  Shawn Ochoa seemed alert and oriented and he participated appropriately during our telephone visit.  Blood Pressure Weight BMI  BP Readings from Last 3 Encounters:  07/21/19 114/71  01/26/19 120/64  01/27/18 112/67   Wt Readings from Last 3 Encounters:  07/21/19 218 lb 12.8 oz (99.2 kg)  01/26/19 220 lb (99.8 kg)  09/22/18 224 lb (101.6 kg)   BMI Readings from Last 1 Encounters:  07/21/19 31.39 kg/m    *Unable to obtain current vital signs, weight, and BMI due to telephone visit type  Hearing/Vision  . Koen did not seem to have difficulty with hearing/understanding during the telephone conversation . Reports that he has not had a formal eye exam by an eye care professional within the past year . Reports that he has not had a formal hearing evaluation within the past year *Unable to fully assess hearing and vision during telephone visit type  Cognitive Function: 6CIT Screen 09/27/2019 09/22/2018  What Year? 0 points 0 points  What month? 0 points 0 points  What time? 0 points 0 points  Count back from 20 0 points 0 points  Months in reverse 0 points 0 points  Repeat phrase - 0 points  Total Score - 0   (Normal:0-7, Significant for Dysfunction: >8)  Normal Cognitive Function Screening: Yes   Immunization & Health  Maintenance Record Immunization History  Administered Date(s) Administered  . Fluad Quad(high Dose 65+) 01/26/2019  . Influenza Split 01/15/2016  . Influenza Whole 01/12/2009  . Influenza, High Dose Seasonal PF 01/06/2017, 01/27/2018  . Influenza-Unspecified 01/12/2014, 02/22/2015  . Pneumococcal Conjugate-13 05/12/2014  . Pneumococcal Polysaccharide-23 06/12/1996, 06/18/2016  . Td 02/12/2005, 01/27/2018  . Tdap 01/10/2010  . Zoster 07/26/2009    Health Maintenance  Topic Date Due  . COVID-19 Vaccine (1) Never done  . COLONOSCOPY  01/26/2019  . INFLUENZA VACCINE  11/13/2019  . TETANUS/TDAP  01/28/2028  . Hepatitis C Screening  Completed  . PNA vac Low Risk Adult  Completed  . COLON CANCER SCREENING ANNUAL FOBT  Discontinued       Assessment  This is a routine wellness examination for Aimee Timmons.  Health Maintenance: Due or Overdue Health Maintenance Due  Topic Date Due  . COVID-19 Vaccine (1) Never done  . COLONOSCOPY  01/26/2019    Shawn Ochoa does not need a referral for Community Assistance: Care Management:   no Social Work:    no Prescription Assistance:  no Nutrition/Diabetes Education:  no   Plan:  Personalized Goals  Have three balanced meals per day  Monitor BMI   Personalized Health Maintenance & Screening Recommendations   Patient will call and schedule his follow up appointment with his gastroenterologist and urologist.  Lung Cancer Screening Recommended: no (Low Dose CT Chest recommended if Age 89-80 years, 30 pack-year currently smoking OR have quit w/in past 15 years) Hepatitis C Screening recommended: no HIV Screening recommended: no  Advanced Directives: Written information was not prepared per patient's request.  Referrals & Orders No orders of the defined types were placed in this encounter.   Follow-up Plan . Follow-up with Loman Brooklyn, FNP as planned . Schedule 01/20/2020   I have personally reviewed  and noted the following in the patient's chart:   . Medical and social history . Use of alcohol, tobacco or illicit drugs  . Current  medications and supplements . Functional ability and status . Nutritional status . Physical activity . Advanced directives . List of other physicians . Hospitalizations, surgeries, and ER visits in previous 12 months . Vitals . Screenings to include cognitive, depression, and falls . Referrals and appointments  In addition, I have reviewed and discussed with Shawn Ochoa certain preventive protocols, quality metrics, and best practice recommendations. A written personalized care plan for preventive services as well as general preventive health recommendations is available and can be mailed to the patient at his request.      Maud Deed [facility]  2/39/5320

## 2019-09-27 NOTE — Patient Instructions (Addendum)
  [place] Maintenance Summary and Written Plan of Care  [name] ,  Thank you for allowing me to perform your Medicare Annual Wellness Visit and for your ongoing commitment to your health.   Health Maintenance & Immunization History Health Maintenance  Topic Date Due  . COVID-19 Vaccine (1) Never done  . COLONOSCOPY  01/26/2019  . INFLUENZA VACCINE  11/13/2019  . TETANUS/TDAP  01/28/2028  . Hepatitis C Screening  Completed  . PNA vac Low Risk Adult  Completed  . COLON CANCER SCREENING ANNUAL FOBT  Discontinued   Immunization History  Administered Date(s) Administered  . Fluad Quad(high Dose 65+) 01/26/2019  . Influenza Split 01/15/2016  . Influenza Whole 01/12/2009  . Influenza, High Dose Seasonal PF 01/06/2017, 01/27/2018  . Influenza-Unspecified 01/12/2014, 02/22/2015  . Pneumococcal Conjugate-13 05/12/2014  . Pneumococcal Polysaccharide-23 06/12/1996, 06/18/2016  . Td 02/12/2005, 01/27/2018  . Tdap 01/10/2010  . Zoster 07/26/2009    These are the patient goals that we discussed: Goals Addressed   None      This is a list of Health Maintenance Items that are overdue or due now: Health Maintenance Due  Topic Date Due  . COVID-19 Vaccine (1) Never done  . COLONOSCOPY  01/26/2019     Orders/Referrals Placed Today: No orders of the defined types were placed in this encounter.  (Contact our referral department at [phone] if you have not spoken with someone about your referral appointment within the next 5 days)    Follow-up Plan  Please scheduled your follow up appointments with your Gastroenterologist for your colonoscopy and your Urologist, Dr. Jeffie Pollock.  Scheduled with Shawn Limes, FNP 01/20/2020 at 10:05am.

## 2019-09-28 ENCOUNTER — Encounter: Payer: Self-pay | Admitting: Family Medicine

## 2019-11-03 NOTE — Progress Notes (Signed)
Subjective:  1. Elevated PSA   2. BPH with urinary obstruction   3. Weak urinary stream   4. Nocturia   5. Personal history of urinary infection      CC: Elevated PSA.    HPI: Shawn Ochoa is a 69 year-old male established patient who is here for an elevated PSA.  His most recent PSA was 2.3 on 01/26/19.     Shawn Ochoa returns today in f/u for his history of an elevated PSA that was up to 11.1 in early October 2018.  His PSA has returned to baseline and was 2.3 in 10/20.  He was felt to have had prostatitis responsible for the elevation. I last saw him in 2012 at which time he had a history of an elevated PSA with prostatitits in 2008.Marland Kitchen His PSA prior to his 2012 visit was down to 1.6. His PSA had been as high as 12. He has had 2 negative biopsies with the last in 2009 and a negative PCA3. He has had no dysuria or hematuria. His IPSS is 10 which is is stable.  He has nocturia x 3 and a reduced stream.  He has occasional urgency.   His UA is unremarkable today.  He has no other associated signs or symptoms.       IPSS    Row Name 11/04/19 1100         International Prostate Symptom Score   How often have you had the sensation of not emptying your bladder? Not at All     How often have you had to urinate less than every two hours? Less than 1 in 5 times     How often have you found you stopped and started again several times when you urinated? Less than 1 in 5 times     How often have you found it difficult to postpone urination? Less than half the time     How often have you had a weak urinary stream? About half the time     How often have you had to strain to start urination? Not at All     How many times did you typically get up at night to urinate? 3 Times     Total IPSS Score 10       Quality of Life due to urinary symptoms   If you were to spend the rest of your life with your urinary condition just the way it is now how would you feel about that? Mostly Satisfied              ROS:  ROS:  A complete review of systems was performed.  All systems are negative except for pertinent findings as noted.   ROS  Allergies  Allergen Reactions  . Mobic [Meloxicam] Other (See Comments)    Rectal bleeding with only 2 pills     Outpatient Encounter Medications as of 11/04/2019  Medication Sig  . aspirin 81 MG EC tablet Take 81 mg by mouth daily.    Marland Kitchen atorvastatin (LIPITOR) 20 MG tablet Take 1 tablet (20 mg total) by mouth daily.  . benazepril (LOTENSIN) 20 MG tablet Take 1 tablet (20 mg total) by mouth daily.  . Calcium Carbonate-Vitamin D (CALCIUM + D PO) Take 1 capsule by mouth daily.    . Cholecalciferol (VITAMIN D3) 5000 UNITS CAPS Take 1 capsule by mouth daily.    . fluticasone (FLONASE) 50 MCG/ACT nasal spray Place 1 spray into both nostrils daily.  Marland Kitchen levothyroxine (  SYNTHROID) 75 MCG tablet Take 1 tablet (75 mcg total) by mouth daily.  . Omega-3 Fatty Acids (FISH OIL) 1000 MG CAPS Take 1 capsule by mouth 2 (two) times daily.    . pioglitazone (ACTOS) 30 MG tablet Take 1 tablet (30 mg total) by mouth daily.   No facility-administered encounter medications on file as of 11/04/2019.    Past Medical History:  Diagnosis Date  . Abnormal glucose   . Adenomatous polyp   . Cardiac dysrhythmia, unspecified   . Cataract   . Essential hypertension, benign   . Hemorrhage of gastrointestinal tract, unspecified   . Metabolic syndrome   . Nontoxic multinodular goiter   . Other and unspecified hyperlipidemia   . Prostatism     Past Surgical History:  Procedure Laterality Date  . CATARACT EXTRACTION    . KNEE CARTILAGE SURGERY Right   . THYROIDECTOMY      Social History   Socioeconomic History  . Marital status: Married    Spouse name: Shawn Ochoa  . Number of children: 3  . Years of education: Not on file  . Highest education level: Professional school degree (e.g., MD, DDS, DVM, JD)  Occupational History  . Not on file  Tobacco Use  . Smoking status:  Never Smoker  . Smokeless tobacco: Never Used  Vaping Use  . Vaping Use: Never used  Substance and Sexual Activity  . Alcohol use: Yes    Comment: rare  . Drug use: No  . Sexual activity: Not Currently  Other Topics Concern  . Not on file  Social History Narrative  . Not on file   Social Determinants of Health   Financial Resource Strain:   . Difficulty of Paying Living Expenses:   Food Insecurity:   . Worried About Charity fundraiser in the Last Year:   . Arboriculturist in the Last Year:   Transportation Needs:   . Film/video editor (Medical):   Marland Kitchen Lack of Transportation (Non-Medical):   Physical Activity:   . Days of Exercise per Week:   . Minutes of Exercise per Session:   Stress:   . Feeling of Stress :   Social Connections:   . Frequency of Communication with Friends and Family:   . Frequency of Social Gatherings with Friends and Family:   . Attends Religious Services:   . Active Member of Clubs or Organizations:   . Attends Archivist Meetings:   Marland Kitchen Marital Status:   Intimate Partner Violence:   . Fear of Current or Ex-Partner:   . Emotionally Abused:   Marland Kitchen Physically Abused:   . Sexually Abused:     Family History  Problem Relation Age of Onset  . Diabetes Mother   . Hypertension Mother   . Heart disease Mother   . Stroke Father   . Hypertension Father   . Cancer Maternal Uncle 19       colon  . Cancer Other 20       colon       Objective: Vitals:   11/04/19 1108  BP: 122/69  Pulse: 71  Temp: 98.1 F (36.7 C)     Physical Exam Vitals reviewed.  Constitutional:      Appearance: Normal appearance.  Genitourinary:    Comments: AP normal. NST without mass. Prostate 3+ benign. SV's non-palpable.  Neurological:     Mental Status: He is alert.     Lab Results:  Results for orders placed or performed in  visit on 11/04/19 (from the past 24 hour(s))  POCT urinalysis dipstick     Status: Abnormal   Collection Time:  11/04/19 11:04 AM  Result Value Ref Range   Color, UA yellow    Clarity, UA clear    Glucose, UA Negative Negative   Bilirubin, UA neg    Ketones, UA neg    Spec Grav, UA 1.025 1.010 - 1.025   Blood, UA neg    pH, UA 6.5 5.0 - 8.0   Protein, UA Negative Negative   Urobilinogen, UA negative (A) 0.2 or 1.0 E.U./dL   Nitrite, UA neg    Leukocytes, UA Trace (A) Negative   Appearance     Odor      BMET No results for input(s): NA, K, CL, CO2, GLUCOSE, BUN, CREATININE, CALCIUM in the last 72 hours. PSA  PSA was 2.3 in 10/21  PSA  Date Value Ref Range Status  11/07/2013 2.2 0.0 - 4.0 ng/mL Final    Comment:    Roche ECLIA methodology. According to the American Urological Association, Serum PSA should decrease and remain at undetectable levels after radical prostatectomy. The AUA defines biochemical recurrence as an initial PSA value 0.2 ng/mL or greater followed by a subsequent confirmatory PSA value 0.2 ng/mL or greater. Values obtained with different assay methods or kits cannot be used interchangeably. Results cannot be interpreted as absolute evidence of the presence or absence of malignant disease.   No results found for: TESTOSTERONE    Studies/Results: No results found.    Assessment & Plan: Elevated PSA His PSA was normal and at baseline in 10/20.   His exam is benign.   He will f/u in 1 year and I will review the PSA from his PCP which should be done this fall. Marland Kitchen   BPH with urinary obstruction He has stable moderate LUTS with nocturia, a reduced stream and urgency.  He doesn't feel he needs treatment.     No orders of the defined types were placed in this encounter.    Orders Placed This Encounter  Procedures  . POCT urinalysis dipstick      Return in about 1 year (around 11/03/2020).   CC: Loman Brooklyn, FNP      Irine Seal 11/04/2019

## 2019-11-04 ENCOUNTER — Ambulatory Visit (INDEPENDENT_AMBULATORY_CARE_PROVIDER_SITE_OTHER): Payer: Medicare HMO | Admitting: Urology

## 2019-11-04 ENCOUNTER — Encounter: Payer: Self-pay | Admitting: Urology

## 2019-11-04 ENCOUNTER — Other Ambulatory Visit: Payer: Self-pay

## 2019-11-04 VITALS — BP 122/69 | HR 71 | Temp 98.1°F | Ht 70.0 in | Wt 214.0 lb

## 2019-11-04 DIAGNOSIS — R351 Nocturia: Secondary | ICD-10-CM | POA: Diagnosis not present

## 2019-11-04 DIAGNOSIS — R3912 Poor urinary stream: Secondary | ICD-10-CM | POA: Diagnosis not present

## 2019-11-04 DIAGNOSIS — R972 Elevated prostate specific antigen [PSA]: Secondary | ICD-10-CM | POA: Insufficient documentation

## 2019-11-04 DIAGNOSIS — N138 Other obstructive and reflux uropathy: Secondary | ICD-10-CM

## 2019-11-04 DIAGNOSIS — N401 Enlarged prostate with lower urinary tract symptoms: Secondary | ICD-10-CM | POA: Diagnosis not present

## 2019-11-04 DIAGNOSIS — Z87440 Personal history of urinary (tract) infections: Secondary | ICD-10-CM

## 2019-11-04 LAB — POCT URINALYSIS DIPSTICK
Bilirubin, UA: NEGATIVE
Blood, UA: NEGATIVE
Glucose, UA: NEGATIVE
Ketones, UA: NEGATIVE
Nitrite, UA: NEGATIVE
Protein, UA: NEGATIVE
Spec Grav, UA: 1.025 (ref 1.010–1.025)
Urobilinogen, UA: NEGATIVE E.U./dL — AB
pH, UA: 6.5 (ref 5.0–8.0)

## 2019-11-04 NOTE — Assessment & Plan Note (Signed)
His PSA was normal and at baseline in 10/20.   His exam is benign.   He will f/u in 1 year and I will review the PSA from his PCP which should be done this fall. Marland Kitchen

## 2019-11-04 NOTE — Assessment & Plan Note (Signed)
He has stable moderate LUTS with nocturia, a reduced stream and urgency.  He doesn't feel he needs treatment.

## 2019-11-04 NOTE — Progress Notes (Signed)
Urological Symptom Review  Patient is experiencing the following symptoms: Frequent urination Hard to postpone urination Get up at night to urinate Weak stream   Review of Systems  Gastrointestinal (upper)  : Negative for upper GI symptoms  Gastrointestinal (lower) : Negative for lower GI symptoms  Constitutional : Negative for symptoms  Skin: Negative for skin symptoms  Eyes: Negative for eye symptoms  Ear/Nose/Throat : Negative for Ear/Nose/Throat symptoms  Hematologic/Lymphatic: Negative for Hematologic/Lymphatic symptoms  Cardiovascular : Negative for cardiovascular symptoms  Respiratory : Negative for respiratory symptoms  Endocrine: Negative for endocrine symptoms  Musculoskeletal: Back pain   Neurological: Negative for neurological symptoms  Psychologic: Negative for psychiatric symptoms

## 2019-11-29 IMAGING — DX DG HIP (WITH OR WITHOUT PELVIS) 2-3V*L*
3 series · 3 of 3 positions shown · non-contrast
Comparison: None.

CLINICAL DATA: Left hip pain, no injury

EXAM:
DG HIP (WITH OR WITHOUT PELVIS) 2-3V LEFT

[pelvis ap (1 of 2)]
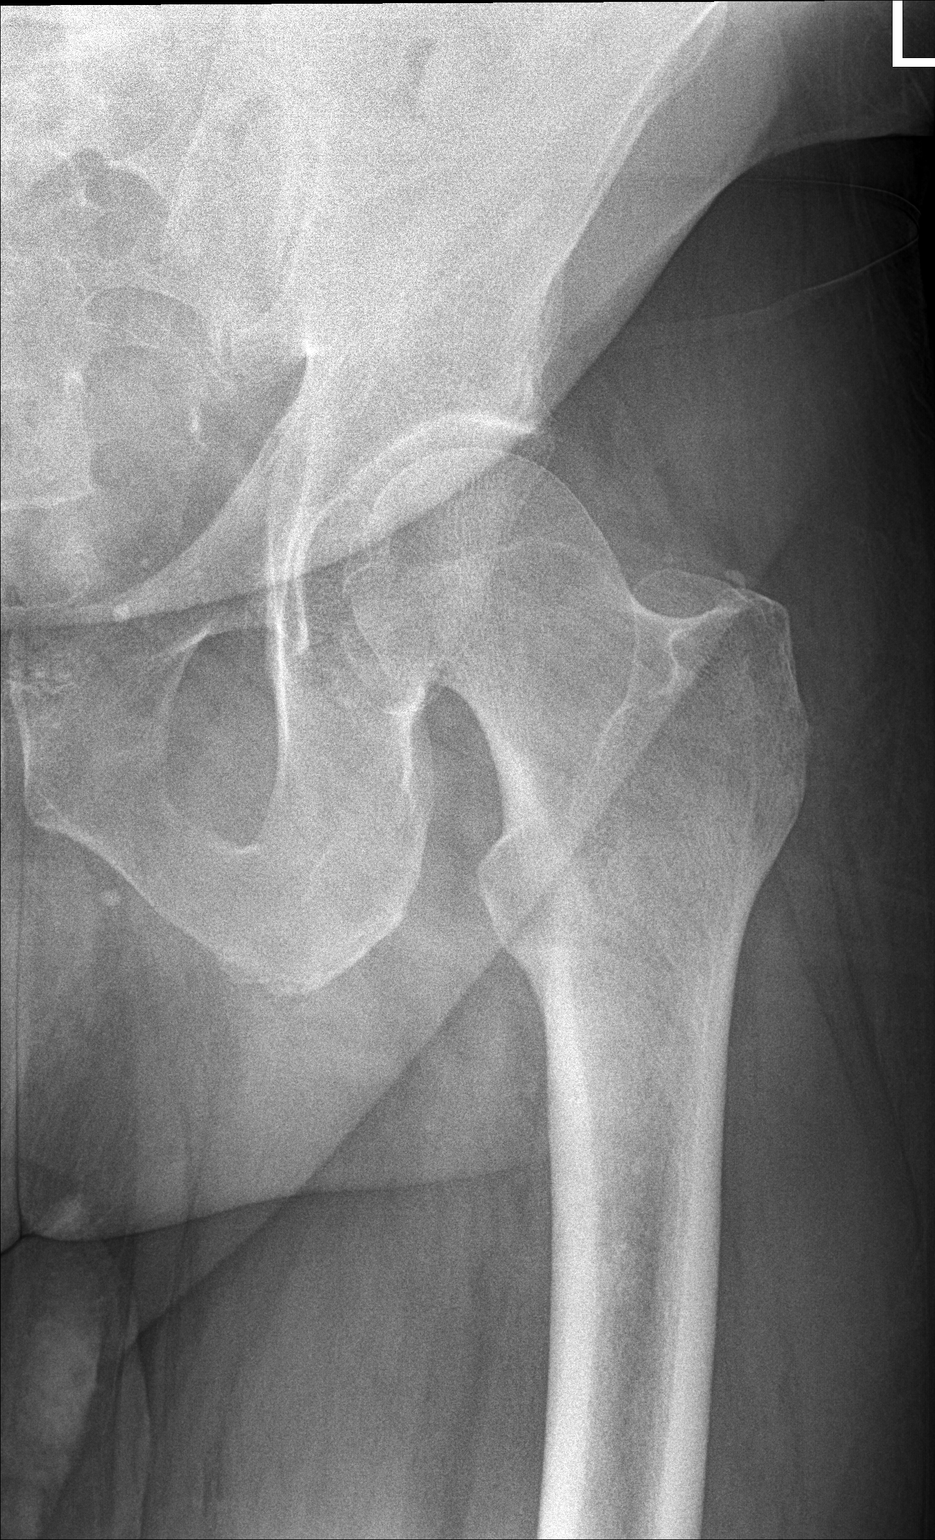

[hip lat]
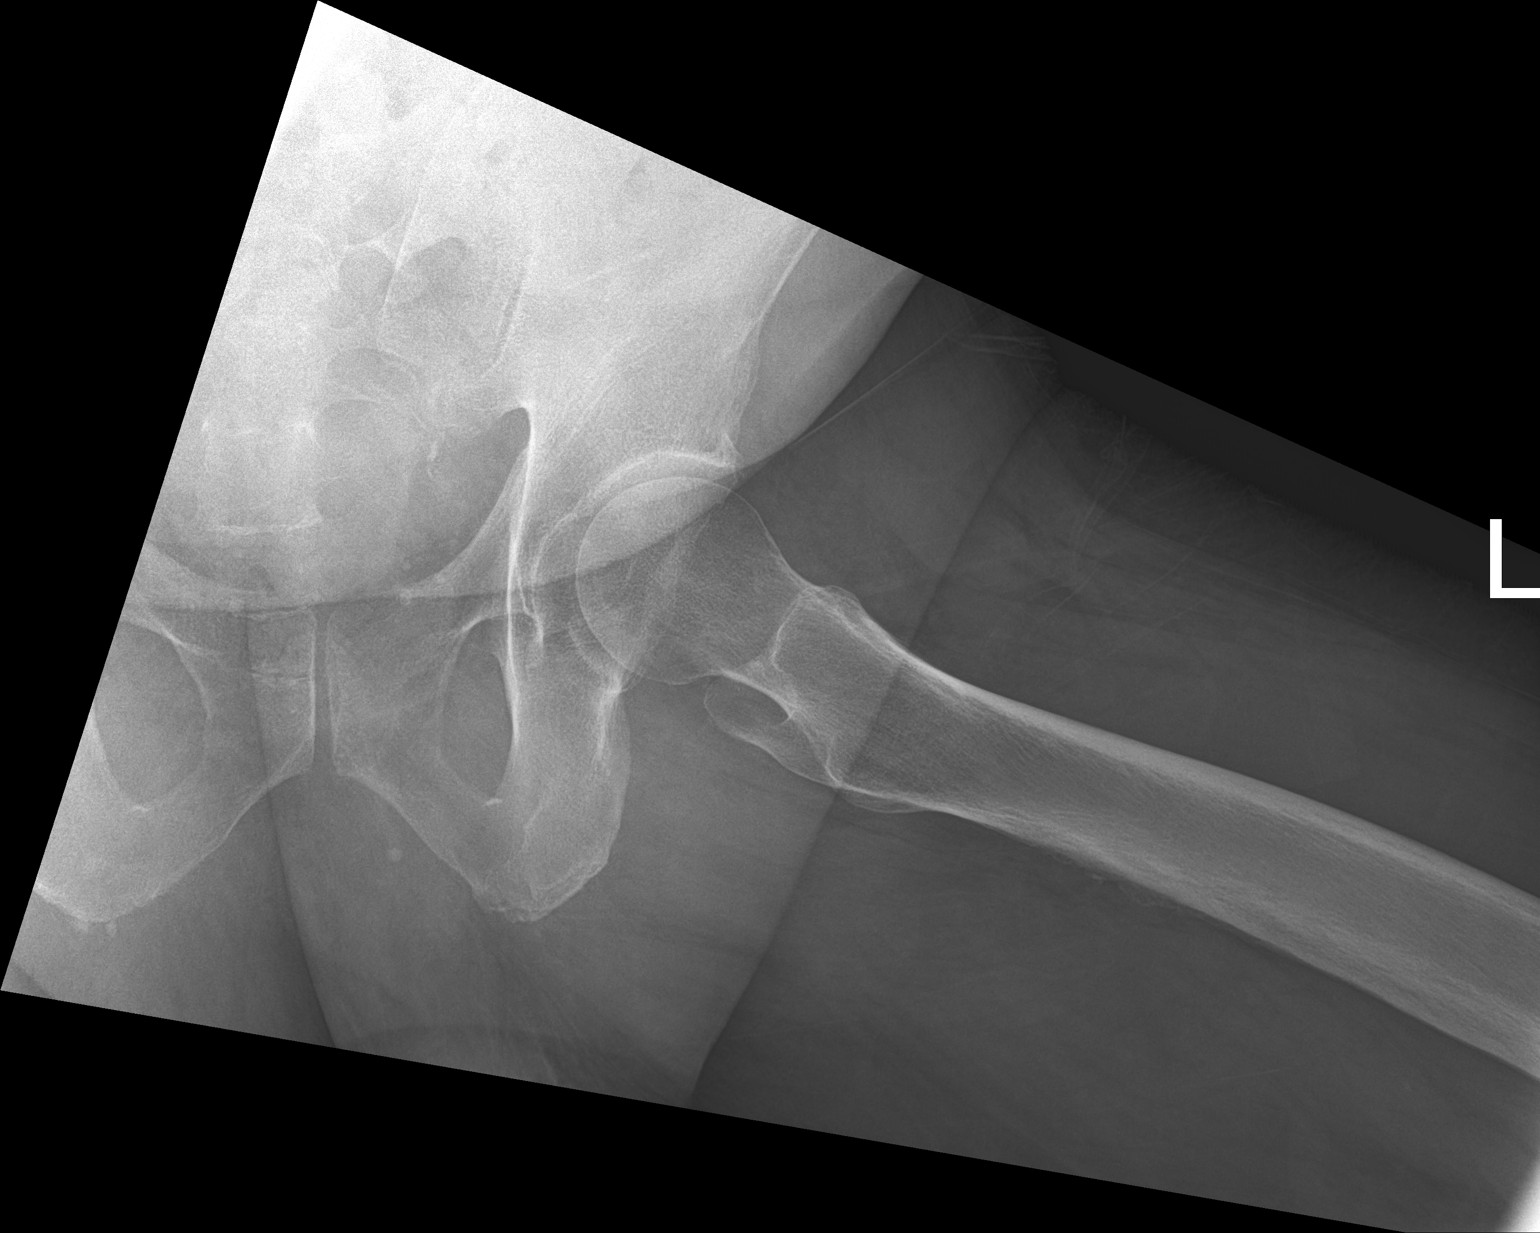

[pelvis ap (2 of 2)]
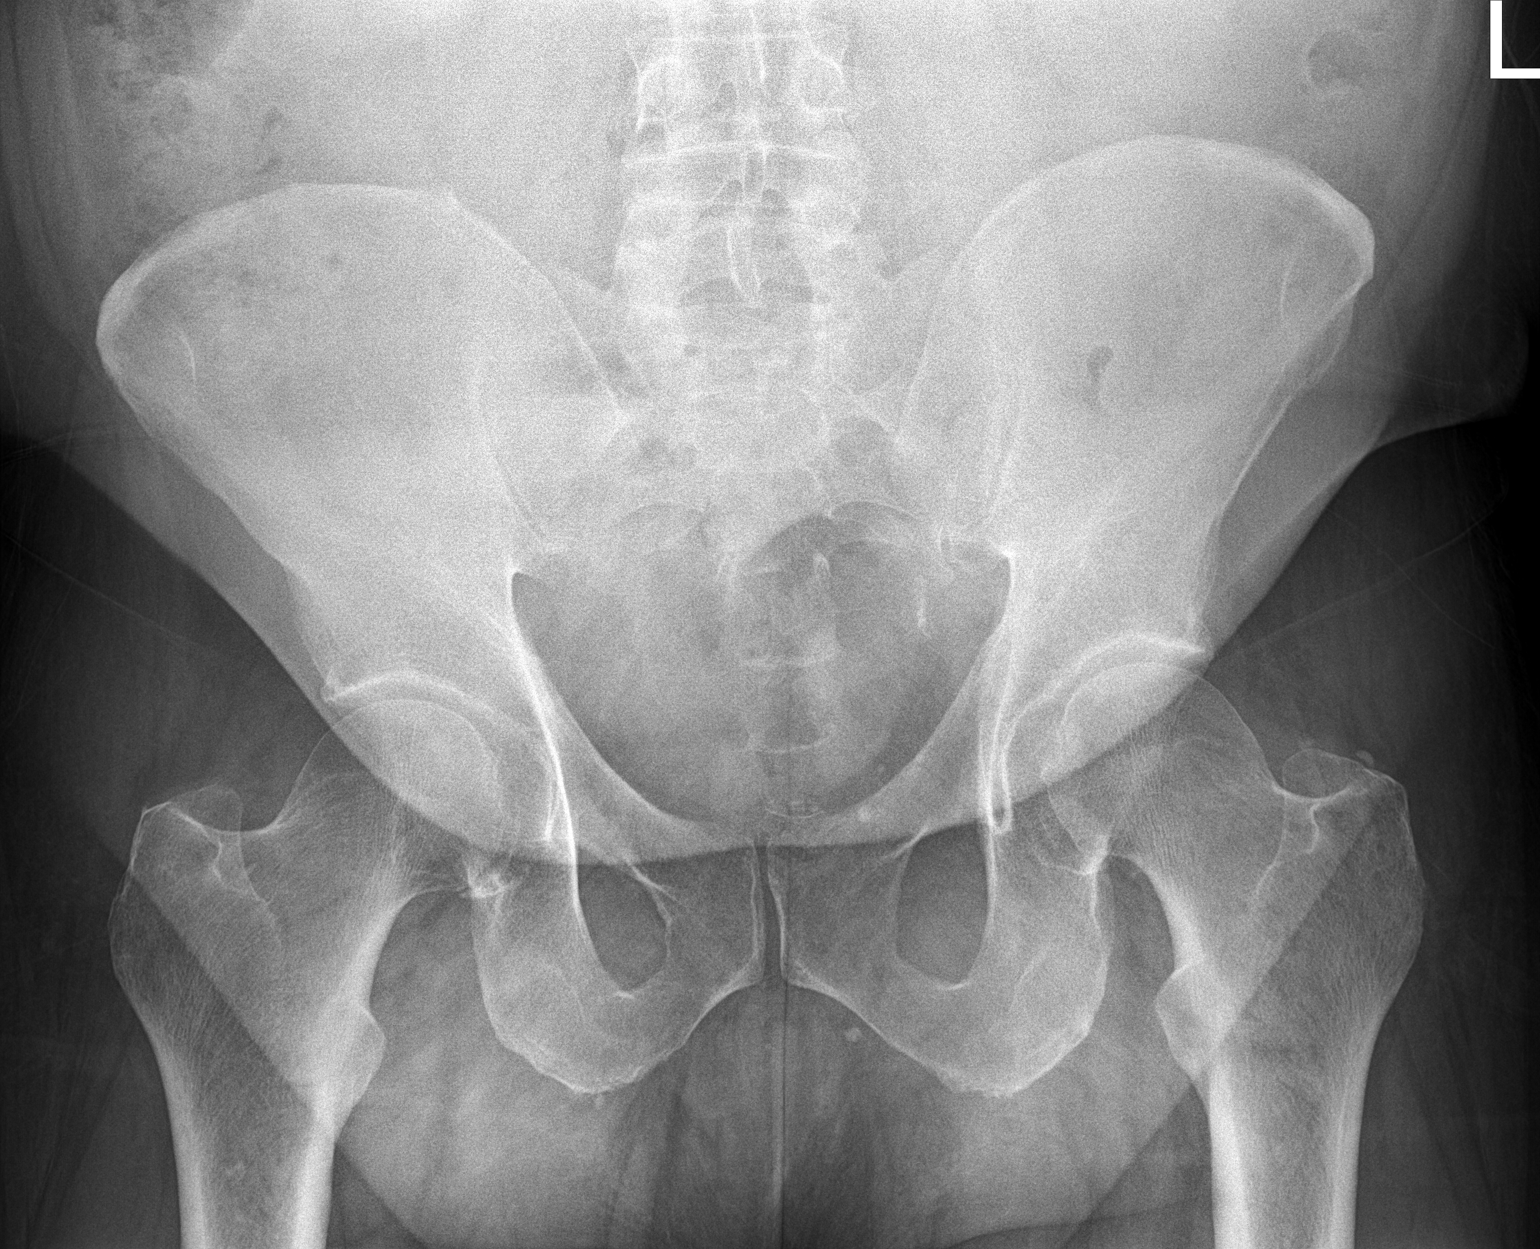

[3 of 3 positions shown; findings below may reference images not displayed]

FINDINGS: Both hips appear to be in normal position with very little
degenerative change for age. No fracture is seen. The pelvic rami
are intact. The SI joints appear well corticated.
IMPRESSION: No acute abnormality.  Very little degenerative change for age.

## 2020-01-02 LAB — HM COLONOSCOPY

## 2020-01-20 ENCOUNTER — Other Ambulatory Visit: Payer: Self-pay

## 2020-01-20 ENCOUNTER — Encounter: Payer: Self-pay | Admitting: Family Medicine

## 2020-01-20 ENCOUNTER — Ambulatory Visit: Payer: Medicare HMO | Admitting: Family Medicine

## 2020-01-20 VITALS — BP 125/76 | HR 56 | Temp 97.6°F | Ht 70.0 in | Wt 220.6 lb

## 2020-01-20 DIAGNOSIS — I10 Essential (primary) hypertension: Secondary | ICD-10-CM

## 2020-01-20 DIAGNOSIS — E890 Postprocedural hypothyroidism: Secondary | ICD-10-CM

## 2020-01-20 DIAGNOSIS — J301 Allergic rhinitis due to pollen: Secondary | ICD-10-CM

## 2020-01-20 DIAGNOSIS — E782 Mixed hyperlipidemia: Secondary | ICD-10-CM

## 2020-01-20 DIAGNOSIS — Z23 Encounter for immunization: Secondary | ICD-10-CM | POA: Diagnosis not present

## 2020-01-20 DIAGNOSIS — N401 Enlarged prostate with lower urinary tract symptoms: Secondary | ICD-10-CM | POA: Diagnosis not present

## 2020-01-20 DIAGNOSIS — E8881 Metabolic syndrome: Secondary | ICD-10-CM | POA: Diagnosis not present

## 2020-01-20 DIAGNOSIS — N138 Other obstructive and reflux uropathy: Secondary | ICD-10-CM

## 2020-01-20 LAB — BAYER DCA HB A1C WAIVED: HB A1C (BAYER DCA - WAIVED): 5.4 % (ref <7.0)

## 2020-01-20 NOTE — Progress Notes (Signed)
Assessment & Plan:  1. Primary hypertension - Well controlled on current regimen.  - CMP14+EGFR - Lipid panel  2. Mixed hyperlipidemia - Well controlled on current regimen.  - CMP14+EGFR - Lipid panel  3. Metabolic syndrome - Well controlled on current regimen.  - CMP14+EGFR - Lipid panel - Bayer DCA Hb A1c Waived  4. BPH with urinary obstruction - Well controlled on current regimen. Managed by urology. - CMP14+EGFR - PSA, total and free  5. Postoperative hypothyroidism - Well controlled on current regimen.  - CMP14+EGFR - TSH  6. Seasonal allergic rhinitis due to pollen - Well controlled on current regimen.   7. Need for immunization against influenza - Flu Vaccine QUAD High Dose(Fluad)   Return in about 6 months (around 07/20/2020) for annual physical.  Hendricks Limes, MSN, APRN, FNP-C [facility]  Subjective:    Patient ID: Shawn Ochoa, male    DOB: 1950-04-27, 69 y.o.   MRN: [id number]  Patient Care Team: Loman Brooklyn, FNP as PCP - General (Family Medicine)   Chief Complaint:  Chief Complaint  Patient presents with  . Hyperlipidemia    6 month check up of chronic medical conditions  . Hypertension  . Establish Care    [facility] patient     HPI: Shawn Ochoa is a 69 y.o. male presenting on 01/20/2020 for Hyperlipidemia (6 month check up of chronic medical conditions), Hypertension, and Establish Care ([facility] patient )  Patient here for routine follow-up. He has no complaints/concerns.   Patient reports he takes pioglitazone because of his prediabetes and has been for years.   His urologist has requested a PSA be drawn with his lab work this fall.   New complaints: None  Social history:  Relevant past medical, surgical, family and social history reviewed and updated as indicated. Interim medical history since our last visit reviewed.  Allergies and medications reviewed and updated.  DATA REVIEWED: CHART  IN EPIC  ROS: Negative unless specifically indicated above in HPI.    Current Outpatient Medications:  .  aspirin 81 MG EC tablet, Take 81 mg by mouth daily.  , Disp: , Rfl:  .  atorvastatin (LIPITOR) 20 MG tablet, Take 1 tablet (20 mg total) by mouth daily., Disp: 90 tablet, Rfl: 3 .  benazepril (LOTENSIN) 20 MG tablet, Take 1 tablet (20 mg total) by mouth daily., Disp: 90 tablet, Rfl: 3 .  Calcium Carbonate-Vitamin D (CALCIUM + D PO), Take 1 capsule by mouth daily.  , Disp: , Rfl:  .  Cholecalciferol (VITAMIN D3) 5000 UNITS CAPS, Take 1 capsule by mouth daily.  , Disp: , Rfl:  .  fluticasone (FLONASE) 50 MCG/ACT nasal spray, Place 1 spray into both nostrils daily., Disp: 48 mL, Rfl: 3 .  levothyroxine (SYNTHROID) 75 MCG tablet, Take 1 tablet (75 mcg total) by mouth daily., Disp: 90 tablet, Rfl: 3 .  Omega-3 Fatty Acids (FISH OIL) 1000 MG CAPS, Take 1 capsule by mouth 2 (two) times daily.  , Disp: , Rfl:  .  pioglitazone (ACTOS) 30 MG tablet, Take 1 tablet (30 mg total) by mouth daily., Disp: 90 tablet, Rfl: 3   Allergies  Allergen Reactions  . Mobic [Meloxicam] Other (See Comments)    Rectal bleeding with only 2 pills    Past Medical History:  Diagnosis Date  . Abnormal glucose   . Adenomatous polyp   . Cardiac dysrhythmia, unspecified   . Cataract   . Essential hypertension, benign   .  Hemorrhage of gastrointestinal tract, unspecified   . Metabolic syndrome   . Nontoxic multinodular goiter   . Other and unspecified hyperlipidemia   . Prostatism     Past Surgical History:  Procedure Laterality Date  . CATARACT EXTRACTION    . KNEE CARTILAGE SURGERY Right   . THYROIDECTOMY      Social History   Socioeconomic History  . Marital status: Married    Spouse name: Hilda Blades  . Number of children: 3  . Years of education: Not on file  . Highest education level: Professional school degree (e.g., MD, DDS, DVM, JD)  Occupational History  . Not on file  Tobacco Use  . Smoking  status: Never Smoker  . Smokeless tobacco: Never Used  Vaping Use  . Vaping Use: Never used  Substance and Sexual Activity  . Alcohol use: Yes    Comment: rare  . Drug use: No  . Sexual activity: Not Currently  Other Topics Concern  . Not on file  Social History Narrative  . Not on file   Social Determinants of Health   Financial Resource Strain:   . Difficulty of Paying Living Expenses: Not on file  Food Insecurity:   . Worried About Charity fundraiser in the Last Year: Not on file  . Ran Out of Food in the Last Year: Not on file  Transportation Needs:   . Lack of Transportation (Medical): Not on file  . Lack of Transportation (Non-Medical): Not on file  Physical Activity:   . Days of Exercise per Week: Not on file  . Minutes of Exercise per Session: Not on file  Stress:   . Feeling of Stress : Not on file  Social Connections:   . Frequency of Communication with Friends and Family: Not on file  . Frequency of Social Gatherings with Friends and Family: Not on file  . Attends Religious Services: Not on file  . Active Member of Clubs or Organizations: Not on file  . Attends Archivist Meetings: Not on file  . Marital Status: Not on file  Intimate Partner Violence:   . Fear of Current or Ex-Partner: Not on file  . Emotionally Abused: Not on file  . Physically Abused: Not on file  . Sexually Abused: Not on file        Objective:    BP 125/76   Pulse (!) 56   Temp 97.6 F (36.4 C) (Temporal)   Ht '5\' 10"'  (1.778 m)   Wt 220 lb 9.6 oz (100.1 kg)   SpO2 97%   BMI 31.65 kg/m   Wt Readings from Last 3 Encounters:  01/20/20 220 lb 9.6 oz (100.1 kg)  11/04/19 (!) 214 lb (97.1 kg)  07/21/19 218 lb 12.8 oz (99.2 kg)    Physical Exam Vitals reviewed.  Constitutional:      General: He is not in acute distress.    Appearance: Normal appearance. He is obese. He is not ill-appearing, toxic-appearing or diaphoretic.  HENT:     Head: Normocephalic and  atraumatic.     Right Ear: Tympanic membrane, ear canal and external ear normal. There is no impacted cerumen.     Left Ear: Tympanic membrane, ear canal and external ear normal. There is no impacted cerumen.     Nose: Nose normal. No congestion or rhinorrhea.     Mouth/Throat:     Mouth: Mucous membranes are moist.     Pharynx: Oropharynx is clear. No oropharyngeal exudate or posterior oropharyngeal  erythema.  Eyes:     General: No scleral icterus.       Right eye: No discharge.        Left eye: No discharge.     Conjunctiva/sclera: Conjunctivae normal.     Pupils: Pupils are equal, round, and reactive to light.  Neck:     Vascular: No carotid bruit.  Cardiovascular:     Rate and Rhythm: Normal rate and regular rhythm.     Heart sounds: Normal heart sounds. No murmur heard.  No friction rub. No gallop.   Pulmonary:     Effort: Pulmonary effort is normal. No respiratory distress.     Breath sounds: Normal breath sounds. No stridor. No wheezing, rhonchi or rales.  Abdominal:     General: Abdomen is flat. Bowel sounds are normal. There is no distension.     Palpations: Abdomen is soft. There is no mass.     Tenderness: There is no abdominal tenderness. There is no guarding or rebound.     Hernia: No hernia is present.  Musculoskeletal:        General: Normal range of motion.     Cervical back: Normal range of motion and neck supple. No rigidity. No muscular tenderness.     Right lower leg: No edema.     Left lower leg: No edema.  Lymphadenopathy:     Cervical: No cervical adenopathy.  Skin:    General: Skin is warm and dry.     Capillary Refill: Capillary refill takes less than 2 seconds.  Neurological:     General: No focal deficit present.     Mental Status: He is alert and oriented to person, place, and time. Mental status is at baseline.  Psychiatric:        Mood and Affect: Mood normal.        Behavior: Behavior normal.        Thought Content: Thought content normal.          Judgment: Judgment normal.     Lab Results  Component Value Date   TSH 1.500 07/21/2019   Lab Results  Component Value Date   WBC 4.2 07/21/2019   HGB 13.5 07/21/2019   HCT 40.8 07/21/2019   MCV 95 07/21/2019   PLT 258 07/21/2019   Lab Results  Component Value Date   NA 140 07/21/2019   K 4.5 07/21/2019   CO2 23 07/21/2019   GLUCOSE 94 07/21/2019   BUN 30 (H) 07/21/2019   CREATININE 1.29 (H) 07/21/2019   BILITOT 0.6 07/21/2019   ALKPHOS 51 07/21/2019   AST 24 07/21/2019   ALT 22 07/21/2019   PROT 6.4 07/21/2019   ALBUMIN 3.8 07/21/2019   CALCIUM 9.8 07/21/2019   Lab Results  Component Value Date   CHOL 160 07/21/2019   Lab Results  Component Value Date   HDL 64 07/21/2019   Lab Results  Component Value Date   LDLCALC 83 07/21/2019   Lab Results  Component Value Date   TRIG 64 07/21/2019   Lab Results  Component Value Date   CHOLHDL 2.5 07/21/2019   Lab Results  Component Value Date   HGBA1C 5.6 01/26/2019

## 2020-01-21 LAB — CMP14+EGFR
ALT: 24 IU/L (ref 0–44)
AST: 25 IU/L (ref 0–40)
Albumin/Globulin Ratio: 1.8 (ref 1.2–2.2)
Albumin: 4.1 g/dL (ref 3.7–4.7)
Alkaline Phosphatase: 53 IU/L (ref 44–121)
BUN/Creatinine Ratio: 21 (ref 10–24)
BUN: 23 mg/dL (ref 8–27)
Bilirubin Total: 0.6 mg/dL (ref 0.0–1.2)
CO2: 24 mmol/L (ref 20–29)
Calcium: 9.3 mg/dL (ref 8.6–10.2)
Chloride: 104 mmol/L (ref 96–106)
Creatinine, Ser: 1.08 mg/dL (ref 0.76–1.27)
GFR calc Af Amer: 79 mL/min/{1.73_m2} (ref >59)
GFR calc non Af Amer: 69 mL/min/{1.73_m2} (ref >59)
Globulin, Total: 2.3 g/dL (ref 1.5–4.5)
Glucose: 95 mg/dL (ref 65–99)
Potassium: 4.3 mmol/L (ref 3.5–5.2)
Sodium: 140 mmol/L (ref 134–144)
Total Protein: 6.4 g/dL (ref 6.0–8.5)

## 2020-01-21 LAB — PSA, TOTAL AND FREE
PSA, Free Pct: 22.3 %
PSA, Free: 0.98 ng/mL
Prostate Specific Ag, Serum: 4.4 ng/mL — ABNORMAL HIGH (ref 0.0–4.0)

## 2020-01-21 LAB — LIPID PANEL
Chol/HDL Ratio: 2.6 ratio (ref 0.0–5.0)
Cholesterol, Total: 150 mg/dL (ref 100–199)
HDL: 57 mg/dL (ref >39)
LDL Chol Calc (NIH): 83 mg/dL (ref 0–99)
Triglycerides: 48 mg/dL (ref 0–149)
VLDL Cholesterol Cal: 10 mg/dL (ref 5–40)

## 2020-01-21 LAB — TSH: TSH: 1.21 u[IU]/mL (ref 0.450–4.500)

## 2020-01-23 NOTE — Progress Notes (Signed)
Abstracted

## 2020-06-06 ENCOUNTER — Encounter: Payer: Self-pay | Admitting: Family Medicine

## 2020-06-06 ENCOUNTER — Ambulatory Visit: Payer: Medicare HMO | Admitting: Family Medicine

## 2020-06-06 DIAGNOSIS — J329 Chronic sinusitis, unspecified: Secondary | ICD-10-CM

## 2020-06-06 DIAGNOSIS — J40 Bronchitis, not specified as acute or chronic: Secondary | ICD-10-CM

## 2020-06-06 MED ORDER — PREDNISONE 10 MG PO TABS: ORAL_TABLET | ORAL | 0 refills | Status: DC

## 2020-06-06 MED ORDER — AZITHROMYCIN 250 MG PO TABS: ORAL_TABLET | ORAL | 0 refills | Status: DC

## 2020-06-06 NOTE — Progress Notes (Signed)
    Subjective:    Patient ID: Shawn Ochoa, male    DOB: [birth year], 70 y.o.   MRN: [id number]   HPI: Shawn Ochoa is a 70 y.o. male presenting for flu sx chills , fever 99.2 today , deep dry chest cough. Onset 5 days ago. Denies dyspnea. Has a wheeze at the end of his breath. Feels weak. Runny nose. Able to hold down fluids. Had two negative home covid tests. Nonsmoker.   Depression screen [facility] 2/9 01/20/2020 09/27/2019 01/26/2019 09/22/2018 01/27/2018  Decreased Interest 0 0 0 0 0  Down, Depressed, Hopeless 0 0 0 0 0  PHQ - 2 Score 0 0 0 0 0     Relevant past medical, surgical, family and social history reviewed and updated as indicated.  Interim medical history since our last visit reviewed. Allergies and medications reviewed and updated.  ROS:  Review of Systems   Social History   Tobacco Use  Smoking Status Never Smoker  Smokeless Tobacco Never Used       Objective:     Wt Readings from Last 3 Encounters:  01/20/20 220 lb 9.6 oz (100.1 kg)  11/04/19 (!) 214 lb (97.1 kg)  07/21/19 218 lb 12.8 oz (99.2 kg)     Exam deferred. Pt. Harboring due to COVID 19. Phone visit performed.   Assessment & Plan:   1. Sinobronchitis     Meds ordered this encounter  Medications  . predniSONE (DELTASONE) 10 MG tablet    Sig: Take 5 daily for 2 days followed by 4,3,2 and 1 for 2 days each.    Dispense:  30 tablet    Refill:  0  . azithromycin (ZITHROMAX Z-PAK) 250 MG tablet    Sig: Take two right away Then one a day for the next 4 days.    Dispense:  6 each    Refill:  0    No orders of the defined types were placed in this encounter.     Diagnoses and all orders for this visit:  Sinobronchitis  Other orders -     predniSONE (DELTASONE) 10 MG tablet; Take 5 daily for 2 days followed by 4,3,2 and 1 for 2 days each. -     azithromycin (ZITHROMAX Z-PAK) 250 MG tablet; Take two right away Then one a day for the next 4 days.    Virtual Visit via  telephone Note  I discussed the limitations, risks, security and privacy concerns of performing an evaluation and management service by telephone and the availability of in person appointments. The patient was identified with two identifiers. Pt.expressed understanding and agreed to proceed. Pt. Is at home. Dr. Livia Snellen is in his office.  Follow Up Instructions:   I discussed the assessment and treatment plan with the patient. The patient was provided an opportunity to ask questions and all were answered. The patient agreed with the plan and demonstrated an understanding of the instructions.   The patient was advised to call back or seek an in-person evaluation if the symptoms worsen or if the condition fails to improve as anticipated.   Total minutes including chart review and phone contact time: 12   Follow up plan: Return if symptoms worsen or fail to improve.  Claretta Fraise, MD [place]

## 2020-06-20 ENCOUNTER — Encounter: Payer: Self-pay | Admitting: Family Medicine

## 2020-06-20 ENCOUNTER — Ambulatory Visit (INDEPENDENT_AMBULATORY_CARE_PROVIDER_SITE_OTHER): Payer: Medicare HMO | Admitting: Family Medicine

## 2020-06-20 ENCOUNTER — Ambulatory Visit (INDEPENDENT_AMBULATORY_CARE_PROVIDER_SITE_OTHER): Payer: Medicare HMO

## 2020-06-20 DIAGNOSIS — R059 Cough, unspecified: Secondary | ICD-10-CM | POA: Diagnosis not present

## 2020-06-20 DIAGNOSIS — R062 Wheezing: Secondary | ICD-10-CM

## 2020-06-20 MED ORDER — ALBUTEROL SULFATE HFA 108 (90 BASE) MCG/ACT IN AERS
2.0000 | INHALATION_SPRAY | Freq: Four times a day (QID) | RESPIRATORY_TRACT | 1 refills | Status: DC | PRN
Start: 2020-06-20 — End: 2021-04-02

## 2020-06-20 NOTE — Addendum Note (Signed)
Addended by: Liliane Bade on: 06/20/2020 04:29 PM   Modules accepted: Orders

## 2020-06-20 NOTE — Progress Notes (Signed)
Virtual Visit via Telephone Note  I connected with Shawn Ochoa on 06/20/20 at 8:50 AM by telephone and verified that I am speaking with the correct person using two identifiers. Shawn Ochoa is currently located at home and his wife is currently with him during this visit. The provider, Loman Brooklyn, FNP is located in their office at time of visit.  I discussed the limitations, risks, security and privacy concerns of performing an evaluation and management service by telephone and the availability of in person appointments. I also discussed with the patient that there may be a patient responsible charge related to this service. The patient expressed understanding and agreed to proceed.  Subjective: PCP: Loman Brooklyn, FNP  Chief Complaint  Patient presents with  . Cough   Patient reports he was treated with a Z-Pak and prednisone taper two weeks ago after being sick for a week.  He reports he continues to have a cough with deep breathing or laughing and feels fatigued.  States he may still have a low-grade fever but is not sure as he has not checked it.  He did take two at home COVID-19 test which were negative.   ROS: Per HPI  Current Outpatient Medications:  .  aspirin 81 MG EC tablet, Take 81 mg by mouth daily.  , Disp: , Rfl:  .  atorvastatin (LIPITOR) 20 MG tablet, Take 1 tablet (20 mg total) by mouth daily., Disp: 90 tablet, Rfl: 3 .  azithromycin (ZITHROMAX Z-PAK) 250 MG tablet, Take two right away Then one a day for the next 4 days., Disp: 6 each, Rfl: 0 .  benazepril (LOTENSIN) 20 MG tablet, Take 1 tablet (20 mg total) by mouth daily., Disp: 90 tablet, Rfl: 3 .  Calcium Carbonate-Vitamin D (CALCIUM + D PO), Take 1 capsule by mouth daily.  , Disp: , Rfl:  .  Cholecalciferol (VITAMIN D3) 5000 UNITS CAPS, Take 1 capsule by mouth daily.  , Disp: , Rfl:  .  fluticasone (FLONASE) 50 MCG/ACT nasal spray, Place 1 spray into both nostrils daily., Disp: 48 mL,  Rfl: 3 .  levothyroxine (SYNTHROID) 75 MCG tablet, Take 1 tablet (75 mcg total) by mouth daily., Disp: 90 tablet, Rfl: 3 .  Omega-3 Fatty Acids (FISH OIL) 1000 MG CAPS, Take 1 capsule by mouth 2 (two) times daily.  , Disp: , Rfl:  .  pioglitazone (ACTOS) 30 MG tablet, Take 1 tablet (30 mg total) by mouth daily., Disp: 90 tablet, Rfl: 3 .  predniSONE (DELTASONE) 10 MG tablet, Take 5 daily for 2 days followed by 4,3,2 and 1 for 2 days each., Disp: 30 tablet, Rfl: 0  Allergies  Allergen Reactions  . Mobic [Meloxicam] Other (See Comments)    Rectal bleeding with only 2 pills    Past Medical History:  Diagnosis Date  . Abnormal glucose   . Adenomatous polyp   . Cardiac dysrhythmia, unspecified   . Cataract   . Essential hypertension, benign   . Hemorrhage of gastrointestinal tract, unspecified   . Metabolic syndrome   . Nontoxic multinodular goiter   . Other and unspecified hyperlipidemia   . Prostatism     Observations/Objective: A&O  No respiratory distress or wheezing audible over the phone Mood, judgement, and thought processes all WNL  Assessment and Plan: 1. Cough Patient is going to come for COVID-19 testing and a chest x-ray this afternoon just to make sure nothing more is going on. - DG Chest 2 View;  Future - Novel Coronavirus, NAA ([facility]); Future  2. Wheezing Instructions provided on how to use an albuterol inhaler. - albuterol (VENTOLIN HFA) 108 (90 Base) MCG/ACT inhaler; Inhale 2 puffs into the lungs every 6 (six) hours as needed for wheezing or shortness of breath.  Dispense: 18 g; Refill: 1   Follow Up Instructions:  I discussed the assessment and treatment plan with the patient. The patient was provided an opportunity to ask questions and all were answered. The patient agreed with the plan and demonstrated an understanding of the instructions.   The patient was advised to call back or seek an in-person evaluation if the symptoms worsen or if the condition  fails to improve as anticipated.  The above assessment and management plan was discussed with the patient. The patient verbalized understanding of and has agreed to the management plan. Patient is aware to call the clinic if symptoms persist or worsen. Patient is aware when to return to the clinic for a follow-up visit. Patient educated on when it is appropriate to go to the emergency department.   Time call ended: 9:01 AM  I provided 11 minutes of non-face-to-face time during this encounter.  Hendricks Limes, MSN, APRN, FNP-C [facility] 06/20/20

## 2020-06-21 LAB — NOVEL CORONAVIRUS, NAA: SARS-CoV-2, NAA: NOT DETECTED

## 2020-06-21 LAB — SARS-COV-2, NAA 2 DAY TAT

## 2020-07-18 ENCOUNTER — Other Ambulatory Visit: Payer: Self-pay | Admitting: *Deleted

## 2020-07-18 DIAGNOSIS — E890 Postprocedural hypothyroidism: Secondary | ICD-10-CM

## 2020-07-18 MED ORDER — LEVOTHYROXINE SODIUM 75 MCG PO TABS: 75.0000 ug | ORAL_TABLET | Freq: Every day | ORAL | 1 refills | Status: DC

## 2020-08-02 ENCOUNTER — Other Ambulatory Visit: Payer: Self-pay | Admitting: *Deleted

## 2020-08-02 DIAGNOSIS — E782 Mixed hyperlipidemia: Secondary | ICD-10-CM

## 2020-08-02 DIAGNOSIS — I10 Essential (primary) hypertension: Secondary | ICD-10-CM

## 2020-08-02 MED ORDER — BENAZEPRIL HCL 20 MG PO TABS
20.0000 mg | ORAL_TABLET | Freq: Every day | ORAL | 0 refills | Status: DC
Start: 2020-08-02 — End: 2020-08-28

## 2020-08-02 MED ORDER — ATORVASTATIN CALCIUM 20 MG PO TABS: 20.0000 mg | ORAL_TABLET | Freq: Every day | ORAL | 0 refills | Status: DC

## 2020-08-06 ENCOUNTER — Other Ambulatory Visit: Payer: Self-pay | Admitting: Family Medicine

## 2020-08-06 DIAGNOSIS — I10 Essential (primary) hypertension: Secondary | ICD-10-CM

## 2020-08-06 DIAGNOSIS — E782 Mixed hyperlipidemia: Secondary | ICD-10-CM

## 2020-08-25 ENCOUNTER — Other Ambulatory Visit: Payer: Self-pay | Admitting: Family Medicine

## 2020-08-25 DIAGNOSIS — I10 Essential (primary) hypertension: Secondary | ICD-10-CM

## 2020-08-27 NOTE — Telephone Encounter (Signed)
Shawn Ochoa. NTBS 30 days given 08/02/20

## 2020-08-28 ENCOUNTER — Encounter: Payer: Self-pay | Admitting: *Deleted

## 2020-08-28 MED ORDER — BENAZEPRIL HCL 20 MG PO TABS: 20.0000 mg | ORAL_TABLET | Freq: Every day | ORAL | 0 refills | Status: DC

## 2020-08-28 NOTE — Addendum Note (Signed)
Addended by: Zannie Cove on: 08/28/2020 01:38 PM   Modules accepted: Orders

## 2020-09-13 ENCOUNTER — Other Ambulatory Visit: Payer: Self-pay | Admitting: Family Medicine

## 2020-09-13 ENCOUNTER — Other Ambulatory Visit: Payer: Self-pay | Admitting: *Deleted

## 2020-09-13 DIAGNOSIS — E782 Mixed hyperlipidemia: Secondary | ICD-10-CM

## 2020-09-13 DIAGNOSIS — I10 Essential (primary) hypertension: Secondary | ICD-10-CM

## 2020-09-13 MED ORDER — ATORVASTATIN CALCIUM 20 MG PO TABS: 20.0000 mg | ORAL_TABLET | Freq: Every day | ORAL | 1 refills | Status: DC

## 2020-09-14 NOTE — Telephone Encounter (Signed)
Shawn Ochoa NTBS 30 days given 08/28/20

## 2020-09-25 ENCOUNTER — Other Ambulatory Visit: Payer: Self-pay | Admitting: Family Medicine

## 2020-09-25 DIAGNOSIS — J301 Allergic rhinitis due to pollen: Secondary | ICD-10-CM

## 2020-09-25 DIAGNOSIS — E8881 Metabolic syndrome: Secondary | ICD-10-CM

## 2020-09-27 ENCOUNTER — Ambulatory Visit (INDEPENDENT_AMBULATORY_CARE_PROVIDER_SITE_OTHER): Payer: Medicare HMO

## 2020-09-27 VITALS — BP 119/74 | Ht 70.0 in | Wt 224.0 lb

## 2020-09-27 DIAGNOSIS — Z0000 Encounter for general adult medical examination without abnormal findings: Secondary | ICD-10-CM | POA: Diagnosis not present

## 2020-09-27 NOTE — Patient Instructions (Signed)
[name] , Thank you for taking time to come for your Medicare Wellness Visit. I appreciate your ongoing commitment to your health goals. Please review the following plan we discussed and let me know if I can assist you in the future.   Screening recommendations/referrals: Colonoscopy: Done 01/02/2020 - Repeat in 5 years Recommended yearly ophthalmology/optometry visit for glaucoma screening and checkup Recommended yearly dental visit for hygiene and checkup  Vaccinations: Influenza vaccine: Done 01/20/2020 - Repeat annually Pneumococcal vaccine: Done 05/12/2014 & 06/18/2016 Tdap vaccine: Done 01/27/2018 - Repeat in 10 years Shingles vaccine: Zostavax done 07/26/2009 - Due for shingrix (2 doses 2-6 months apart)   Covid-19: Done 05/10/19, 06/07/19, 02/15/20, & 08/15/20  Advanced directives: Please bring a copy of your health care power of attorney and living will to the office to be added to your chart at your convenience.  Conditions/risks identified: Aim for 30 minutes of exercise or brisk walking each day, drink 6-8 glasses of water and eat lots of fruits and vegetables.  Next appointment: Follow up in one year for your annual wellness visit.   Preventive Care 4 Years and Older, Male  Preventive care refers to lifestyle choices and visits with your health care provider that can promote health and wellness. What does preventive care include? A yearly physical exam. This is also called an annual well check. Dental exams once or twice a year. Routine eye exams. Ask your health care provider how often you should have your eyes checked. Personal lifestyle choices, including: Daily care of your teeth and gums. Regular physical activity. Eating a healthy diet. Avoiding tobacco and drug use. Limiting alcohol use. Practicing safe sex. Taking low doses of aspirin every day. Taking vitamin and mineral supplements as recommended by your health care provider. What happens during an annual well  check? The services and screenings done by your health care provider during your annual well check will depend on your age, overall health, lifestyle risk factors, and family history of disease. Counseling  Your health care provider may ask you questions about your: Alcohol use. Tobacco use. Drug use. Emotional well-being. Home and relationship well-being. Sexual activity. Eating habits. History of falls. Memory and ability to understand (cognition). Work and work Statistician. Screening  You may have the following tests or measurements: Height, weight, and BMI. Blood pressure. Lipid and cholesterol levels. These may be checked every 5 years, or more frequently if you are over 4 years old. Skin check. Lung cancer screening. You may have this screening every year starting at age 43 if you have a 30-pack-year history of smoking and currently smoke or have quit within the past 15 years. Fecal occult blood test (FOBT) of the stool. You may have this test every year starting at age 40. Flexible sigmoidoscopy or colonoscopy. You may have a sigmoidoscopy every 5 years or a colonoscopy every 10 years starting at age 73. Prostate cancer screening. Recommendations will vary depending on your family history and other risks. Hepatitis C blood test. Hepatitis B blood test. Sexually transmitted disease (STD) testing. Diabetes screening. This is done by checking your blood sugar (glucose) after you have not eaten for a while (fasting). You may have this done every 1-3 years. Abdominal aortic aneurysm (AAA) screening. You may need this if you are a current or former smoker. Osteoporosis. You may be screened starting at age 45 if you are at high risk. Talk with your health care provider about your test results, treatment options, and if necessary, the need  for more tests. Vaccines  Your health care provider may recommend certain vaccines, such as: Influenza vaccine. This is recommended every  year. Tetanus, diphtheria, and acellular pertussis (Tdap, Td) vaccine. You may need a Td booster every 10 years. Zoster vaccine. You may need this after age 79. Pneumococcal 13-valent conjugate (PCV13) vaccine. One dose is recommended after age 81. Pneumococcal polysaccharide (PPSV23) vaccine. One dose is recommended after age 70. Talk to your health care provider about which screenings and vaccines you need and how often you need them. This information is not intended to replace advice given to you by your health care provider. Make sure you discuss any questions you have with your health care provider. Document Released: 04/27/2015 Document Revised: 12/19/2015 Document Reviewed: 01/30/2015 Elsevier Interactive Patient Education  2017 [place] Prevention in the Home Falls can cause injuries. They can happen to people of all ages. There are many things you can do to make your home safe and to help prevent falls. What can I do on the outside of my home? Regularly fix the edges of walkways and driveways and fix any cracks. Remove anything that might make you trip as you walk through a door, such as a raised step or threshold. Trim any bushes or trees on the path to your home. Use bright outdoor lighting. Clear any walking paths of anything that might make someone trip, such as rocks or tools. Regularly check to see if handrails are loose or broken. Make sure that both sides of any steps have handrails. Any raised decks and porches should have guardrails on the edges. Have any leaves, snow, or ice cleared regularly. Use sand or salt on walking paths during winter. Clean up any spills in your garage right away. This includes oil or grease spills. What can I do in the bathroom? Use night lights. Install grab bars by the toilet and in the tub and shower. Do not use towel bars as grab bars. Use non-skid mats or decals in the tub or shower. If you need to sit down in the shower, use a  plastic, non-slip stool. Keep the floor dry. Clean up any water that spills on the floor as soon as it happens. Remove soap buildup in the tub or shower regularly. Attach bath mats securely with double-sided non-slip rug tape. Do not have throw rugs and other things on the floor that can make you trip. What can I do in the bedroom? Use night lights. Make sure that you have a light by your bed that is easy to reach. Do not use any sheets or blankets that are too big for your bed. They should not hang down onto the floor. Have a firm chair that has side arms. You can use this for support while you get dressed. Do not have throw rugs and other things on the floor that can make you trip. What can I do in the kitchen? Clean up any spills right away. Avoid walking on wet floors. Keep items that you use a lot in easy-to-reach places. If you need to reach something above you, use a strong step stool that has a grab bar. Keep electrical cords out of the way. Do not use floor polish or wax that makes floors slippery. If you must use wax, use non-skid floor wax. Do not have throw rugs and other things on the floor that can make you trip. What can I do with my stairs? Do not leave any items on the stairs.  Make sure that there are handrails on both sides of the stairs and use them. Fix handrails that are broken or loose. Make sure that handrails are as long as the stairways. Check any carpeting to make sure that it is firmly attached to the stairs. Fix any carpet that is loose or worn. Avoid having throw rugs at the top or bottom of the stairs. If you do have throw rugs, attach them to the floor with carpet tape. Make sure that you have a light switch at the top of the stairs and the bottom of the stairs. If you do not have them, ask someone to add them for you. What else can I do to help prevent falls? Wear shoes that: Do not have high heels. Have rubber bottoms. Are comfortable and fit you  well. Are closed at the toe. Do not wear sandals. If you use a stepladder: Make sure that it is fully opened. Do not climb a closed stepladder. Make sure that both sides of the stepladder are locked into place. Ask someone to hold it for you, if possible. Clearly mark and make sure that you can see: Any grab bars or handrails. First and last steps. Where the edge of each step is. Use tools that help you move around (mobility aids) if they are needed. These include: Canes. Walkers. Scooters. Crutches. Turn on the lights when you go into a dark area. Replace any light bulbs as soon as they burn out. Set up your furniture so you have a clear path. Avoid moving your furniture around. If any of your floors are uneven, fix them. If there are any pets around you, be aware of where they are. Review your medicines with your doctor. Some medicines can make you feel dizzy. This can increase your chance of falling. Ask your doctor what other things that you can do to help prevent falls. This information is not intended to replace advice given to you by your health care provider. Make sure you discuss any questions you have with your health care provider. Document Released: 01/25/2009 Document Revised: 09/06/2015 Document Reviewed: 05/05/2014 Elsevier Interactive Patient Education  2017 [employer].

## 2020-09-27 NOTE — Progress Notes (Signed)
Subjective:   Shawn Ochoa is a 70 y.o. male who presents for Medicare Annual/Subsequent preventive examination.  Virtual Visit via Telephone Note  I connected with  Shawn Ochoa on 09/27/20 at  9:45 AM EDT by telephone and verified that I am speaking with the correct person using two identifiers.  Location: Patient: Home Provider: [facility] Persons participating in the virtual visit: patient/Nurse Health Advisor   I discussed the limitations, risks, security and privacy concerns of performing an evaluation and management service by telephone and the availability of in person appointments. The patient expressed understanding and agreed to proceed.  Interactive audio and video telecommunications were attempted between this nurse and patient, however failed, due to patient having technical difficulties OR patient did not have access to video capability.  We continued and completed visit with audio only.  Some vital signs may be absent or patient reported.   Martin Belling E Edmar Blankenburg, LPN   Review of Systems     Cardiac Risk Factors include: advanced age (>31men, >36 women);obesity (BMI >30kg/m2);dyslipidemia;male gender;hypertension     Objective:    Today's Vitals   09/27/20 0951  BP: 119/74  Weight: 224 lb (101.6 kg)  Height: 5\' 10"  (1.778 m)  PainSc: 2    Body mass index is 32.14 kg/m.  Advanced Directives 09/27/2020 09/27/2019 09/22/2018  Does Patient Have a Medical Advance Directive? Yes Yes Yes  Type of Paramedic of [place];Living will [place];Living will Living will;Healthcare Power of Attorney  Does patient want to make changes to medical advance directive? - No - Patient declined No - Patient declined  Copy of [place] in Chart? No - copy requested - No - copy requested    Current Medications (verified) Outpatient Encounter Medications as of 09/27/2020  Medication Sig   albuterol (VENTOLIN HFA)  108 (90 Base) MCG/ACT inhaler Inhale 2 puffs into the lungs every 6 (six) hours as needed for wheezing or shortness of breath.   aspirin 81 MG EC tablet Take 81 mg by mouth daily.     atorvastatin (LIPITOR) 20 MG tablet Take 1 tablet (20 mg total) by mouth daily. (NEEDS TO BE SEEN BEFORE NEXT REFILL)   benazepril (LOTENSIN) 20 MG tablet Take 1 tablet (20 mg total) by mouth daily. (NEEDS TO BE SEEN BEFORE NEXT REFILL)   Calcium Carbonate-Vitamin D (CALCIUM + D PO) Take 1 capsule by mouth daily.     Cholecalciferol (VITAMIN D3) 5000 UNITS CAPS Take 1 capsule by mouth daily.     DENTAGEL 1.1 % GEL dental gel PLEASE SEE ATTACHED FOR DETAILED DIRECTIONS   fluticasone (FLONASE) 50 MCG/ACT nasal spray Place 1 spray into both nostrils daily. (NEEDS TO BE SEEN BEFORE NEXT REFILL)   levothyroxine (SYNTHROID) 75 MCG tablet Take 1 tablet (75 mcg total) by mouth daily.   Omega-3 Fatty Acids (FISH OIL) 1000 MG CAPS Take 1 capsule by mouth 2 (two) times daily.     pioglitazone (ACTOS) 30 MG tablet Take 1 tablet (30 mg total) by mouth daily. (NEEDS TO BE SEEN BEFORE NEXT REFILL)   No facility-administered encounter medications on file as of 09/27/2020.    Allergies (verified) Mobic [meloxicam]   History: Past Medical History:  Diagnosis Date   Abnormal glucose    Adenomatous polyp    Cardiac dysrhythmia, unspecified    Cataract    Essential hypertension, benign    Hemorrhage of gastrointestinal tract, unspecified    Metabolic syndrome    Nontoxic  multinodular goiter    Other and unspecified hyperlipidemia    Prostatism    Past Surgical History:  Procedure Laterality Date   CATARACT EXTRACTION     KNEE CARTILAGE SURGERY Right    THYROIDECTOMY     Family History  Problem Relation Age of Onset   Diabetes Mother    Hypertension Mother    Heart disease Mother    Stroke Father    Hypertension Father    Colon cancer Maternal Uncle 51   Colon cancer Other 59   Social History   Socioeconomic  History   Marital status: Married    Spouse name: Hilda Blades   Number of children: 3   Years of education: Not on file   Highest education level: Professional school degree (e.g., MD, DDS, DVM, JD)  Occupational History   Not on file  Tobacco Use   Smoking status: Never   Smokeless tobacco: Never  Vaping Use   Vaping Use: Never used  Substance and Sexual Activity   Alcohol use: Yes    Comment: rare   Drug use: No   Sexual activity: Not Currently  Other Topics Concern   Not on file  Social History Narrative   Lives home with wife; children live in [place]    Social Determinants of Health   Financial Resource Strain: Low Risk    Difficulty of Paying Living Expenses: Not hard at all  Food Insecurity: No Food Insecurity   Worried About Charity fundraiser in the Last Year: Never true   [place] in the Last Year: Never true  Transportation Needs: No Transportation Needs   Lack of Transportation (Medical): No   Lack of Transportation (Non-Medical): No  Physical Activity: Sufficiently Active   Days of Exercise per Week: 7 days   Minutes of Exercise per Session: 60 min  Stress: No Stress Concern Present   Feeling of Stress : Not at all  Social Connections: Socially Integrated   Frequency of Communication with Friends and Family: Twice a week   Frequency of Social Gatherings with Friends and Family: Twice a week   Attends Religious Services: More than 4 times per year   Active Member of [employer] or Organizations: Yes   Attends Music therapist: More than 4 times per year   Marital Status: Married    Tobacco Counseling Counseling given: Not Answered   Clinical Intake:  Pre-visit preparation completed: Yes  Pain : 0-10 Pain Score: 2  (a times if he uses it, pain is a sharp 10) Pain Type: Chronic pain Pain Location: Elbow Pain Orientation: Left Pain Radiating Towards: left forearm Pain Descriptors / Indicators: Aching, Discomfort, Dull, Sharp,  Sore Pain Onset: More than a month ago Pain Frequency: Intermittent     BMI - recorded: 32.14 Nutritional Status: BMI > 30  Obese Nutritional Risks: None Diabetes: No  How often do you need to have someone help you when you read instructions, pamphlets, or other written materials from your doctor or pharmacy?: 1 - Never  Diabetic? No  Interpreter Needed?: No  Information entered by :: Markey Deady, LPN   Activities of Daily Living In your present state of health, do you have any difficulty performing the following activities: 09/27/2020  Hearing? N  Vision? N  Difficulty concentrating or making decisions? N  Walking or climbing stairs? N  Dressing or bathing? N  Doing errands, shopping? N  Preparing Food and eating ? N  Using the Toilet? N  In  the past six months, have you accidently leaked urine? N  Do you have problems with loss of bowel control? N  Managing your Medications? N  Managing your Finances? N  Housekeeping or managing your Housekeeping? N  Some recent data might be hidden    Patient Care Team: Loman Brooklyn, FNP as PCP - General (Family Medicine) [facility]  Indicate any recent Medical Services you may have received from other than [facility] providers in the past year (date may be approximate).     Assessment:   This is a routine wellness examination for Shawn Ochoa.  Hearing/Vision screen Hearing Screening - Comments:: Denies hearing difficulties  Vision Screening - Comments:: Wears reading glasses only - had lens replacement surgery around 2017 - up to date with annual to semi-annual eye exams with Dr Katy Fitch  Dietary issues and exercise activities discussed: Current Exercise Habits: Home exercise routine, Type of exercise: walking, Time (Minutes): 60, Frequency (Times/Week): 7, Weekly Exercise (Minutes/Week): 420, Intensity: Mild, Exercise limited by: None identified   Goals Addressed             This Visit's Progress    Client  will verbalize understanding of resources for managing BMI        His goal is 7000 steps per day - He hopes to get up to walking 3 miles per day        Depression Screen PHQ 2/9 Scores 09/27/2020 01/20/2020 09/27/2019 01/26/2019 09/22/2018 01/27/2018 07/17/2017  PHQ - 2 Score 0 0 0 0 0 0 0    Fall Risk Fall Risk  09/27/2020 01/20/2020 09/27/2019 01/26/2019 09/22/2018  Falls in the past year? 0 0 0 0 0  Number falls in past yr: 0 - - - 0  Injury with Fall? 0 - - - 0  Risk for fall due to : No Fall Risks - - - -  Follow up Falls prevention discussed - - - -    FALL RISK PREVENTION PERTAINING TO THE HOME:  Any stairs in or around the home? Yes  If so, are there any without handrails? No  Home free of loose throw rugs in walkways, pet beds, electrical cords, etc? Yes  Adequate lighting in your home to reduce risk of falls? Yes   ASSISTIVE DEVICES UTILIZED TO PREVENT FALLS:  Life alert? No  Use of a cane, walker or w/c? No  Grab bars in the bathroom? Yes  Shower chair or bench in shower? Yes  Elevated toilet seat or a handicapped toilet? No   TIMED UP AND GO:  Was the test performed? No . Telephonic visit.  Cognitive Function: Normal cognitive status assessed by direct observation by this Nurse Health Advisor. No abnormalities found.       6CIT Screen 09/27/2019 09/22/2018  What Year? 0 points 0 points  What month? 0 points 0 points  What time? 0 points 0 points  Count back from 20 0 points 0 points  Months in reverse 0 points 0 points  Repeat phrase - 0 points  Total Score - 0    Immunizations Immunization History  Administered Date(s) Administered   Fluad Quad(high Dose 65+) 01/26/2019, 01/20/2020   Influenza Split 01/15/2016   Influenza Whole 01/12/2009   Influenza, High Dose Seasonal PF 01/06/2017, 01/27/2018   Influenza-Unspecified 01/12/2014, 02/22/2015   Moderna Sars-Covid-2 Vaccination 05/10/2019, 06/07/2019, 02/15/2020, 08/15/2020   Pneumococcal Conjugate-13  05/12/2014   Pneumococcal Polysaccharide-23 06/12/1996, 06/18/2016   Td 02/12/2005, 01/27/2018   Tdap 01/10/2010   Zoster,  Live 07/26/2009    TDAP status: Up to date  Flu Vaccine status: Up to date  Pneumococcal vaccine status: Up to date  Covid-19 vaccine status: Completed vaccines  Qualifies for Shingles Vaccine? Yes   Zostavax completed Yes   Shingrix Completed?: No.    Education has been provided regarding the importance of this vaccine. Patient has been advised to call insurance company to determine out of pocket expense if they have not yet received this vaccine. Advised may also receive vaccine at local pharmacy or Health Dept. Verbalized acceptance and understanding.  Screening Tests Health Maintenance  Topic Date Due   Zoster Vaccines- Shingrix (1 of 2) Never done   INFLUENZA VACCINE  11/12/2020   COLONOSCOPY (Pts 45-65yrs Insurance coverage will need to be confirmed)  01/01/2025   TETANUS/TDAP  01/28/2028   COVID-19 Vaccine  Completed   Hepatitis C Screening  Completed   PNA vac Low Risk Adult  Completed   HPV VACCINES  Aged Out   COLON CANCER SCREENING ANNUAL FOBT  Discontinued    Health Maintenance  Health Maintenance Due  Topic Date Due   Zoster Vaccines- Shingrix (1 of 2) Never done    Colorectal cancer screening: Type of screening: Colonoscopy. Completed 01/02/2020. Repeat every 5 years  Lung Cancer Screening: (Low Dose CT Chest recommended if Age 70-80 years, 30 pack-year currently smoking OR have quit w/in 15years.) does not qualify.   Additional Screening:  Hepatitis C Screening: does qualify; Completed 01/06/2017  Vision Screening: Recommended annual ophthalmology exams for early detection of glaucoma and other disorders of the eye. Is the patient up to date with their annual eye exam?  Yes  Who is the provider or what is the name of the office in which the patient attends annual eye exams? [facility] If pt is not established with a provider, would they  like to be referred to a provider to establish care? No .   Dental Screening: Recommended annual dental exams for proper oral hygiene  Community Resource Referral / Chronic Care Management: CRR required this visit?  No   CCM required this visit?  No      Plan:     I have personally reviewed and noted the following in the patient's chart:   Medical and social history Use of alcohol, tobacco or illicit drugs  Current medications and supplements including opioid prescriptions. Patient is not currently taking opioid prescriptions. Functional ability and status Nutritional status Physical activity Advanced directives List of other physicians Hospitalizations, surgeries, and ER visits in previous 12 months Vitals Screenings to include cognitive, depression, and falls Referrals and appointments  In addition, I have reviewed and discussed with patient certain preventive protocols, quality metrics, and best practice recommendations. A written personalized care plan for preventive services as well as general preventive health recommendations were provided to patient.     Sandrea Hammond, LPN   9/48/0165   Nurse Notes: None

## 2020-09-30 ENCOUNTER — Other Ambulatory Visit: Payer: Self-pay | Admitting: Family Medicine

## 2020-09-30 DIAGNOSIS — I10 Essential (primary) hypertension: Secondary | ICD-10-CM

## 2020-10-02 ENCOUNTER — Other Ambulatory Visit: Payer: Self-pay

## 2020-10-02 ENCOUNTER — Encounter: Payer: Self-pay | Admitting: Family Medicine

## 2020-10-02 ENCOUNTER — Ambulatory Visit: Payer: Medicare HMO | Admitting: Family Medicine

## 2020-10-02 VITALS — BP 124/66 | HR 66 | Temp 97.7°F | Ht 70.0 in | Wt 226.8 lb

## 2020-10-02 DIAGNOSIS — I10 Essential (primary) hypertension: Secondary | ICD-10-CM

## 2020-10-02 DIAGNOSIS — E559 Vitamin D deficiency, unspecified: Secondary | ICD-10-CM

## 2020-10-02 DIAGNOSIS — E8881 Metabolic syndrome: Secondary | ICD-10-CM | POA: Diagnosis not present

## 2020-10-02 DIAGNOSIS — M7702 Medial epicondylitis, left elbow: Secondary | ICD-10-CM

## 2020-10-02 DIAGNOSIS — E890 Postprocedural hypothyroidism: Secondary | ICD-10-CM

## 2020-10-02 DIAGNOSIS — E782 Mixed hyperlipidemia: Secondary | ICD-10-CM | POA: Diagnosis not present

## 2020-10-02 DIAGNOSIS — Z23 Encounter for immunization: Secondary | ICD-10-CM

## 2020-10-02 MED ORDER — PREDNISONE 10 MG (21) PO TBPK: ORAL_TABLET | ORAL | 0 refills | Status: DC

## 2020-10-02 MED ORDER — BENAZEPRIL HCL 20 MG PO TABS: 20.0000 mg | ORAL_TABLET | Freq: Every day | ORAL | 1 refills | Status: DC

## 2020-10-02 MED ORDER — PIOGLITAZONE HCL 30 MG PO TABS
30.0000 mg | ORAL_TABLET | Freq: Every day | ORAL | 1 refills | Status: DC
Start: 2020-10-02 — End: 2021-04-02

## 2020-10-02 NOTE — Progress Notes (Signed)
Assessment & Plan:  1. Essential hypertension Well controlled on current regimen.  - benazepril (LOTENSIN) 20 MG tablet; Take 1 tablet (20 mg total) by mouth daily.  Dispense: 90 tablet; Refill: 1 - CBC with Differential/Platelet - CMP14+EGFR - Lipid panel (not fasting) - TSH  2. Mixed hyperlipidemia Labs to assess. - CMP14+EGFR - Lipid panel (not fasting)  3. Metabolic syndrome - pioglitazone (ACTOS) 30 MG tablet; Take 1 tablet (30 mg total) by mouth daily.  Dispense: 90 tablet; Refill: 1 - CBC with Differential/Platelet - CMP14+EGFR - Lipid panel (not fasting)  4. Postoperative hypothyroidism Labs to assess. - TSH  5. Vitamin D deficiency Labs to assess. - VITAMIN D 25 Hydroxy (Vit-D Deficiency, Fractures)  6. Golfers elbow of left upper extremity Education provided on Golfer's Elbow. Patient is going to see if it continues to improve on it's own. Prednisone at pharmacy if he needs it. - predniSONE (STERAPRED UNI-PAK 21 TAB) 10 MG (21) TBPK tablet; As directed x 6 days  Dispense: 21 tablet; Refill: 0  7. Immunization due - Varicella-zoster vaccine IM (Shingrix) - given in office.   Return in about 6 months (around 04/03/2021) for annual physical.  Hendricks Limes, MSN, APRN, FNP-C [facility]  Subjective:    Patient ID: Shawn Ochoa, male    DOB: 1950-09-12, 70 y.o.   MRN: [id number]  Patient Care Team: Loman Brooklyn, FNP as PCP - General (Family Medicine) [facility]   Chief Complaint:  Chief Complaint  Patient presents with   Hypertension   Hyperlipidemia    Check up of chronic medical conditions    Elbow Pain    Left x 1 month     HPI: Shawn Ochoa is a 70 y.o. male presenting on 10/02/2020 for Hypertension, Hyperlipidemia (Check up of chronic medical conditions ), and Elbow Pain (Left x 1 month )  Patient here for routine follow-up.   Takes pioglitazone because of prediabetes and has  been for years.    New complaints: Patient c/o left elbow pain x1 month. Describes pain as sharp, burning, and aching, although the sharp pain has resolved. Pain radiates down his forearm. Feels he has weakness and loss of strength when using that elbow. States his elbow is misaligned due to a prior elbow break 20 years ago. It has improved since he stopped carrying stuff with that arm.   Social history:  Relevant past medical, surgical, family and social history reviewed and updated as indicated. Interim medical history since our last visit reviewed.  Allergies and medications reviewed and updated.  DATA REVIEWED: CHART IN EPIC  ROS: Negative unless specifically indicated above in HPI.    Current Outpatient Medications:    albuterol (VENTOLIN HFA) 108 (90 Base) MCG/ACT inhaler, Inhale 2 puffs into the lungs every 6 (six) hours as needed for wheezing or shortness of breath., Disp: 18 g, Rfl: 1   aspirin 81 MG EC tablet, Take 81 mg by mouth daily.  , Disp: , Rfl:    atorvastatin (LIPITOR) 20 MG tablet, Take 1 tablet (20 mg total) by mouth daily. (NEEDS TO BE SEEN BEFORE NEXT REFILL), Disp: 30 tablet, Rfl: 1   benazepril (LOTENSIN) 20 MG tablet, Take 1 tablet (20 mg total) by mouth daily., Disp: 30 tablet, Rfl: 0   Calcium Carbonate-Vitamin D (CALCIUM + D PO), Take 1 capsule by mouth daily.  , Disp: , Rfl:    Cholecalciferol (VITAMIN D3) 5000 UNITS CAPS, Take 1 capsule by  mouth daily.  , Disp: , Rfl:    DENTAGEL 1.1 % GEL dental gel, PLEASE SEE ATTACHED FOR DETAILED DIRECTIONS, Disp: , Rfl:    fluticasone (FLONASE) 50 MCG/ACT nasal spray, Place 1 spray into both nostrils daily. (NEEDS TO BE SEEN BEFORE NEXT REFILL), Disp: 16 mL, Rfl: 0   levothyroxine (SYNTHROID) 75 MCG tablet, Take 1 tablet (75 mcg total) by mouth daily., Disp: 90 tablet, Rfl: 1   Omega-3 Fatty Acids (FISH OIL) 1000 MG CAPS, Take 1 capsule by mouth 2 (two) times daily.  , Disp: , Rfl:    pioglitazone (ACTOS) 30 MG tablet,  Take 1 tablet (30 mg total) by mouth daily. (NEEDS TO BE SEEN BEFORE NEXT REFILL), Disp: 30 tablet, Rfl: 0   Allergies  Allergen Reactions   Mobic [Meloxicam] Other (See Comments)    Rectal bleeding with only 2 pills    Past Medical History:  Diagnosis Date   Abnormal glucose    Adenomatous polyp    Cardiac dysrhythmia, unspecified    Cataract    Essential hypertension, benign    Hemorrhage of gastrointestinal tract, unspecified    Metabolic syndrome    Nontoxic multinodular goiter    Other and unspecified hyperlipidemia    Prostatism     Past Surgical History:  Procedure Laterality Date   CATARACT EXTRACTION     KNEE CARTILAGE SURGERY Right    THYROIDECTOMY      Social History   Socioeconomic History   Marital status: Married    Spouse name: Hilda Blades   Number of children: 3   Years of education: Not on file   Highest education level: Professional school degree (e.g., MD, DDS, DVM, JD)  Occupational History   Not on file  Tobacco Use   Smoking status: Never   Smokeless tobacco: Never  Vaping Use   Vaping Use: Never used  Substance and Sexual Activity   Alcohol use: Yes    Comment: rare   Drug use: No   Sexual activity: Not Currently  Other Topics Concern   Not on file  Social History Narrative   Lives home with wife; children live in [place]    Social Determinants of Health   Financial Resource Strain: Low Risk    Difficulty of Paying Living Expenses: Not hard at all  Food Insecurity: No Food Insecurity   Worried About Charity fundraiser in the Last Year: Never true   Ran Out of Food in the Last Year: Never true  Transportation Needs: No Transportation Needs   Lack of Transportation (Medical): No   Lack of Transportation (Non-Medical): No  Physical Activity: Sufficiently Active   Days of Exercise per Week: 7 days   Minutes of Exercise per Session: 60 min  Stress: No Stress Concern Present   Feeling of Stress : Not at all  Social Connections: Socially  Integrated   Frequency of Communication with Friends and Family: Twice a week   Frequency of Social Gatherings with Friends and Family: Twice a week   Attends Religious Services: More than 4 times per year   Active Member of [employer] or Organizations: Yes   Attends Music therapist: More than 4 times per year   Marital Status: Married  Human resources officer Violence: Not At Risk   Fear of Current or Ex-Partner: No   Emotionally Abused: No   Physically Abused: No   Sexually Abused: No        Objective:    BP 124/66   Pulse  66   Temp 97.7 F (36.5 C) (Temporal)   Ht _0  (1.778 m)   Wt 226 lb 12.8 oz (102.9 kg)   SpO2 97%   BMI 32.54 kg/m   Wt Readings from Last 3 Encounters:  10/02/20 226 lb 12.8 oz (102.9 kg)  09/27/20 224 lb (101.6 kg)  01/20/20 220 lb 9.6 oz (100.1 kg)    Physical Exam Vitals reviewed.  Constitutional:      General: He is not in acute distress.    Appearance: Normal appearance. He is obese. He is not ill-appearing, toxic-appearing or diaphoretic.  HENT:     Head: Normocephalic and atraumatic.     Right Ear: Tympanic membrane, ear canal and external ear normal. There is no impacted cerumen.     Left Ear: Tympanic membrane, ear canal and external ear normal. There is no impacted cerumen.     Nose: Nose normal. No congestion or rhinorrhea.     Mouth/Throat:     Mouth: Mucous membranes are moist.     Pharynx: Oropharynx is clear. No oropharyngeal exudate or posterior oropharyngeal erythema.  Eyes:     General: No scleral icterus.       Right eye: No discharge.        Left eye: No discharge.     Conjunctiva/sclera: Conjunctivae normal.     Pupils: Pupils are equal, round, and reactive to light.  Neck:     Vascular: No carotid bruit.  Cardiovascular:     Rate and Rhythm: Normal rate and regular rhythm.     Heart sounds: Normal heart sounds. No murmur heard.   No friction rub. No gallop.  Pulmonary:     Effort: Pulmonary effort is normal.  No respiratory distress.     Breath sounds: Normal breath sounds. No stridor. No wheezing, rhonchi or rales.  Abdominal:     General: Abdomen is flat. Bowel sounds are normal. There is no distension.     Palpations: Abdomen is soft. There is no mass.     Tenderness: There is no abdominal tenderness. There is no guarding or rebound.     Hernia: No hernia is present.  Musculoskeletal:        General: Normal range of motion.     Left elbow: No swelling, deformity, effusion or lacerations. Normal range of motion. Tenderness present in medial epicondyle.     Cervical back: Normal range of motion and neck supple. No rigidity. No muscular tenderness.     Right lower leg: No edema.     Left lower leg: No edema.  Lymphadenopathy:     Cervical: No cervical adenopathy.  Skin:    General: Skin is warm and dry.     Capillary Refill: Capillary refill takes less than 2 seconds.  Neurological:     General: No focal deficit present.     Mental Status: He is alert and oriented to person, place, and time. Mental status is at baseline.  Psychiatric:        Mood and Affect: Mood normal.        Behavior: Behavior normal.        Thought Content: Thought content normal.        Judgment: Judgment normal.    Lab Results  Component Value Date   TSH 1.210 01/20/2020   Lab Results  Component Value Date   WBC 4.2 07/21/2019   HGB 13.5 07/21/2019   HCT 40.8 07/21/2019   MCV 95 07/21/2019   PLT 258 07/21/2019  Lab Results  Component Value Date   NA 140 01/20/2020   K 4.3 01/20/2020   CO2 24 01/20/2020   GLUCOSE 95 01/20/2020   BUN 23 01/20/2020   CREATININE 1.08 01/20/2020   BILITOT 0.6 01/20/2020   ALKPHOS 53 01/20/2020   AST 25 01/20/2020   ALT 24 01/20/2020   PROT 6.4 01/20/2020   ALBUMIN 4.1 01/20/2020   CALCIUM 9.3 01/20/2020   Lab Results  Component Value Date   CHOL 150 01/20/2020   Lab Results  Component Value Date   HDL 57 01/20/2020   Lab Results  Component Value Date    LDLCALC 83 01/20/2020   Lab Results  Component Value Date   TRIG 48 01/20/2020   Lab Results  Component Value Date   CHOLHDL 2.6 01/20/2020   Lab Results  Component Value Date   HGBA1C 5.4 01/20/2020

## 2020-10-03 LAB — CMP14+EGFR
ALT: 22 IU/L (ref 0–44)
AST: 24 IU/L (ref 0–40)
Albumin/Globulin Ratio: 1.3 (ref 1.2–2.2)
Albumin: 3.6 g/dL — ABNORMAL LOW (ref 3.7–4.7)
Alkaline Phosphatase: 54 IU/L (ref 44–121)
BUN/Creatinine Ratio: 18 (ref 10–24)
BUN: 21 mg/dL (ref 8–27)
Bilirubin Total: 0.5 mg/dL (ref 0.0–1.2)
CO2: 21 mmol/L (ref 20–29)
Calcium: 9.2 mg/dL (ref 8.6–10.2)
Chloride: 107 mmol/L — ABNORMAL HIGH (ref 96–106)
Creatinine, Ser: 1.17 mg/dL (ref 0.76–1.27)
Globulin, Total: 2.7 g/dL (ref 1.5–4.5)
Glucose: 95 mg/dL (ref 65–99)
Potassium: 3.9 mmol/L (ref 3.5–5.2)
Sodium: 143 mmol/L (ref 134–144)
Total Protein: 6.3 g/dL (ref 6.0–8.5)
eGFR: 67 mL/min/{1.73_m2} (ref >59)

## 2020-10-03 LAB — CBC WITH DIFFERENTIAL/PLATELET
Basophils Absolute: 0 10*3/uL (ref 0.0–0.2)
Basos: 1 %
EOS (ABSOLUTE): 0.1 10*3/uL (ref 0.0–0.4)
Eos: 3 %
Hematocrit: 38.7 % (ref 37.5–51.0)
Hemoglobin: 13 g/dL (ref 13.0–17.7)
Immature Grans (Abs): 0 10*3/uL (ref 0.0–0.1)
Immature Granulocytes: 1 %
Lymphocytes Absolute: 1.3 10*3/uL (ref 0.7–3.1)
Lymphs: 31 %
MCH: 31.3 pg (ref 26.6–33.0)
MCHC: 33.6 g/dL (ref 31.5–35.7)
MCV: 93 fL (ref 79–97)
Monocytes Absolute: 0.5 10*3/uL (ref 0.1–0.9)
Monocytes: 12 %
Neutrophils Absolute: 2.1 10*3/uL (ref 1.4–7.0)
Neutrophils: 52 %
Platelets: 254 10*3/uL (ref 150–450)
RBC: 4.15 x10E6/uL (ref 4.14–5.80)
RDW: 12.3 % (ref 11.6–15.4)
WBC: 4 10*3/uL (ref 3.4–10.8)

## 2020-10-03 LAB — LIPID PANEL
Chol/HDL Ratio: 2.6 ratio (ref 0.0–5.0)
Cholesterol, Total: 145 mg/dL (ref 100–199)
HDL: 55 mg/dL (ref >39)
LDL Chol Calc (NIH): 78 mg/dL (ref 0–99)
Triglycerides: 58 mg/dL (ref 0–149)
VLDL Cholesterol Cal: 12 mg/dL (ref 5–40)

## 2020-10-03 LAB — VITAMIN D 25 HYDROXY (VIT D DEFICIENCY, FRACTURES): Vit D, 25-Hydroxy: 66.1 ng/mL (ref 30.0–100.0)

## 2020-10-03 LAB — TSH: TSH: 1.23 u[IU]/mL (ref 0.450–4.500)

## 2020-10-13 ENCOUNTER — Other Ambulatory Visit: Payer: Self-pay | Admitting: Family Medicine

## 2020-10-13 DIAGNOSIS — J301 Allergic rhinitis due to pollen: Secondary | ICD-10-CM

## 2020-11-01 ENCOUNTER — Ambulatory Visit: Payer: Medicare HMO | Admitting: Urology

## 2020-11-02 ENCOUNTER — Ambulatory Visit: Payer: Medicare HMO | Admitting: Urology

## 2020-11-08 ENCOUNTER — Ambulatory Visit (INDEPENDENT_AMBULATORY_CARE_PROVIDER_SITE_OTHER): Payer: Medicare HMO | Admitting: Urology

## 2020-11-08 ENCOUNTER — Encounter: Payer: Self-pay | Admitting: Urology

## 2020-11-08 ENCOUNTER — Other Ambulatory Visit: Payer: Self-pay

## 2020-11-08 VITALS — BP 121/66 | HR 68

## 2020-11-08 DIAGNOSIS — N138 Other obstructive and reflux uropathy: Secondary | ICD-10-CM

## 2020-11-08 DIAGNOSIS — N401 Enlarged prostate with lower urinary tract symptoms: Secondary | ICD-10-CM | POA: Diagnosis not present

## 2020-11-08 DIAGNOSIS — R972 Elevated prostate specific antigen [PSA]: Secondary | ICD-10-CM | POA: Diagnosis not present

## 2020-11-08 DIAGNOSIS — R3912 Poor urinary stream: Secondary | ICD-10-CM | POA: Diagnosis not present

## 2020-11-08 DIAGNOSIS — R351 Nocturia: Secondary | ICD-10-CM | POA: Diagnosis not present

## 2020-11-08 DIAGNOSIS — Z87440 Personal history of urinary (tract) infections: Secondary | ICD-10-CM

## 2020-11-08 LAB — URINALYSIS, ROUTINE W REFLEX MICROSCOPIC
Bilirubin, UA: NEGATIVE
Glucose, UA: NEGATIVE
Ketones, UA: NEGATIVE
Leukocytes,UA: NEGATIVE
Nitrite, UA: NEGATIVE
Protein,UA: NEGATIVE
RBC, UA: NEGATIVE
Specific Gravity, UA: 1.02 (ref 1.005–1.030)
Urobilinogen, Ur: 0.2 mg/dL (ref 0.2–1.0)
pH, UA: 7 (ref 5.0–7.5)

## 2020-11-08 NOTE — Progress Notes (Signed)
Subjective:  1. Elevated PSA   2. BPH with urinary obstruction   3. Weak urinary stream   4. Nocturia   5. Personal history of urinary infection      CC: Elevated PSA.    HPI: Shawn Ochoa is a 70 year-old male established patient who is here for an elevated PSA.  11/08/20: His most recent PSA was  4.4 with a 22.3% f/t ratio on 01/20/20.  It was 2.3 on 01/26/19.  His UA is clear.  He is voiding with stable LUTS and an IPSS of 11.  He has nocturia x 3 with some frequency and an occasional reduced stream.     GU Hx: Shawn Ochoa returns today in f/u for his history of an elevated PSA that was up to 11.1 in early October 2018.  His PSA has returned to baseline and was 2.3 in 10/20.  He was felt to have had prostatitis responsible for the elevation. I last saw him in 2012 at which time he had a history of an elevated PSA with prostatitits in 2008.Marland Kitchen His PSA prior to his 2012 visit was down to 1.6. His PSA had been as high as 12. He has had 2 negative biopsies with the last in 2009 and a negative PCA3. He has had no dysuria or hematuria. His IPSS is 10 which is is stable.  He has nocturia x 3 and a reduced stream.  He has occasional urgency.   His UA is unremarkable today.  He has no other associated signs or symptoms.       IPSS     Row Name 11/08/20 1000         International Prostate Symptom Score   How often have you had the sensation of not emptying your bladder? Less than 1 in 5     How often have you had to urinate less than every two hours? About half the time     How often have you found you stopped and started again several times when you urinated? Less than 1 in 5 times     How often have you found it difficult to postpone urination? Less than 1 in 5 times     How often have you had a weak urinary stream? Less than half the time     How often have you had to strain to start urination? Not at All     How many times did you typically get up at night to urinate? 3 Times     Total  IPSS Score 11           Quality of Life due to urinary symptoms     If you were to spend the rest of your life with your urinary condition just the way it is now how would you feel about that? Mostly Satisfied               ROS:  ROS:  A complete review of systems was performed.  All systems are negative except for pertinent findings as noted.   ROS  Allergies  Allergen Reactions   Mobic [Meloxicam] Other (See Comments)    Rectal bleeding with only 2 pills     Outpatient Encounter Medications as of 11/08/2020  Medication Sig   albuterol (VENTOLIN HFA) 108 (90 Base) MCG/ACT inhaler Inhale 2 puffs into the lungs every 6 (six) hours as needed for wheezing or shortness of breath.   aspirin 81 MG EC tablet Take 81 mg by mouth daily.  atorvastatin (LIPITOR) 20 MG tablet Take 1 tablet (20 mg total) by mouth daily. (NEEDS TO BE SEEN BEFORE NEXT REFILL)   benazepril (LOTENSIN) 20 MG tablet Take 1 tablet (20 mg total) by mouth daily.   Calcium Carbonate-Vitamin D (CALCIUM + D PO) Take 1 capsule by mouth daily.     Cholecalciferol (VITAMIN D3) 5000 UNITS CAPS Take 1 capsule by mouth daily.     DENTAGEL 1.1 % GEL dental gel PLEASE SEE ATTACHED FOR DETAILED DIRECTIONS   fluticasone (FLONASE) 50 MCG/ACT nasal spray PLACE 1 SPRAY INTO BOTH NOSTRILS DAILY. (NEEDS TO BE SEEN BEFORE NEXT REFILL)   levothyroxine (SYNTHROID) 75 MCG tablet Take 1 tablet (75 mcg total) by mouth daily.   Omega-3 Fatty Acids (FISH OIL) 1000 MG CAPS Take 1 capsule by mouth 2 (two) times daily.     pioglitazone (ACTOS) 30 MG tablet Take 1 tablet (30 mg total) by mouth daily.   predniSONE (STERAPRED UNI-PAK 21 TAB) 10 MG (21) TBPK tablet As directed x 6 days   No facility-administered encounter medications on file as of 11/08/2020.    Past Medical History:  Diagnosis Date   Abnormal glucose    Adenomatous polyp    Cardiac dysrhythmia, unspecified    Cataract    Essential hypertension, benign    Hemorrhage of  gastrointestinal tract, unspecified    Metabolic syndrome    Nontoxic multinodular goiter    Other and unspecified hyperlipidemia    Prostatism     Past Surgical History:  Procedure Laterality Date   CATARACT EXTRACTION     KNEE CARTILAGE SURGERY Right    THYROIDECTOMY      Social History   Socioeconomic History   Marital status: Married    Spouse name: Hilda Blades   Number of children: 3   Years of education: Not on file   Highest education level: Professional school degree (e.g., MD, DDS, DVM, JD)  Occupational History   Not on file  Tobacco Use   Smoking status: Never   Smokeless tobacco: Never  Vaping Use   Vaping Use: Never used  Substance and Sexual Activity   Alcohol use: Yes    Comment: rare   Drug use: No   Sexual activity: Not Currently  Other Topics Concern   Not on file  Social History Narrative   Lives home with wife; children live in [place]    Social Determinants of Health   Financial Resource Strain: Low Risk    Difficulty of Paying Living Expenses: Not hard at all  Food Insecurity: No Food Insecurity   Worried About Charity fundraiser in the Last Year: Never true   [place] in the Last Year: Never true  Transportation Needs: No Transportation Needs   Lack of Transportation (Medical): No   Lack of Transportation (Non-Medical): No  Physical Activity: Sufficiently Active   Days of Exercise per Week: 7 days   Minutes of Exercise per Session: 60 min  Stress: No Stress Concern Present   Feeling of Stress : Not at all  Social Connections: Socially Integrated   Frequency of Communication with Friends and Family: Twice a week   Frequency of Social Gatherings with Friends and Family: Twice a week   Attends Religious Services: More than 4 times per year   Active Member of [employer] or Organizations: Yes   Attends Music therapist: More than 4 times per year   Marital Status: Married  Human resources officer Violence: Not At Risk  Fear of  Current or Ex-Partner: No   Emotionally Abused: No   Physically Abused: No   Sexually Abused: No    Family History  Problem Relation Age of Onset   Diabetes Mother    Hypertension Mother    Heart disease Mother    Stroke Father    Hypertension Father    Colon cancer Maternal Uncle 71   Colon cancer Other 20       Objective: Vitals:   11/08/20 1027  BP: 121/66  Pulse: 68     Physical Exam Vitals reviewed.  Constitutional:      Appearance: Normal appearance.  Genitourinary:    Comments: AP normal. NST without mass. Prostate 3+ benign. SV's non-palpable.  Neurological:     Mental Status: He is alert.    Lab Results:  Results for orders placed or performed in visit on 11/08/20 (from the past 24 hour(s))  Urinalysis, Routine w reflex microscopic     Status: None   Collection Time: 11/08/20 10:28 AM  Result Value Ref Range   Specific Gravity, UA 1.020 1.005 - 1.030   pH, UA 7.0 5.0 - 7.5   Color, UA Yellow Yellow   Appearance Ur Clear Clear   Leukocytes,UA Negative Negative   Protein,UA Negative Negative/Trace   Glucose, UA Negative Negative   Ketones, UA Negative Negative   RBC, UA Negative Negative   Bilirubin, UA Negative Negative   Urobilinogen, Ur 0.2 0.2 - 1.0 mg/dL   Nitrite, UA Negative Negative   Microscopic Examination Comment    Narrative   Performed at:  [facility] [address], [place], [place]  [id number] Lab Director: [place], Phone:  [phone]     BMET No results for input(s): NA, K, CL, CO2, GLUCOSE, BUN, CREATININE, CALCIUM in the last 72 hours. PSA Lab Results  Component Value Date   PSA1 4.4 (H) 01/20/2020     PSA was 2.3 in 10/21  PSA  Date Value Ref Range Status  11/07/2013 2.2 0.0 - 4.0 ng/mL Final    Comment:    Roche ECLIA methodology. According to the American Urological Association, Serum PSA should decrease and remain at undetectable levels after radical prostatectomy. The AUA defines  biochemical recurrence as an initial PSA value 0.2 ng/mL or greater followed by a subsequent confirmatory PSA value 0.2 ng/mL or greater. Values obtained with different assay methods or kits cannot be used interchangeably. Results cannot be interpreted as absolute evidence of the presence or absence of malignant disease.   No results found for: TESTOSTERONE    Studies/Results: No results found.    Assessment & Plan:  Elevated PSA.  His PSA was up a bit in October but below his prior highs.  It has been variable in the past.  I will repeat the PSA today and prior to f/u in a year.  BPH with BOO.  He has stable moderate LUTS and an enlarged benign prostate on exam.   No treatment indicated at this time.    History of UTI.  His UA is clear today.   No orders of the defined types were placed in this encounter.    Orders Placed This Encounter  Procedures   Urinalysis, Routine w reflex microscopic   PSA, total and free   PSA, total and free    Standing Status:   Future    Standing Expiration Date:   11/08/2021      Return in about 1 year (around 11/08/2021) for  PSA today and prior to f/u. Marland Kitchen   CC: Loman Brooklyn, FNP      Irine Seal 11/08/2020

## 2020-11-08 NOTE — Progress Notes (Signed)
Urological Symptom Review  Patient is experiencing the following symptoms: Frequent urination Hard to postpone urination Get up at night to urinate Leakage of urine Weak stream   Review of Systems  Gastrointestinal (upper)  : Negative for upper GI symptoms  Gastrointestinal (lower) : Negative for lower GI symptoms  Constitutional : Negative for symptoms  Skin: Negative for skin symptoms  Eyes: Negative for eye symptoms  Ear/Nose/Throat : Negative for Ear/Nose/Throat symptoms  Hematologic/Lymphatic: Negative for Hematologic/Lymphatic symptoms  Cardiovascular : Negative for cardiovascular symptoms  Respiratory : Negative for respiratory symptoms  Endocrine: Negative for endocrine symptoms  Musculoskeletal: Negative for musculoskeletal symptoms  Neurological: Negative for neurological symptoms  Psychologic: Negative for psychiatric symptoms

## 2020-11-09 LAB — PSA, TOTAL AND FREE
PSA, Free Pct: 22.5 %
PSA, Free: 1.08 ng/mL
Prostate Specific Ag, Serum: 4.8 ng/mL — ABNORMAL HIGH (ref 0.0–4.0)

## 2020-11-09 NOTE — Progress Notes (Signed)
Sent via mychart

## 2020-11-23 ENCOUNTER — Other Ambulatory Visit: Payer: Self-pay | Admitting: Family Medicine

## 2020-11-23 DIAGNOSIS — E782 Mixed hyperlipidemia: Secondary | ICD-10-CM

## 2021-01-18 ENCOUNTER — Other Ambulatory Visit: Payer: Self-pay | Admitting: Family Medicine

## 2021-01-18 DIAGNOSIS — E890 Postprocedural hypothyroidism: Secondary | ICD-10-CM

## 2021-02-22 ENCOUNTER — Other Ambulatory Visit: Payer: Self-pay | Admitting: Family Medicine

## 2021-02-22 DIAGNOSIS — I10 Essential (primary) hypertension: Secondary | ICD-10-CM

## 2021-02-22 DIAGNOSIS — J301 Allergic rhinitis due to pollen: Secondary | ICD-10-CM

## 2021-02-22 DIAGNOSIS — E782 Mixed hyperlipidemia: Secondary | ICD-10-CM

## 2021-03-05 ENCOUNTER — Other Ambulatory Visit: Payer: Self-pay

## 2021-03-05 ENCOUNTER — Ambulatory Visit (INDEPENDENT_AMBULATORY_CARE_PROVIDER_SITE_OTHER): Payer: Medicare HMO

## 2021-03-05 DIAGNOSIS — Z23 Encounter for immunization: Secondary | ICD-10-CM

## 2021-03-24 ENCOUNTER — Other Ambulatory Visit: Payer: Self-pay | Admitting: Family Medicine

## 2021-03-24 DIAGNOSIS — E782 Mixed hyperlipidemia: Secondary | ICD-10-CM

## 2021-03-25 NOTE — Telephone Encounter (Signed)
Going to call back to make appt. He is driving and needs to look at his schedule.

## 2021-03-25 NOTE — Telephone Encounter (Signed)
Patient coming in 12-20 to see [place] for med refills.

## 2021-03-25 NOTE — Telephone Encounter (Signed)
Shawn Ochoa. NTBS 30 days given 02/25/21

## 2021-04-02 ENCOUNTER — Encounter: Payer: Self-pay | Admitting: Family Medicine

## 2021-04-02 ENCOUNTER — Ambulatory Visit: Payer: Medicare HMO | Admitting: Family Medicine

## 2021-04-02 VITALS — BP 139/74 | HR 72 | Temp 97.9°F | Ht 70.0 in | Wt 226.0 lb

## 2021-04-02 DIAGNOSIS — E890 Postprocedural hypothyroidism: Secondary | ICD-10-CM | POA: Diagnosis not present

## 2021-04-02 DIAGNOSIS — J301 Allergic rhinitis due to pollen: Secondary | ICD-10-CM

## 2021-04-02 DIAGNOSIS — I10 Essential (primary) hypertension: Secondary | ICD-10-CM | POA: Diagnosis not present

## 2021-04-02 DIAGNOSIS — E8881 Metabolic syndrome: Secondary | ICD-10-CM

## 2021-04-02 DIAGNOSIS — Z23 Encounter for immunization: Secondary | ICD-10-CM

## 2021-04-02 DIAGNOSIS — E782 Mixed hyperlipidemia: Secondary | ICD-10-CM

## 2021-04-02 DIAGNOSIS — N401 Enlarged prostate with lower urinary tract symptoms: Secondary | ICD-10-CM

## 2021-04-02 DIAGNOSIS — E559 Vitamin D deficiency, unspecified: Secondary | ICD-10-CM

## 2021-04-02 DIAGNOSIS — N138 Other obstructive and reflux uropathy: Secondary | ICD-10-CM

## 2021-04-02 LAB — BAYER DCA HB A1C WAIVED: HB A1C (BAYER DCA - WAIVED): 5.3 % (ref 4.8–5.6)

## 2021-04-02 MED ORDER — FLUTICASONE PROPIONATE 50 MCG/ACT NA SUSP: 1.0000 | Freq: Every day | NASAL | 5 refills | Status: DC

## 2021-04-02 MED ORDER — ATORVASTATIN CALCIUM 20 MG PO TABS: 20.0000 mg | ORAL_TABLET | Freq: Every day | ORAL | 1 refills | Status: DC

## 2021-04-02 MED ORDER — BENAZEPRIL HCL 20 MG PO TABS: 20.0000 mg | ORAL_TABLET | Freq: Every day | ORAL | 1 refills | Status: DC

## 2021-04-02 NOTE — Progress Notes (Signed)
Assessment & Plan:  1. Essential hypertension Well controlled on current regimen.  - benazepril (LOTENSIN) 20 MG tablet; Take 1 tablet (20 mg total) by mouth daily.  Dispense: 90 tablet; Refill: 1 - CBC with Differential/Platelet - CMP14+EGFR - Lipid panel  2. Mixed hyperlipidemia Labs to assess. - atorvastatin (LIPITOR) 20 MG tablet; Take 1 tablet (20 mg total) by mouth daily.  Dispense: 90 tablet; Refill: 1 - CMP14+EGFR - Lipid panel  3. Metabolic syndrome D/C Actos.  We will reassess A1c in 3 months. - CBC with Differential/Platelet - CMP14+EGFR - Lipid panel - Bayer DCA Hb A1c Waived  4. Postoperative hypothyroidism Well controlled on current regimen.  - TSH - T4, free  5. BPH with urinary obstruction Well controlled on current regimen.  Follow-up with urology next year as directed by them.  6. Vitamin D deficiency Labs to assess. - VITAMIN D 25 Hydroxy (Vit-D Deficiency, Fractures)  7. Seasonal allergic rhinitis due to pollen Well controlled on current regimen.  - fluticasone (FLONASE) 50 MCG/ACT nasal spray; Place 1 spray into both nostrils daily.  Dispense: 16 mL; Refill: 5  8. Immunization due Shingrix given in office today.   Return in about 3 months (around 07/01/2021) for annual physical.  Hendricks Limes, MSN, APRN, FNP-C [facility]  Subjective:    Patient ID: Demorio Seeley, male    DOB: 1951-03-19, 70 y.o.   MRN: [id number]  Patient Care Team: Loman Brooklyn, FNP as PCP - General (Family Medicine) [facility]   Chief Complaint:  Chief Complaint  Patient presents with   Hypertension    Check up of chronic medical conditions     HPI: Vada Yellen is a 70 y.o. male presenting on 04/02/2021 for Hypertension (Check up of chronic medical conditions )  Hypertension: patient does monitor blood pressure at home and reports readings 120s/70s. He does not add salt to foods, but does not  chose low salt foods. He is walking 1-2 miles per day since the weather turned cold; previously was walking 3 miles during the warmer months.   Hyperlipidemia: taking fish oil and atorvastatin.  The 10-year ASCVD risk score (Arnett DK, et al., 2019) is: 22.9%   Values used to calculate the score:     Age: 59 years     Sex: Male     Is Non-Hispanic African American: No     Diabetic: No     Tobacco smoker: No     Systolic Blood Pressure: 803 mmHg     Is BP treated: Yes     HDL Cholesterol: 55 mg/dL     Total Cholesterol: 145 mg/dL  Prediabetes: has been taking Actos for years per his report.  BPH: last seen by urology, Dr. Jeffie Pollock, on 11/08/2020 with expected follow-up in one year.  Hypothyroidism: current dose of levothyroxine 75 mcg.   New complaints: None   Social history:  Relevant past medical, surgical, family and social history reviewed and updated as indicated. Interim medical history since our last visit reviewed.  Allergies and medications reviewed and updated.  DATA REVIEWED: CHART IN EPIC  ROS: Negative unless specifically indicated above in HPI.    Current Outpatient Medications:    aspirin 81 MG EC tablet, Take 81 mg by mouth daily.  , Disp: , Rfl:    atorvastatin (LIPITOR) 20 MG tablet, Take 1 tablet (20 mg total) by mouth daily. (NEEDS TO BE SEEN BEFORE NEXT REFILL), Disp: 30 tablet, Rfl: 0  benazepril (LOTENSIN) 20 MG tablet, Take 1 tablet (20 mg total) by mouth daily. (NEEDS TO BE SEEN BEFORE NEXT REFILL), Disp: 30 tablet, Rfl: 0   Calcium Carbonate-Vitamin D (CALCIUM + D PO), Take 1 capsule by mouth daily.  , Disp: , Rfl:    Cholecalciferol (VITAMIN D3) 5000 UNITS CAPS, Take 1 capsule by mouth daily.  , Disp: , Rfl:    DENTAGEL 1.1 % GEL dental gel, PLEASE SEE ATTACHED FOR DETAILED DIRECTIONS, Disp: , Rfl:    fluticasone (FLONASE) 50 MCG/ACT nasal spray, Place 1 spray into both nostrils daily., Disp: 48 mL, Rfl: 0   levothyroxine (SYNTHROID) 75 MCG tablet,  TAKE 1 TABLET BY MOUTH EVERY DAY, Disp: 90 tablet, Rfl: 2   Omega-3 Fatty Acids (FISH OIL) 1000 MG CAPS, Take 1 capsule by mouth 2 (two) times daily.  , Disp: , Rfl:    pioglitazone (ACTOS) 30 MG tablet, Take 1 tablet (30 mg total) by mouth daily., Disp: 90 tablet, Rfl: 1   albuterol (VENTOLIN HFA) 108 (90 Base) MCG/ACT inhaler, Inhale 2 puffs into the lungs every 6 (six) hours as needed for wheezing or shortness of breath. (Patient not taking: Reported on 04/02/2021), Disp: 18 g, Rfl: 1   Allergies  Allergen Reactions   Mobic [Meloxicam] Other (See Comments)    Rectal bleeding with only 2 pills    Past Medical History:  Diagnosis Date   Abnormal glucose    Adenomatous polyp    Cardiac dysrhythmia, unspecified    Cataract    Essential hypertension, benign    Hemorrhage of gastrointestinal tract, unspecified    Metabolic syndrome    Nontoxic multinodular goiter    Other and unspecified hyperlipidemia    Prostatism     Past Surgical History:  Procedure Laterality Date   CATARACT EXTRACTION     KNEE CARTILAGE SURGERY Right    THYROIDECTOMY      Social History   Socioeconomic History   Marital status: Married    Spouse name: Stanton Kidney   Number of children: 3   Years of education: Not on file   Highest education level: Professional school degree (e.g., MD, DDS, DVM, JD)  Occupational History   Not on file  Tobacco Use   Smoking status: Never   Smokeless tobacco: Never  Vaping Use   Vaping Use: Never used  Substance and Sexual Activity   Alcohol use: Yes    Comment: rare   Drug use: No   Sexual activity: Not Currently  Other Topics Concern   Not on file  Social History Narrative   Lives home with wife; children live in [place]    Social Determinants of Health   Financial Resource Strain: Low Risk    Difficulty of Paying Living Expenses: Not hard at all  Food Insecurity: No Food Insecurity   Worried About Programme researcher, broadcasting/film/video in the Last Year: Never true   Ran Out  of Food in the Last Year: Never true  Transportation Needs: No Transportation Needs   Lack of Transportation (Medical): No   Lack of Transportation (Non-Medical): No  Physical Activity: Sufficiently Active   Days of Exercise per Week: 7 days   Minutes of Exercise per Session: 60 min  Stress: No Stress Concern Present   Feeling of Stress : Not at all  Social Connections: Socially Integrated   Frequency of Communication with Friends and Family: Twice a week   Frequency of Social Gatherings with Friends and Family: Twice a week  Attends Religious Services: More than 4 times per year   Active Member of Clubs or Organizations: Yes   Attends Archivist Meetings: More than 4 times per year   Marital Status: Married  Human resources officer Violence: Not At Risk   Fear of Current or Ex-Partner: No   Emotionally Abused: No   Physically Abused: No   Sexually Abused: No        Objective:    BP 139/74    Pulse 72    Temp 97.9 F (36.6 C) (Temporal)    Ht _0  (1.778 m)    Wt 226 lb (102.5 kg)    SpO2 98%    BMI 32.43 kg/m   Wt Readings from Last 3 Encounters:  04/02/21 226 lb (102.5 kg)  10/02/20 226 lb 12.8 oz (102.9 kg)  09/27/20 224 lb (101.6 kg)    Physical Exam Vitals reviewed.  Constitutional:      General: He is not in acute distress.    Appearance: Normal appearance. He is obese. He is not ill-appearing, toxic-appearing or diaphoretic.  HENT:     Head: Normocephalic and atraumatic.  Eyes:     General: No scleral icterus.       Right eye: No discharge.        Left eye: No discharge.     Conjunctiva/sclera: Conjunctivae normal.  Cardiovascular:     Rate and Rhythm: Normal rate and regular rhythm.     Heart sounds: Normal heart sounds. No murmur heard.   No friction rub. No gallop.  Pulmonary:     Effort: Pulmonary effort is normal. No respiratory distress.     Breath sounds: Normal breath sounds. No stridor. No wheezing, rhonchi or rales.  Musculoskeletal:         General: Normal range of motion.     Cervical back: Normal range of motion.  Skin:    General: Skin is warm and dry.  Neurological:     Mental Status: He is alert and oriented to person, place, and time. Mental status is at baseline.  Psychiatric:        Mood and Affect: Mood normal.        Behavior: Behavior normal.        Thought Content: Thought content normal.        Judgment: Judgment normal.    Lab Results  Component Value Date   TSH 1.230 10/02/2020   Lab Results  Component Value Date   WBC 4.0 10/02/2020   HGB 13.0 10/02/2020   HCT 38.7 10/02/2020   MCV 93 10/02/2020   PLT 254 10/02/2020   Lab Results  Component Value Date   NA 143 10/02/2020   K 3.9 10/02/2020   CO2 21 10/02/2020   GLUCOSE 95 10/02/2020   BUN 21 10/02/2020   CREATININE 1.17 10/02/2020   BILITOT 0.5 10/02/2020   ALKPHOS 54 10/02/2020   AST 24 10/02/2020   ALT 22 10/02/2020   PROT 6.3 10/02/2020   ALBUMIN 3.6 (L) 10/02/2020   CALCIUM 9.2 10/02/2020   EGFR 67 10/02/2020   Lab Results  Component Value Date   CHOL 145 10/02/2020   Lab Results  Component Value Date   HDL 55 10/02/2020   Lab Results  Component Value Date   LDLCALC 78 10/02/2020   Lab Results  Component Value Date   TRIG 58 10/02/2020   Lab Results  Component Value Date   CHOLHDL 2.6 10/02/2020   Lab Results  Component Value Date  HGBA1C 5.4 01/20/2020

## 2021-04-02 NOTE — Patient Instructions (Signed)
Stop taking Actos.

## 2021-04-03 LAB — CMP14+EGFR
ALT: 26 IU/L (ref 0–44)
AST: 21 IU/L (ref 0–40)
Albumin/Globulin Ratio: 1.7 (ref 1.2–2.2)
Albumin: 3.8 g/dL (ref 3.7–4.7)
Alkaline Phosphatase: 61 IU/L (ref 44–121)
BUN/Creatinine Ratio: 16 (ref 10–24)
BUN: 19 mg/dL (ref 8–27)
Bilirubin Total: 0.6 mg/dL (ref 0.0–1.2)
CO2: 25 mmol/L (ref 20–29)
Calcium: 9.5 mg/dL (ref 8.6–10.2)
Chloride: 106 mmol/L (ref 96–106)
Creatinine, Ser: 1.16 mg/dL (ref 0.76–1.27)
Globulin, Total: 2.2 g/dL (ref 1.5–4.5)
Glucose: 97 mg/dL (ref 70–99)
Potassium: 4.5 mmol/L (ref 3.5–5.2)
Sodium: 141 mmol/L (ref 134–144)
Total Protein: 6 g/dL (ref 6.0–8.5)
eGFR: 67 mL/min/{1.73_m2} (ref >59)

## 2021-04-03 LAB — T4, FREE: Free T4: 1.62 ng/dL (ref 0.82–1.77)

## 2021-04-03 LAB — CBC WITH DIFFERENTIAL/PLATELET
Basophils Absolute: 0 10*3/uL (ref 0.0–0.2)
Basos: 1 %
EOS (ABSOLUTE): 0.1 10*3/uL (ref 0.0–0.4)
Eos: 3 %
Hematocrit: 39.1 % (ref 37.5–51.0)
Hemoglobin: 13.3 g/dL (ref 13.0–17.7)
Immature Grans (Abs): 0 10*3/uL (ref 0.0–0.1)
Immature Granulocytes: 1 %
Lymphocytes Absolute: 1.2 10*3/uL (ref 0.7–3.1)
Lymphs: 31 %
MCH: 32 pg (ref 26.6–33.0)
MCHC: 34 g/dL (ref 31.5–35.7)
MCV: 94 fL (ref 79–97)
Monocytes Absolute: 0.5 10*3/uL (ref 0.1–0.9)
Monocytes: 11 %
Neutrophils Absolute: 2.1 10*3/uL (ref 1.4–7.0)
Neutrophils: 53 %
Platelets: 264 10*3/uL (ref 150–450)
RBC: 4.16 x10E6/uL (ref 4.14–5.80)
RDW: 12.2 % (ref 11.6–15.4)
WBC: 4 10*3/uL (ref 3.4–10.8)

## 2021-04-03 LAB — LIPID PANEL
Chol/HDL Ratio: 2.7 ratio (ref 0.0–5.0)
Cholesterol, Total: 153 mg/dL (ref 100–199)
HDL: 56 mg/dL (ref >39)
LDL Chol Calc (NIH): 84 mg/dL (ref 0–99)
Triglycerides: 63 mg/dL (ref 0–149)
VLDL Cholesterol Cal: 13 mg/dL (ref 5–40)

## 2021-04-03 LAB — TSH: TSH: 1.13 u[IU]/mL (ref 0.450–4.500)

## 2021-04-03 LAB — VITAMIN D 25 HYDROXY (VIT D DEFICIENCY, FRACTURES): Vit D, 25-Hydroxy: 56.7 ng/mL (ref 30.0–100.0)

## 2021-04-04 ENCOUNTER — Encounter: Payer: Self-pay | Admitting: Family Medicine

## 2021-04-13 ENCOUNTER — Other Ambulatory Visit: Payer: Self-pay | Admitting: Family Medicine

## 2021-04-13 DIAGNOSIS — E8881 Metabolic syndrome: Secondary | ICD-10-CM

## 2021-04-16 NOTE — Telephone Encounter (Signed)
Actos discontinued on 04/02/21 by provider and [facility] aware.

## 2021-04-16 NOTE — Addendum Note (Signed)
Addended by: Thana Ates on: 04/16/2021 08:37 AM   Modules accepted: Orders

## 2021-05-02 ENCOUNTER — Other Ambulatory Visit: Payer: Medicare HMO

## 2021-05-02 ENCOUNTER — Other Ambulatory Visit: Payer: Self-pay

## 2021-05-02 DIAGNOSIS — R972 Elevated prostate specific antigen [PSA]: Secondary | ICD-10-CM

## 2021-05-03 LAB — PSA, TOTAL AND FREE
PSA, Free Pct: 21.4 %
PSA, Free: 0.94 ng/mL
Prostate Specific Ag, Serum: 4.4 ng/mL — ABNORMAL HIGH (ref 0.0–4.0)

## 2021-07-04 ENCOUNTER — Encounter: Payer: Self-pay | Admitting: Family Medicine

## 2021-07-04 ENCOUNTER — Ambulatory Visit (INDEPENDENT_AMBULATORY_CARE_PROVIDER_SITE_OTHER): Payer: Medicare HMO | Admitting: Family Medicine

## 2021-07-04 VITALS — BP 127/77 | HR 69 | Temp 97.4°F | Ht 70.0 in | Wt 224.0 lb

## 2021-07-04 DIAGNOSIS — E782 Mixed hyperlipidemia: Secondary | ICD-10-CM | POA: Diagnosis not present

## 2021-07-04 DIAGNOSIS — Z0001 Encounter for general adult medical examination with abnormal findings: Secondary | ICD-10-CM | POA: Diagnosis not present

## 2021-07-04 DIAGNOSIS — Z0000 Encounter for general adult medical examination without abnormal findings: Secondary | ICD-10-CM

## 2021-07-04 DIAGNOSIS — E890 Postprocedural hypothyroidism: Secondary | ICD-10-CM | POA: Diagnosis not present

## 2021-07-04 DIAGNOSIS — I10 Essential (primary) hypertension: Secondary | ICD-10-CM | POA: Diagnosis not present

## 2021-07-04 DIAGNOSIS — E8881 Metabolic syndrome: Secondary | ICD-10-CM

## 2021-07-04 LAB — BAYER DCA HB A1C WAIVED: HB A1C (BAYER DCA - WAIVED): 5.8 % — ABNORMAL HIGH (ref 4.8–5.6)

## 2021-07-04 MED ORDER — LEVOTHYROXINE SODIUM 75 MCG PO TABS: 75.0000 ug | ORAL_TABLET | Freq: Every day | ORAL | 1 refills | Status: DC

## 2021-07-04 NOTE — Progress Notes (Signed)
Assessment & Plan:  1. Well adult exam Preventive health education provided. - CBC with Differential/Platelet - CMP14+EGFR - Lipid panel  2. Postoperative hypothyroidism Well controlled on current regimen.  - levothyroxine (SYNTHROID) 75 MCG tablet; Take 1 tablet (75 mcg total) by mouth daily.  Dispense: 90 tablet; Refill: 1 - TSH - T4, free  3. Essential hypertension Well controlled on current regimen.  - CBC with Differential/Platelet - CMP14+EGFR - Lipid panel  4. Mixed hyperlipidemia Well controlled on current regimen.  - CBC with Differential/Platelet - CMP14+EGFR - Lipid panel  5. Metabolic syndrome Labs to assess. - CBC with Differential/Platelet - CMP14+EGFR - Lipid panel - Bayer DCA Hb A1c Waived   Follow-up: Return in about 6 months (around 01/04/2022) for follow-up of chronic medication conditions.   Deliah Boston, MSN, APRN, FNP-C [facility]  Subjective:  Patient ID: Shawn Ochoa, male    DOB: [birth year]  Age: 71 y.o. MRN: [id number]  Patient Care Team: Gwenlyn Fudge, FNP as PCP - General (Family Medicine) [facility]   CC:  Chief Complaint  Patient presents with   Annual Exam    HPI Shawn Ochoa presents for his annual physical.  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, Marital status: married, Substance use: none Diet: regular, Exercise: walking 3 miles every day Last eye exam: within the past two years Last dental exam: regular visits Last colonoscopy: 01/02/2020 with recommended repeat in 5 years Hepatitis C Screening: negative on 01/06/2017 PSA: 4.4 in January 2023 Immunizations: Flu Vaccine: up to date Tdap Vaccine: up to date  Shingrix Vaccine: up to date  COVID-19 Vaccine: up to date Pneumonia Vaccine: up to date  Advanced Directives Patient does have advanced directives including living will, healthcare power of attorney, and financial power of attorney.  He does not have a copy in the electronic medical record.   DEPRESSION SCREENING    04/02/2021    8:56 AM 10/02/2020    8:51 AM 09/27/2020    9:55 AM 01/20/2020   10:33 AM 09/27/2019    9:30 AM 01/26/2019   12:28 PM 09/22/2018    8:38 AM  PHQ 2/9 Scores  PHQ - 2 Score 0 0 0 0 0 0 0  PHQ- 9 Score 0 0          Review of Systems  Constitutional:  Negative for chills, fever, malaise/fatigue and weight loss.  HENT:  Negative for congestion, ear discharge, ear pain, nosebleeds, sinus pain, sore throat and tinnitus.   Eyes:  Negative for blurred vision, double vision, pain, discharge and redness.  Respiratory:  Negative for cough, shortness of breath and wheezing.   Cardiovascular:  Negative for chest pain, palpitations and leg swelling.  Gastrointestinal:  Negative for abdominal pain, constipation, diarrhea, heartburn, nausea and vomiting.  Genitourinary:  Negative for dysuria, frequency and urgency.  Musculoskeletal:  Negative for myalgias.  Skin:  Negative for rash.  Neurological:  Negative for dizziness, seizures, weakness and headaches.  Psychiatric/Behavioral:  Negative for depression, substance abuse and suicidal ideas. The patient is not nervous/anxious.     Current Outpatient Medications:    aspirin 81 MG EC tablet, Take 81 mg by mouth daily.  , Disp: , Rfl:    atorvastatin (LIPITOR) 20 MG tablet, Take 1 tablet (20 mg total) by mouth daily., Disp: 90 tablet, Rfl: 1   benazepril (LOTENSIN) 20 MG tablet, Take 1 tablet (20 mg total) by mouth daily., Disp: 90 tablet, Rfl: 1  Calcium Carbonate-Vitamin D (CALCIUM + D PO), Take 1 capsule by mouth daily.  , Disp: , Rfl:    Cholecalciferol (VITAMIN D3) 5000 UNITS CAPS, Take 1 capsule by mouth daily.  , Disp: , Rfl:    DENTAGEL 1.1 % GEL dental gel, PLEASE SEE ATTACHED FOR DETAILED DIRECTIONS, Disp: , Rfl:    fluticasone (FLONASE) 50 MCG/ACT nasal spray, Place 1 spray into both nostrils daily., Disp: 16 mL, Rfl: 5   levothyroxine  (SYNTHROID) 75 MCG tablet, TAKE 1 TABLET BY MOUTH EVERY DAY, Disp: 90 tablet, Rfl: 2   Omega-3 Fatty Acids (FISH OIL) 1000 MG CAPS, Take 1 capsule by mouth 2 (two) times daily.  , Disp: , Rfl:   Allergies  Allergen Reactions   Mobic [Meloxicam] Other (See Comments)    Rectal bleeding with only 2 pills     Past Medical History:  Diagnosis Date   Abnormal glucose    Adenomatous polyp    Cardiac dysrhythmia, unspecified    Cataract    Essential hypertension, benign    Hemorrhage of gastrointestinal tract, unspecified    Metabolic syndrome    Nontoxic multinodular goiter    Other and unspecified hyperlipidemia    Prostatism     Past Surgical History:  Procedure Laterality Date   CATARACT EXTRACTION     KNEE CARTILAGE SURGERY Right    THYROIDECTOMY      Family History  Problem Relation Age of Onset   Diabetes Mother    Hypertension Mother    Heart disease Mother    Stroke Father    Hypertension Father    Colon cancer Maternal Uncle 95   Colon cancer Other 20    Social History   Socioeconomic History   Marital status: Married    Spouse name: Stanton Kidney   Number of children: 3   Years of education: Not on file   Highest education level: Professional school degree (e.g., MD, DDS, DVM, JD)  Occupational History   Not on file  Tobacco Use   Smoking status: Never   Smokeless tobacco: Never  Vaping Use   Vaping Use: Never used  Substance and Sexual Activity   Alcohol use: Yes    Comment: rare   Drug use: No   Sexual activity: Not Currently  Other Topics Concern   Not on file  Social History Narrative   Lives home with wife; children live in [place]    Social Determinants of Health   Financial Resource Strain: Low Risk    Difficulty of Paying Living Expenses: Not hard at all  Food Insecurity: No Food Insecurity   Worried About Programme researcher, broadcasting/film/video in the Last Year: Never true   Ran Out of Food in the Last Year: Never true  Transportation Needs: No  Transportation Needs   Lack of Transportation (Medical): No   Lack of Transportation (Non-Medical): No  Physical Activity: Sufficiently Active   Days of Exercise per Week: 7 days   Minutes of Exercise per Session: 60 min  Stress: No Stress Concern Present   Feeling of Stress : Not at all  Social Connections: Socially Integrated   Frequency of Communication with Friends and Family: Twice a week   Frequency of Social Gatherings with Friends and Family: Twice a week   Attends Religious Services: More than 4 times per year   Active Member of [employer] or Organizations: Yes   Attends Engineer, structural: More than 4 times per year   Marital Status: Married  Intimate  Partner Violence: Not At Risk   Fear of Current or Ex-Partner: No   Emotionally Abused: No   Physically Abused: No   Sexually Abused: No      Objective:    BP 127/77   Pulse 69   Temp (!) 97.4 F (36.3 C) (Temporal)   Ht 5\' 10"  (1.778 m)   Wt 224 lb (101.6 kg)   SpO2 96%   BMI 32.14 kg/m   Wt Readings from Last 3 Encounters:  07/04/21 224 lb (101.6 kg)  04/02/21 226 lb (102.5 kg)  10/02/20 226 lb 12.8 oz (102.9 kg)    Physical Exam Vitals reviewed.  Constitutional:      General: He is not in acute distress.    Appearance: Normal appearance. He is obese. He is not ill-appearing, toxic-appearing or diaphoretic.  HENT:     Head: Normocephalic and atraumatic.     Right Ear: Tympanic membrane, ear canal and external ear normal. There is no impacted cerumen.     Left Ear: Tympanic membrane, ear canal and external ear normal. There is no impacted cerumen.     Nose: Nose normal. No congestion or rhinorrhea.     Mouth/Throat:     Mouth: Mucous membranes are moist.     Pharynx: Oropharynx is clear. No oropharyngeal exudate or posterior oropharyngeal erythema.  Eyes:     General: No scleral icterus.       Right eye: No discharge.        Left eye: No discharge.     Conjunctiva/sclera: Conjunctivae normal.      Pupils: Pupils are equal, round, and reactive to light.  Neck:     Vascular: No carotid bruit.  Cardiovascular:     Rate and Rhythm: Normal rate and regular rhythm.     Heart sounds: Normal heart sounds. No murmur heard.   No friction rub. No gallop.  Pulmonary:     Effort: Pulmonary effort is normal. No respiratory distress.     Breath sounds: Normal breath sounds. No stridor. No wheezing, rhonchi or rales.  Abdominal:     General: Abdomen is flat. Bowel sounds are normal. There is no distension.     Palpations: Abdomen is soft. There is no hepatomegaly, splenomegaly or mass.     Tenderness: There is no abdominal tenderness. There is no guarding or rebound.     Hernia: No hernia is present.  Musculoskeletal:        General: Normal range of motion.     Cervical back: Normal range of motion and neck supple. No rigidity. No muscular tenderness.     Right lower leg: No edema.     Left lower leg: No edema.  Lymphadenopathy:     Cervical: No cervical adenopathy.  Skin:    General: Skin is warm and dry.     Capillary Refill: Capillary refill takes less than 2 seconds.  Neurological:     General: No focal deficit present.     Mental Status: He is alert and oriented to person, place, and time. Mental status is at baseline.  Psychiatric:        Mood and Affect: Mood normal.        Behavior: Behavior normal.        Thought Content: Thought content normal.        Judgment: Judgment normal.    Lab Results  Component Value Date   TSH 1.130 04/02/2021   Lab Results  Component Value Date   WBC 4.0 04/02/2021  HGB 13.3 04/02/2021   HCT 39.1 04/02/2021   MCV 94 04/02/2021   PLT 264 04/02/2021   Lab Results  Component Value Date   NA 141 04/02/2021   K 4.5 04/02/2021   CO2 25 04/02/2021   GLUCOSE 97 04/02/2021   BUN 19 04/02/2021   CREATININE 1.16 04/02/2021   BILITOT 0.6 04/02/2021   ALKPHOS 61 04/02/2021   AST 21 04/02/2021   ALT 26 04/02/2021   PROT 6.0 04/02/2021    ALBUMIN 3.8 04/02/2021   CALCIUM 9.5 04/02/2021   EGFR 67 04/02/2021   Lab Results  Component Value Date   CHOL 153 04/02/2021   Lab Results  Component Value Date   HDL 56 04/02/2021   Lab Results  Component Value Date   LDLCALC 84 04/02/2021   Lab Results  Component Value Date   TRIG 63 04/02/2021   Lab Results  Component Value Date   CHOLHDL 2.7 04/02/2021   Lab Results  Component Value Date   HGBA1C 5.3 04/02/2021

## 2021-07-05 LAB — CBC WITH DIFFERENTIAL/PLATELET
Basophils Absolute: 0 10*3/uL (ref 0.0–0.2)
Basos: 1 %
EOS (ABSOLUTE): 0.1 10*3/uL (ref 0.0–0.4)
Eos: 2 %
Hematocrit: 42.7 % (ref 37.5–51.0)
Hemoglobin: 14.2 g/dL (ref 13.0–17.7)
Immature Grans (Abs): 0 10*3/uL (ref 0.0–0.1)
Immature Granulocytes: 1 %
Lymphocytes Absolute: 1.3 10*3/uL (ref 0.7–3.1)
Lymphs: 25 %
MCH: 30.5 pg (ref 26.6–33.0)
MCHC: 33.3 g/dL (ref 31.5–35.7)
MCV: 92 fL (ref 79–97)
Monocytes Absolute: 0.6 10*3/uL (ref 0.1–0.9)
Monocytes: 11 %
Neutrophils Absolute: 3.2 10*3/uL (ref 1.4–7.0)
Neutrophils: 60 %
Platelets: 317 10*3/uL (ref 150–450)
RBC: 4.66 x10E6/uL (ref 4.14–5.80)
RDW: 12 % (ref 11.6–15.4)
WBC: 5.3 10*3/uL (ref 3.4–10.8)

## 2021-07-05 LAB — LIPID PANEL
Chol/HDL Ratio: 2.7 ratio (ref 0.0–5.0)
Cholesterol, Total: 144 mg/dL (ref 100–199)
HDL: 54 mg/dL (ref >39)
LDL Chol Calc (NIH): 76 mg/dL (ref 0–99)
Triglycerides: 67 mg/dL (ref 0–149)
VLDL Cholesterol Cal: 14 mg/dL (ref 5–40)

## 2021-07-05 LAB — CMP14+EGFR
ALT: 32 IU/L (ref 0–44)
AST: 27 IU/L (ref 0–40)
Albumin/Globulin Ratio: 1.7 (ref 1.2–2.2)
Albumin: 3.9 g/dL (ref 3.7–4.7)
Alkaline Phosphatase: 74 IU/L (ref 44–121)
BUN/Creatinine Ratio: 18 (ref 10–24)
BUN: 22 mg/dL (ref 8–27)
Bilirubin Total: 0.8 mg/dL (ref 0.0–1.2)
CO2: 24 mmol/L (ref 20–29)
Calcium: 9.4 mg/dL (ref 8.6–10.2)
Chloride: 106 mmol/L (ref 96–106)
Creatinine, Ser: 1.22 mg/dL (ref 0.76–1.27)
Globulin, Total: 2.3 g/dL (ref 1.5–4.5)
Glucose: 107 mg/dL — ABNORMAL HIGH (ref 70–99)
Potassium: 4.4 mmol/L (ref 3.5–5.2)
Sodium: 144 mmol/L (ref 134–144)
Total Protein: 6.2 g/dL (ref 6.0–8.5)
eGFR: 63 mL/min/{1.73_m2} (ref >59)

## 2021-07-05 LAB — T4, FREE: Free T4: 1.61 ng/dL (ref 0.82–1.77)

## 2021-07-05 LAB — TSH: TSH: 1.06 u[IU]/mL (ref 0.450–4.500)

## 2021-09-27 NOTE — Patient Instructions (Signed)
Shawn Ochoa , Thank you for taking time to come for your Medicare Wellness Visit. I appreciate your ongoing commitment to your health goals. Please review the following plan we discussed and let me know if I can assist you in the future.   Screening recommendations/referrals: Colonoscopy: Done 01/02/2020 - Repeat in 5 years Recommended yearly ophthalmology/optometry visit for glaucoma screening and checkup Recommended yearly dental visit for hygiene and checkup  Vaccinations: Influenza vaccine: Done 03/05/2021 - Repeat annually Pneumococcal vaccine: Done 05/12/2014 & 06/18/2016     Tdap vaccine: Done 01/27/2018 - Repeat in 10 years  Shingles vaccine: Done 10/02/2020 & 04/02/2021  Covid-19: Done 05/10/2019, 06/07/2019, 02/15/2020, 08/15/2020, & 01/23/2021  Advanced directives: Please bring a copy of your health care power of attorney and living will to the office to be added to your chart at your convenience.   Conditions/risks identified: Keep up the great work! Great job losing weight! Aim for 30 minutes of exercise or brisk walking, 6-8 glasses of water, and 5 servings of fruits and vegetables each day.   Next appointment: Follow up in one year for your annual wellness visit.   Preventive Care 23 Years and Older, Male  Preventive care refers to lifestyle choices and visits with your health care provider that can promote health and wellness. What does preventive care include? A yearly physical exam. This is also called an annual well check. Dental exams once or twice a year. Routine eye exams. Ask your health care provider how often you should have your eyes checked. Personal lifestyle choices, including: Daily care of your teeth and gums. Regular physical activity. Eating a healthy diet. Avoiding tobacco and drug use. Limiting alcohol use. Practicing safe sex. Taking low doses of aspirin every day. Taking vitamin and mineral supplements as recommended by your health care provider. What  happens during an annual well check? The services and screenings done by your health care provider during your annual well check will depend on your age, overall health, lifestyle risk factors, and family history of disease. Counseling  Your health care provider may ask you questions about your: Alcohol use. Tobacco use. Drug use. Emotional well-being. Home and relationship well-being. Sexual activity. Eating habits. History of falls. Memory and ability to understand (cognition). Work and work Statistician. Screening  You may have the following tests or measurements: Height, weight, and BMI. Blood pressure. Lipid and cholesterol levels. These may be checked every 5 years, or more frequently if you are over 22 years old. Skin check. Lung cancer screening. You may have this screening every year starting at age 26 if you have a 30-pack-year history of smoking and currently smoke or have quit within the past 15 years. Fecal occult blood test (FOBT) of the stool. You may have this test every year starting at age 14. Flexible sigmoidoscopy or colonoscopy. You may have a sigmoidoscopy every 5 years or a colonoscopy every 10 years starting at age 7. Prostate cancer screening. Recommendations will vary depending on your family history and other risks. Hepatitis C blood test. Hepatitis B blood test. Sexually transmitted disease (STD) testing. Diabetes screening. This is done by checking your blood sugar (glucose) after you have not eaten for a while (fasting). You may have this done every 1-3 years. Abdominal aortic aneurysm (AAA) screening. You may need this if you are a current or former smoker. Osteoporosis. You may be screened starting at age 76 if you are at high risk. Talk with your health care provider about your test results,  treatment options, and if necessary, the need for more tests. Vaccines  Your health care provider may recommend certain vaccines, such as: Influenza vaccine. This  is recommended every year. Tetanus, diphtheria, and acellular pertussis (Tdap, Td) vaccine. You may need a Td booster every 10 years. Zoster vaccine. You may need this after age 49. Pneumococcal 13-valent conjugate (PCV13) vaccine. One dose is recommended after age 4. Pneumococcal polysaccharide (PPSV23) vaccine. One dose is recommended after age 69. Talk to your health care provider about which screenings and vaccines you need and how often you need them. This information is not intended to replace advice given to you by your health care provider. Make sure you discuss any questions you have with your health care provider. Document Released: 04/27/2015 Document Revised: 12/19/2015 Document Reviewed: 01/30/2015 Elsevier Interactive Patient Education  2017 [employer] Prevention in the Home Falls can cause injuries. They can happen to people of all ages. There are many things you can do to make your home safe and to help prevent falls. What can I do on the outside of my home? Regularly fix the edges of walkways and driveways and fix any cracks. Remove anything that might make you trip as you walk through a door, such as a raised step or threshold. Trim any bushes or trees on the path to your home. Use bright outdoor lighting. Clear any walking paths of anything that might make someone trip, such as rocks or tools. Regularly check to see if handrails are loose or broken. Make sure that both sides of any steps have handrails. Any raised decks and porches should have guardrails on the edges. Have any leaves, snow, or ice cleared regularly. Use sand or salt on walking paths during winter. Clean up any spills in your garage right away. This includes oil or grease spills. What can I do in the bathroom? Use night lights. Install grab bars by the toilet and in the tub and shower. Do not use towel bars as grab bars. Use non-skid mats or decals in the tub or shower. If you need to sit down  in the shower, use a plastic, non-slip stool. Keep the floor dry. Clean up any water that spills on the floor as soon as it happens. Remove soap buildup in the tub or shower regularly. Attach bath mats securely with double-sided non-slip rug tape. Do not have throw rugs and other things on the floor that can make you trip. What can I do in the bedroom? Use night lights. Make sure that you have a light by your bed that is easy to reach. Do not use any sheets or blankets that are too big for your bed. They should not hang down onto the floor. Have a firm chair that has side arms. You can use this for support while you get dressed. Do not have throw rugs and other things on the floor that can make you trip. What can I do in the kitchen? Clean up any spills right away. Avoid walking on wet floors. Keep items that you use a lot in easy-to-reach places. If you need to reach something above you, use a strong step stool that has a grab bar. Keep electrical cords out of the way. Do not use floor polish or wax that makes floors slippery. If you must use wax, use non-skid floor wax. Do not have throw rugs and other things on the floor that can make you trip. What can I do with my stairs? Do  not leave any items on the stairs. Make sure that there are handrails on both sides of the stairs and use them. Fix handrails that are broken or loose. Make sure that handrails are as long as the stairways. Check any carpeting to make sure that it is firmly attached to the stairs. Fix any carpet that is loose or worn. Avoid having throw rugs at the top or bottom of the stairs. If you do have throw rugs, attach them to the floor with carpet tape. Make sure that you have a light switch at the top of the stairs and the bottom of the stairs. If you do not have them, ask someone to add them for you. What else can I do to help prevent falls? Wear shoes that: Do not have high heels. Have rubber bottoms. Are comfortable  and fit you well. Are closed at the toe. Do not wear sandals. If you use a stepladder: Make sure that it is fully opened. Do not climb a closed stepladder. Make sure that both sides of the stepladder are locked into place. Ask someone to hold it for you, if possible. Clearly mark and make sure that you can see: Any grab bars or handrails. First and last steps. Where the edge of each step is. Use tools that help you move around (mobility aids) if they are needed. These include: Canes. Walkers. Scooters. Crutches. Turn on the lights when you go into a dark area. Replace any light bulbs as soon as they burn out. Set up your furniture so you have a clear path. Avoid moving your furniture around. If any of your floors are uneven, fix them. If there are any pets around you, be aware of where they are. Review your medicines with your doctor. Some medicines can make you feel dizzy. This can increase your chance of falling. Ask your doctor what other things that you can do to help prevent falls. This information is not intended to replace advice given to you by your health care provider. Make sure you discuss any questions you have with your health care provider. Document Released: 01/25/2009 Document Revised: 09/06/2015 Document Reviewed: 05/05/2014 Elsevier Interactive Patient Education  2017 [employer].

## 2021-09-30 ENCOUNTER — Ambulatory Visit (INDEPENDENT_AMBULATORY_CARE_PROVIDER_SITE_OTHER): Payer: Medicare HMO

## 2021-09-30 ENCOUNTER — Other Ambulatory Visit: Payer: Self-pay | Admitting: Family Medicine

## 2021-09-30 VITALS — Wt 208.0 lb

## 2021-09-30 DIAGNOSIS — Z0000 Encounter for general adult medical examination without abnormal findings: Secondary | ICD-10-CM | POA: Diagnosis not present

## 2021-09-30 DIAGNOSIS — I10 Essential (primary) hypertension: Secondary | ICD-10-CM

## 2021-09-30 DIAGNOSIS — E782 Mixed hyperlipidemia: Secondary | ICD-10-CM

## 2021-09-30 NOTE — Progress Notes (Cosign Needed)
Subjective:   Shawn Ochoa is a 72 y.o. male who presents for Medicare Annual/Subsequent preventive examination.  Virtual Visit via Telephone Note  I connected with  Shawn Ochoa on 09/30/21 at  9:00 AM EDT by telephone and verified that I am speaking with the correct person using two identifiers.  Location: Patient: Home Provider: [facility] Persons participating in the virtual visit: patient/Nurse Health Advisor   I discussed the limitations, risks, security and privacy concerns of performing an evaluation and management service by telephone and the availability of in person appointments. The patient expressed understanding and agreed to proceed.  Interactive audio and video telecommunications were attempted between this nurse and patient, however failed, due to patient having technical difficulties OR patient did not have access to video capability.  We continued and completed visit with audio only.  Some vital signs may be absent or patient reported.   Shawn Ochoa E Dahmir Epperly, LPN   Review of Systems     Cardiac Risk Factors include: advanced age (>11mn, >>11women);dyslipidemia;hypertension;male gender     Objective:    Today's Vitals   09/30/21 0905  Weight: 208 lb (94.3 kg)   Body mass index is 29.84 kg/m.     09/30/2021    9:18 AM 09/27/2020   10:17 AM 09/27/2019    9:58 AM 09/22/2018    8:37 AM  Advanced Directives  Does Patient Have a Medical Advance Directive? Yes Yes Yes Yes  Type of AP[place]of [place] Living will;Healthcare Power of Attorney  Does patient want to make changes to medical advance directive?   No - Patient declined No - Patient declined  Copy of HDe [place] Chart? No - copy requested No - copy requested  No - copy requested    Current Medications (verified) Outpatient Encounter Medications as of  09/30/2021  Medication Sig   aspirin 81 MG EC tablet Take 81 mg by mouth daily.     atorvastatin (LIPITOR) 20 MG tablet Take 1 tablet (20 mg total) by mouth daily.   benazepril (LOTENSIN) 20 MG tablet Take 1 tablet (20 mg total) by mouth daily.   Calcium Carbonate-Vitamin D (CALCIUM + D PO) Take 1 capsule by mouth daily.     Cholecalciferol (VITAMIN D3) 5000 UNITS CAPS Take 1 capsule by mouth daily.     DENTAGEL 1.1 % GEL dental gel PLEASE SEE ATTACHED FOR DETAILED DIRECTIONS   fluticasone (FLONASE) 50 MCG/ACT nasal spray Place 1 spray into both nostrils daily.   levothyroxine (SYNTHROID) 75 MCG tablet Take 1 tablet (75 mcg total) by mouth daily.   Omega-3 Fatty Acids (FISH OIL) 1000 MG CAPS Take 1 capsule by mouth 2 (two) times daily.     No facility-administered encounter medications on file as of 09/30/2021.    Allergies (verified) Mobic [meloxicam]   History: Past Medical History:  Diagnosis Date   Adenomatous polyp    Cardiac dysrhythmia, unspecified    Cataract    Essential hypertension, benign    Hemorrhage of gastrointestinal tract, unspecified    Metabolic syndrome    Nontoxic multinodular goiter    Other and unspecified hyperlipidemia    Prostatism    Past Surgical History:  Procedure Laterality Date   CATARACT EXTRACTION     KNEE CARTILAGE SURGERY Right    THYROIDECTOMY     Family History  Problem Relation Age of Onset   Diabetes Mother  Hypertension Mother    Heart disease Mother    Stroke Father    Hypertension Father    Colon cancer Maternal Uncle 71   Colon cancer Other 12   Social History   Socioeconomic History   Marital status: Married    Spouse name: Hilda Blades   Number of children: 3   Years of education: Not on file   Highest education level: Professional school degree (e.g., MD, DDS, DVM, JD)  Occupational History   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  Tobacco Use   Smoking status: Never   Smokeless tobacco: Never  IT trainer Use: Never used  Substance and Sexual Activity   Alcohol use: Yes    Comment: rare   Drug use: No   Sexual activity: Not Currently  Other Topics Concern   Not on file  Social History Narrative   Lives home with wife; children live in [place] Determinants of Health   Financial Resource Strain: [place]  (09/30/2021)   Overall Financial Resource Strain (CARDIA)    Difficulty of Paying Living Expenses: Not hard at all  Food Insecurity: No Food Insecurity (09/30/2021)   Hunger Vital Sign    Worried About Running Out of Food in the Last Year: Never true    [place] in the Last Year: Never true  Transportation Needs: No Transportation Needs (09/30/2021)   PRAPARE - Hydrologist (Medical): No    Lack of Transportation (Non-Medical): No  Physical Activity: Sufficiently Active (09/30/2021)   Exercise Vital Sign    Days of Exercise per Week: 7 days    Minutes of Exercise per Session: 60 min  Stress: No Stress Concern Present (09/30/2021)   [place]    Feeling of Stress : Not at all  Social Connections: [place] (09/30/2021)   Social Connection and Isolation Panel [NHANES]    Frequency of Communication with Friends and Family: More than three times a week    Frequency of Social Gatherings with Friends and Family: Once a week    Attends Religious Services: More than 4 times per year    Active Member of [employer] or Organizations: Yes    Attends Music therapist: More than 4 times per year    Marital Status: Married    Tobacco Counseling Counseling given: Not Answered   Clinical Intake:  Pre-visit preparation completed: Yes  Pain : No/denies pain     BMI - recorded: 29.84 Nutritional Status: BMI 25 -29 Overweight Nutritional Risks: None Diabetes: No  How often do you need to have someone help you when you read instructions,  pamphlets, or other written materials from your doctor or pharmacy?: 1 - Never  Diabetic? no  Interpreter Needed?: No  Information entered by :: Shawn Strebel, LPN   Activities of Daily Living    09/30/2021    9:11 AM  In your present state of health, do you have any difficulty performing the following activities:  Hearing? 0  Vision? 0  Difficulty concentrating or making decisions? 0  Walking or climbing stairs? 0  Dressing or bathing? 0  Doing errands, shopping? 0  Preparing Food and eating ? N  Using the Toilet? N  In the past six months, have you accidently leaked urine? N  Do you have problems with loss of bowel control? N  Managing your Medications? N  Managing your  Finances? N  Housekeeping or managing your Housekeeping? N    Patient Care Team: Loman Brooklyn, FNP as PCP - General (Family Medicine) [facility] Irine Seal, MD as Attending Physician (Urology) Macario Carls, MD as Referring Physician (Dermatology)  Indicate any recent Medical Services you may have received from other than [facility] providers in the past year (date may be approximate).     Assessment:   This is a routine wellness examination for Shawn Ochoa.  Hearing/Vision screen Hearing Screening - Comments:: Denies hearing difficulties   Vision Screening - Comments:: Wears rx glasses - up to date with routine eye exams with [facility] - seeing him every 2 years  Dietary issues and exercise activities discussed: Current Exercise Habits: Home exercise routine, Type of exercise: walking (tries to get in 7,000 steps per day), Time (Minutes): 60, Frequency (Times/Week): 7, Weekly Exercise (Minutes/Week): 420, Intensity: Mild, Exercise limited by: orthopedic condition(s)   Goals Addressed             This Visit's Progress    Client will verbalize understanding of resources for managing BMI    On track    His goal is 7000 steps per day     Have 3 meals a day   On track    Eat 3 healthy  meals daily that consist of lean proteins, fresh fruits and vegetables.        Depression Screen    09/30/2021    9:09 AM 07/04/2021    9:13 AM 04/02/2021    8:56 AM 10/02/2020    8:51 AM 09/27/2020    9:55 AM 01/20/2020   10:33 AM 09/27/2019    9:30 AM  PHQ 2/9 Scores  PHQ - 2 Score 0 0 0 0 0 0 0  PHQ- 9 Score  0 0 0       Fall Risk    09/30/2021    9:08 AM 07/04/2021    9:13 AM 04/02/2021    8:56 AM 10/02/2020    8:51 AM 09/27/2020   10:17 AM  Fall Risk   Falls in the past year? 0 0 0 0 0  Number falls in past yr: 0    0  Injury with Fall? 0    0  Risk for fall due to : No Fall Risks    No Fall Risks  Follow up Falls prevention discussed    Falls prevention discussed    FALL RISK PREVENTION PERTAINING TO THE HOME:  Any stairs in or around the home? Yes  If so, are there any without handrails? No  Home free of loose throw rugs in walkways, pet beds, electrical cords, etc? Yes  Adequate lighting in your home to reduce risk of falls? Yes   ASSISTIVE DEVICES UTILIZED TO PREVENT FALLS:  Life alert? No  Use of a cane, walker or w/c? No  Grab bars in the bathroom? Yes  Shower chair or bench in shower? Yes  Elevated toilet seat or a handicapped toilet? Yes   TIMED UP AND GO:  Was the test performed? No . Telephonic visit  Cognitive Function:        09/30/2021    9:13 AM 09/27/2019    9:34 AM 09/22/2018    8:39 AM  6CIT Screen  What Year? 0 points 0 points 0 points  What month? 0 points 0 points 0 points  What time? 0 points 0 points 0 points  Count back from 20 0 points 0 points 0 points  Months in reverse 0 points 0 points 0 points  Repeat phrase 0 points  0 points  Total Score 0 points  0 points    Immunizations Immunization History  Administered Date(s) Administered   Fluad Quad(high Dose 65+) 01/26/2019, 01/20/2020, 03/05/2021   Influenza Split 01/15/2016   Influenza Whole 01/12/2009   Influenza, High Dose Seasonal PF 01/06/2017, 01/27/2018    Influenza-Unspecified 01/12/2014, 02/22/2015   Moderna Covid-19 Vaccine Bivalent Booster 70yr & up 01/23/2021   Moderna Sars-Covid-2 Vaccination 05/10/2019, 06/07/2019, 02/15/2020, 08/15/2020   Pneumococcal Conjugate-13 05/12/2014   Pneumococcal Polysaccharide-23 06/12/1996, 06/18/2016   Td 02/12/2005, 01/27/2018   Tdap 01/10/2010   Zoster Recombinat (Shingrix) 10/02/2020, 04/02/2021   Zoster, Live 07/26/2009    TDAP status: Up to date  Flu Vaccine status: Up to date  Pneumococcal vaccine status: Up to date  Covid-19 vaccine status: Completed vaccines  Qualifies for Shingles Vaccine? Yes   Zostavax completed Yes   Shingrix Completed?: Yes  Screening Tests Health Maintenance  Topic Date Due   COVID-19 Vaccine (6 - Moderna series) 05/26/2021   INFLUENZA VACCINE  11/12/2021   COLONOSCOPY (Pts 45-447yrInsurance coverage will need to be confirmed)  01/01/2025   TETANUS/TDAP  01/28/2028   Pneumonia Vaccine 6582Years old  Completed   Hepatitis C Screening  Completed   Zoster Vaccines- Shingrix  Completed   HPV VACCINES  Aged Out   COLON CANCER SCREENING ANNUAL FOBT  Discontinued    Health Maintenance  Health Maintenance Due  Topic Date Due   COVID-19 Vaccine (6 - Moderna series) 05/26/2021    Colorectal cancer screening: Type of screening: Colonoscopy. Completed 01/02/2020. Repeat every 5 years  Lung Cancer Screening: (Low Dose CT Chest recommended if Age 73-80ears, 30 pack-year currently smoking OR have quit w/in 15years.) does not qualify.    Additional Screening:  Hepatitis C Screening: does qualify; Completed 01/06/2017  Vision Screening: Recommended annual ophthalmology exams for early detection of glaucoma and other disorders of the eye. Is the patient up to date with their annual eye exam?  Yes  Who is the provider or what is the name of the office in which the patient attends annual eye exams? [facility] If pt is not established with a provider, would they like  to be referred to a provider to establish care? No .   Dental Screening: Recommended annual dental exams for proper oral hygiene  Community Resource Referral / Chronic Care Management: CRR required this visit?  No   CCM required this visit?  No      Plan:     I have personally reviewed and noted the following in the patient's chart:   Medical and social history Use of alcohol, tobacco or illicit drugs  Current medications and supplements including opioid prescriptions. Patient is not currently taking opioid prescriptions. Functional ability and status Nutritional status Physical activity Advanced directives List of other physicians Hospitalizations, surgeries, and ER visits in previous 12 months Vitals Screenings to include cognitive, depression, and falls Referrals and appointments  In addition, I have reviewed and discussed with patient certain preventive protocols, quality metrics, and best practice recommendations. A written personalized care plan for preventive services as well as general preventive health recommendations were provided to patient.     AmSandrea HammondLPN   09/15/59/4431 Nurse Notes: Hips and lower back hurt and wakes him up every 1-2 hours.

## 2021-10-31 ENCOUNTER — Other Ambulatory Visit: Payer: Medicare HMO

## 2021-10-31 DIAGNOSIS — R972 Elevated prostate specific antigen [PSA]: Secondary | ICD-10-CM

## 2021-11-01 LAB — PSA: Prostate Specific Ag, Serum: 3.8 ng/mL (ref 0.0–4.0)

## 2021-11-07 ENCOUNTER — Encounter: Payer: Self-pay | Admitting: Urology

## 2021-11-07 ENCOUNTER — Ambulatory Visit: Payer: Medicare HMO | Admitting: Urology

## 2021-11-07 VITALS — BP 127/76 | HR 75

## 2021-11-07 DIAGNOSIS — N401 Enlarged prostate with lower urinary tract symptoms: Secondary | ICD-10-CM | POA: Diagnosis not present

## 2021-11-07 DIAGNOSIS — Z87440 Personal history of urinary (tract) infections: Secondary | ICD-10-CM

## 2021-11-07 DIAGNOSIS — R3912 Poor urinary stream: Secondary | ICD-10-CM

## 2021-11-07 DIAGNOSIS — R972 Elevated prostate specific antigen [PSA]: Secondary | ICD-10-CM

## 2021-11-07 DIAGNOSIS — N138 Other obstructive and reflux uropathy: Secondary | ICD-10-CM

## 2021-11-07 DIAGNOSIS — R351 Nocturia: Secondary | ICD-10-CM

## 2021-11-07 NOTE — Progress Notes (Signed)
Subjective:  1. Elevated PSA   2. BPH with urinary obstruction   3. Weak urinary stream   4. Nocturia   5. Personal history of urinary infection       CC: Elevated PSA.    HPI: Shawn Ochoa is a 71 year-old male established patient who is here for an elevated PSA.  11/07/21: Shawn Ochoa returns today in f/u for the history noted below.  His PSA on 10/31/21 was 3.8 which is a further decline.   He has stable LUTS but a reduced stream.  His IPSS is 12.   11/08/20: His most recent PSA was  4.4 with a 22.3% f/t ratio on 01/20/20.  It was 2.3 on 01/26/19.  His UA is clear.  He is voiding with stable LUTS and an IPSS of 11.  He has nocturia x 3 with some frequency and an occasional reduced stream.     GU Hx: Shawn Ochoa returns today in f/u for his history of an elevated PSA that was up to 11.1 in early October 2018.  His PSA has returned to baseline and was 2.3 in 10/20.  He was felt to have had prostatitis responsible for the elevation. I last saw him in 2012 at which time he had a history of an elevated PSA with prostatitits in 2008.Marland Kitchen His PSA prior to his 2012 visit was down to 1.6. His PSA had been as high as 12. He has had 2 negative biopsies with the last in 2009 and a negative PCA3. He has had no dysuria or hematuria. His IPSS is 10 which is is stable.  He has nocturia x 3 and a reduced stream.  He has occasional urgency.   His UA is unremarkable today.  He has no other associated signs or symptoms.       IPSS     Row Name 11/07/21 0900         International Prostate Symptom Score   How often have you had the sensation of not emptying your bladder? Less than 1 in 5     How often have you had to urinate less than every two hours? Less than 1 in 5 times     How often have you found you stopped and started again several times when you urinated? Less than 1 in 5 times     How often have you found it difficult to postpone urination? Less than 1 in 5 times     How often have you had a weak  urinary stream? Almost always     How often have you had to strain to start urination? Not at All     How many times did you typically get up at night to urinate? 3 Times     Total IPSS Score 12       Quality of Life due to urinary symptoms   If you were to spend the rest of your life with your urinary condition just the way it is now how would you feel about that? Mostly Satisfied                ROS:  ROS:  A complete review of systems was performed.  All systems are negative except for pertinent findings as noted.   Review of Systems  HENT:  Positive for congestion.     Allergies  Allergen Reactions   Mobic [Meloxicam] Other (See Comments)    Rectal bleeding with only 2 pills     Outpatient Encounter Medications as  of 11/07/2021  Medication Sig   aspirin 81 MG EC tablet Take 81 mg by mouth daily.     atorvastatin (LIPITOR) 20 MG tablet TAKE 1 TABLET BY MOUTH EVERY DAY   benazepril (LOTENSIN) 20 MG tablet TAKE 1 TABLET BY MOUTH EVERY DAY   Calcium Carbonate-Vitamin D (CALCIUM + D PO) Take 1 capsule by mouth daily.     Cholecalciferol (VITAMIN D3) 5000 UNITS CAPS Take 1 capsule by mouth daily.     DENTAGEL 1.1 % GEL dental gel PLEASE SEE ATTACHED FOR DETAILED DIRECTIONS   fluticasone (FLONASE) 50 MCG/ACT nasal spray Place 1 spray into both nostrils daily.   levothyroxine (SYNTHROID) 75 MCG tablet Take 1 tablet (75 mcg total) by mouth daily.   Omega-3 Fatty Acids (FISH OIL) 1000 MG CAPS Take 1 capsule by mouth 2 (two) times daily.     No facility-administered encounter medications on file as of 11/07/2021.    Past Medical History:  Diagnosis Date   Adenomatous polyp    Cardiac dysrhythmia, unspecified    Cataract    Essential hypertension, benign    Hemorrhage of gastrointestinal tract, unspecified    Metabolic syndrome    Nontoxic multinodular goiter    Other and unspecified hyperlipidemia    Prostatism     Past Surgical History:  Procedure Laterality Date    CATARACT EXTRACTION     KNEE CARTILAGE SURGERY Right    THYROIDECTOMY      Social History   Socioeconomic History   Marital status: Married    Spouse name: Hilda Blades   Number of children: 3   Years of education: Not on file   Highest education level: Professional school degree (e.g., MD, DDS, DVM, JD)  Occupational History   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    Social Determinants of Health   Financial Resource Strain: Low Risk  (09/30/2021)   Overall Financial Resource Strain (CARDIA)    Difficulty of Paying Living Expenses: Not hard at all  Food Insecurity: No Food Insecurity (09/30/2021)   Hunger Vital Sign    Worried About Running Out of Food in the Last Year: Never true    Ran Out of Food in the Last Year: Never true  Transportation Needs: No Transportation Needs (09/30/2021)   PRAPARE - Hydrologist (Medical): No    Lack of Transportation (Non-Medical): No  Physical Activity: Sufficiently Active (09/30/2021)   Exercise Vital Sign    Days of Exercise per Week: 7 days    Minutes of Exercise per Session: 60 min  Stress: No Stress Concern Present (09/30/2021)   [place]    Feeling of Stress : Not at all  Social Connections: [place] (09/30/2021)   Social Connection and Isolation Panel [NHANES]    Frequency of Communication with Friends and Family: More than three times a week    Frequency of Social Gatherings with Friends and Family: Once a week    Attends Religious Services: More than 4 times per year    Active  Member of  Clubs or Organizations: Yes    Attends Archivist Meetings: More than 4 times per year    Marital Status: Married  Human resources officer Violence: Not At Risk (09/30/2021)   Humiliation, Afraid, Rape, and Kick questionnaire    Fear of Current or Ex-Partner: No    Emotionally Abused: No    Physically Abused: No    Sexually Abused: No    Family History  Problem Relation Age of Onset   Diabetes Mother    Hypertension Mother    Heart disease Mother    Stroke Father    Hypertension Father    Colon cancer Maternal Uncle 19   Colon cancer Other 20       Objective: Vitals:   11/07/21 0944  BP: 127/76  Pulse: 75     Physical Exam Vitals reviewed.  Constitutional:      Appearance: Normal appearance.  Genitourinary:    Comments: AP normal. NST without mass. Prostate 3+ benign. SV's non-palpable.  Neurological:     Mental Status: He is alert.     Lab Results:  No results found for this or any previous visit (from the past 24 hour(s)).    BMET No results for input(s): "NA", "K", "CL", "CO2", "GLUCOSE", "BUN", "CREATININE", "CALCIUM" in the last 72 hours. PSA Lab Results  Component Value Date   PSA1 3.8 10/31/2021     PSA was 2.3 in 10/21  PSA  Date Value Ref Range Status  11/07/2013 2.2 0.0 - 4.0 ng/mL Final    Comment:    Roche ECLIA methodology. According to the American Urological Association, Serum PSA should decrease and remain at undetectable levels after radical prostatectomy. The AUA defines biochemical recurrence as an initial PSA value 0.2 ng/mL or greater followed by a subsequent confirmatory PSA value 0.2 ng/mL or greater. Values obtained with different assay methods or kits cannot be used interchangeably. Results cannot be interpreted as absolute evidence of the presence or absence of malignant disease.   No results found for: "TESTOSTERONE"  UA is clear  Studies/Results: No results found.    Assessment & Plan:  Elevated PSA.   His PSA is down to normal.  I will just have him continue PSA f/u with his PCP.   BPH with BOO.  He has stable moderate LUTS and an enlarged benign prostate on exam.   No treatment desired at this time.    History of UTI.  His UA is clear today.   No orders of the defined types were placed in this encounter.    Orders Placed This Encounter  Procedures   Urinalysis, Routine w reflex microscopic      Return if symptoms worsen or fail to improve.   CC: Loman Brooklyn, FNP      Irine Seal 11/07/2021

## 2021-11-08 LAB — URINALYSIS, ROUTINE W REFLEX MICROSCOPIC
Bilirubin, UA: NEGATIVE
Glucose, UA: NEGATIVE
Ketones, UA: NEGATIVE
Leukocytes,UA: NEGATIVE
Nitrite, UA: NEGATIVE
Protein,UA: NEGATIVE
RBC, UA: NEGATIVE
Specific Gravity, UA: 1.02 (ref 1.005–1.030)
Urobilinogen, Ur: 0.2 mg/dL (ref 0.2–1.0)
pH, UA: 5 (ref 5.0–7.5)

## 2021-12-30 ENCOUNTER — Other Ambulatory Visit: Payer: Self-pay | Admitting: Family Medicine

## 2021-12-30 DIAGNOSIS — E782 Mixed hyperlipidemia: Secondary | ICD-10-CM

## 2021-12-30 DIAGNOSIS — I10 Essential (primary) hypertension: Secondary | ICD-10-CM

## 2022-01-22 ENCOUNTER — Other Ambulatory Visit: Payer: Self-pay | Admitting: Family Medicine

## 2022-01-22 DIAGNOSIS — E782 Mixed hyperlipidemia: Secondary | ICD-10-CM

## 2022-01-22 DIAGNOSIS — I10 Essential (primary) hypertension: Secondary | ICD-10-CM

## 2022-01-22 NOTE — Telephone Encounter (Signed)
Shawn Ochoa pt NTBS by new provider 30 days given 12/30/21

## 2022-01-23 ENCOUNTER — Encounter: Payer: Self-pay | Admitting: Family Medicine

## 2022-01-23 NOTE — Telephone Encounter (Signed)
Sent letter

## 2022-01-26 ENCOUNTER — Other Ambulatory Visit: Payer: Self-pay | Admitting: Family Medicine

## 2022-01-26 DIAGNOSIS — I10 Essential (primary) hypertension: Secondary | ICD-10-CM

## 2022-01-26 DIAGNOSIS — E782 Mixed hyperlipidemia: Secondary | ICD-10-CM

## 2022-01-27 ENCOUNTER — Encounter: Payer: Self-pay | Admitting: Family Medicine

## 2022-01-27 NOTE — Telephone Encounter (Signed)
Shawn Ochoa pt NTBS by new pt 30 days given 12/31/21

## 2022-01-27 NOTE — Telephone Encounter (Signed)
Letter sent.

## 2022-02-08 ENCOUNTER — Other Ambulatory Visit: Payer: Self-pay | Admitting: Family Medicine

## 2022-02-08 DIAGNOSIS — E890 Postprocedural hypothyroidism: Secondary | ICD-10-CM

## 2022-02-13 ENCOUNTER — Encounter: Payer: Self-pay | Admitting: Family Medicine

## 2022-02-13 ENCOUNTER — Ambulatory Visit (INDEPENDENT_AMBULATORY_CARE_PROVIDER_SITE_OTHER): Payer: Medicare HMO | Admitting: Family Medicine

## 2022-02-13 VITALS — BP 136/80 | HR 63 | Temp 97.2°F | Ht 70.0 in | Wt 193.2 lb

## 2022-02-13 DIAGNOSIS — E890 Postprocedural hypothyroidism: Secondary | ICD-10-CM

## 2022-02-13 DIAGNOSIS — I10 Essential (primary) hypertension: Secondary | ICD-10-CM

## 2022-02-13 DIAGNOSIS — E782 Mixed hyperlipidemia: Secondary | ICD-10-CM | POA: Diagnosis not present

## 2022-02-13 DIAGNOSIS — E559 Vitamin D deficiency, unspecified: Secondary | ICD-10-CM | POA: Diagnosis not present

## 2022-02-13 MED ORDER — ATORVASTATIN CALCIUM 20 MG PO TABS: 20.0000 mg | ORAL_TABLET | Freq: Every day | ORAL | 1 refills | Status: DC

## 2022-02-13 NOTE — Progress Notes (Signed)
Subjective:  Patient ID: Shawn Ochoa, male    DOB: February 19, 1951, 71 y.o.   MRN: [id number]  Patient Care Team: Baruch Gouty, FNP as PCP - General (Family Medicine) [facility] Irine Seal, MD as Attending Physician (Urology) Macario Carls, MD as Referring Physician (Dermatology)   Chief Complaint:  Establish Care [name] patient ) and Medical Management of Chronic Issues   HPI: Shawn Ochoa is a 71 y.o. male presenting on 02/13/2022 for Establish Care [name] patient ) and Medical Management of Chronic Issues   Pt presents today to establish care with new PCP and for management of chronic medical conditions.   1. Mixed hyperlipidemia Compliant with medications - Yes Current medications - atorvastatin  Side effects from medications - No Diet - generally healthy Exercise - active daily  2. Essential hypertension Complaint with meds - Yes Current Medications - benazepril Checking BP at home and normal range Exercising Regularly - No Watching Salt intake - Yes Pertinent ROS:  Headache - No Fatigue - No Visual Disturbances - No Chest pain - No Dyspnea - No Palpitations - No LE edema - No They report good compliance with medications and can restate their regimen by memory. No medication side effects.  BP Readings from Last 3 Encounters:  02/13/22 136/80  11/07/21 127/76  07/04/21 127/77    3. Postoperative hypothyroidism Had recurrent benign cysts so thyroidectomy was completed. Has been on levothyroxine and tolerating well. Denies Hyper- or hypothyroidism symptoms.   4. Vitamin D deficiency Pt is taking oral repletion therapy. Denies bone pain and tenderness, muscle weakness, fracture, and difficulty walking. Lab Results  Component Value Date   VD25OH 56.7 04/02/2021   VD25OH 66.1 10/02/2020   VD25OH 57.0 07/21/2019   Lab Results  Component Value Date   CALCIUM 9.4 07/04/2021       Relevant past medical, surgical,  family, and social history reviewed and updated as indicated.  Allergies and medications reviewed and updated. Data reviewed: Chart in Epic.   Past Medical History:  Diagnosis Date   Adenomatous polyp    Cardiac dysrhythmia, unspecified    Cataract    Essential hypertension, benign    Hemorrhage of gastrointestinal tract, unspecified    Metabolic syndrome    Nontoxic multinodular goiter    Other and unspecified hyperlipidemia    Prostatism     Past Surgical History:  Procedure Laterality Date   CATARACT EXTRACTION     KNEE CARTILAGE SURGERY Right    THYROIDECTOMY      Social History   Socioeconomic History   Marital status: Married    Spouse name: Hilda Blades   Number of children: 3   Years of education: Not on file   Highest education level: Professional school degree (e.g., MD, DDS, DVM, JD)  Occupational History   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  Tobacco Use   Smoking status: Never   Smokeless tobacco: Never  Scientific laboratory technician Use: Never used  Substance and Sexual Activity   Alcohol use: Yes    Comment: rare   Drug use: No   Sexual activity: Not Currently  Other Topics Concern   Not on file  Social History Narrative   Lives home with wife; children live in [place]    Social Determinants of Health   Financial Resource Strain: Low Risk  (09/30/2021)   Overall Financial Resource Strain (CARDIA)    Difficulty of Paying Living Expenses: Not hard at  all  Food Insecurity: No Food Insecurity (09/30/2021)   Hunger Vital Sign    Worried About Running Out of Food in the Last Year: Never true    Ran Out of Food in the Last Year: Never true  Transportation Needs: No Transportation Needs (09/30/2021)   PRAPARE - Hydrologist (Medical): No    Lack of Transportation (Non-Medical): No  Physical Activity: Sufficiently Active (09/30/2021)   Exercise Vital Sign    Days of Exercise per Week: 7 days    Minutes of Exercise per  Session: 60 min  Stress: No Stress Concern Present (09/30/2021)   [place]    Feeling of Stress : Not at all  Social Connections: [place] (09/30/2021)   Social Connection and Isolation Panel [NHANES]    Frequency of Communication with Friends and Family: More than three times a week    Frequency of Social Gatherings with Friends and Family: Once a week    Attends Religious Services: More than 4 times per year    Active Member of [employer] or Organizations: Yes    Attends Music therapist: More than 4 times per year    Marital Status: Married  Human resources officer Violence: Not At Risk (09/30/2021)   Humiliation, Afraid, Rape, and Kick questionnaire    Fear of Current or Ex-Partner: No    Emotionally Abused: No    Physically Abused: No    Sexually Abused: No    Outpatient Encounter Medications as of 02/13/2022  Medication Sig   aspirin 81 MG EC tablet Take 81 mg by mouth daily.     benazepril (LOTENSIN) 20 MG tablet Take 1 tablet (20 mg total) by mouth daily. (NEEDS TO BE SEEN BEFORE NEXT REFILL)   Calcium Carbonate-Vitamin D (CALCIUM + D PO) Take 1 capsule by mouth daily.     Cholecalciferol (VITAMIN D3) 5000 UNITS CAPS Take 1 capsule by mouth daily.     DENTAGEL 1.1 % GEL dental gel PLEASE SEE ATTACHED FOR DETAILED DIRECTIONS   fluticasone (FLONASE) 50 MCG/ACT nasal spray Place 1 spray into both nostrils daily.   levothyroxine (SYNTHROID) 75 MCG tablet TAKE 1 TABLET BY MOUTH EVERY DAY   Omega-3 Fatty Acids (FISH OIL) 1000 MG CAPS Take 1 capsule by mouth 2 (two) times daily.     [DISCONTINUED] atorvastatin (LIPITOR) 20 MG tablet Take 1 tablet (20 mg total) by mouth daily. (NEEDS TO BE SEEN BEFORE NEXT REFILL)   atorvastatin (LIPITOR) 20 MG tablet Take 1 tablet (20 mg total) by mouth daily.   No facility-administered encounter medications on file as of 02/13/2022.    Allergies  Allergen Reactions    Mobic [Meloxicam] Other (See Comments)    Rectal bleeding with only 2 pills     Review of Systems  Constitutional:  Negative for activity change, appetite change, chills, diaphoresis, fatigue, fever and unexpected weight change.  HENT: Negative.    Eyes: Negative.  Negative for photophobia and visual disturbance.  Respiratory:  Negative for cough, chest tightness and shortness of breath.   Cardiovascular:  Negative for chest pain, palpitations and leg swelling.  Gastrointestinal:  Negative for abdominal pain, blood in stool, constipation, diarrhea, nausea and vomiting.  Endocrine: Negative.  Negative for cold intolerance, heat intolerance, polydipsia, polyphagia and polyuria.  Genitourinary:  Negative for decreased urine volume, difficulty urinating, dysuria, frequency and urgency.  Musculoskeletal:  Negative for arthralgias and myalgias.  Skin: Negative.   Allergic/Immunologic:  Negative.   Neurological:  Negative for dizziness, tremors, seizures, syncope, facial asymmetry, speech difficulty, weakness, light-headedness, numbness and headaches.  Hematological: Negative.   Psychiatric/Behavioral:  Negative for confusion, hallucinations, sleep disturbance and suicidal ideas.   All other systems reviewed and are negative.       Objective:  BP 136/80   Pulse 63   Temp (!) 97.2 F (36.2 C) (Temporal)   Ht _0  (1.778 m)   Wt 193 lb 3.2 oz (87.6 kg)   SpO2 99%   BMI 27.72 kg/m    Wt Readings from Last 3 Encounters:  02/13/22 193 lb 3.2 oz (87.6 kg)  09/30/21 208 lb (94.3 kg)  07/04/21 224 lb (101.6 kg)    Physical Exam Vitals and nursing note reviewed.  Constitutional:      General: He is not in acute distress.    Appearance: Normal appearance. He is well-developed, well-groomed and overweight. He is not ill-appearing, toxic-appearing or diaphoretic.  HENT:     Head: Normocephalic and atraumatic.     Jaw: There is normal jaw occlusion.     Right Ear: Hearing normal.      Left Ear: Hearing normal.     Nose: Nose normal.     Mouth/Throat:     Lips: Pink.     Mouth: Mucous membranes are moist.     Pharynx: Oropharynx is clear. Uvula midline.  Eyes:     General: Lids are normal.     Extraocular Movements: Extraocular movements intact.     Conjunctiva/sclera: Conjunctivae normal.     Pupils: Pupils are equal, round, and reactive to light.  Neck:     Vascular: No carotid bruit or JVD.     Trachea: Trachea and phonation normal.     Comments: Thyroidectomy scar well healed Cardiovascular:     Rate and Rhythm: Normal rate and regular rhythm.     Chest Wall: PMI is not displaced.     Pulses: Normal pulses.     Heart sounds: Normal heart sounds. No murmur heard.    No friction rub. No gallop.  Pulmonary:     Effort: Pulmonary effort is normal. No respiratory distress.     Breath sounds: Normal breath sounds. No wheezing.  Abdominal:     General: Bowel sounds are normal. There is no distension or abdominal bruit.     Palpations: Abdomen is soft. There is no hepatomegaly or splenomegaly.     Tenderness: There is no abdominal tenderness. There is no right CVA tenderness or left CVA tenderness.     Hernia: No hernia is present.  Musculoskeletal:        General: Normal range of motion.     Cervical back: Normal range of motion and neck supple.     Right lower leg: No edema.     Left lower leg: No edema.  Lymphadenopathy:     Cervical: No cervical adenopathy.  Skin:    General: Skin is warm and dry.     Capillary Refill: Capillary refill takes less than 2 seconds.     Coloration: Skin is not cyanotic, jaundiced or pale.     Findings: No rash.  Neurological:     General: No focal deficit present.     Mental Status: He is alert and oriented to person, place, and time.     Sensory: Sensation is intact.     Motor: Motor function is intact.     Coordination: Coordination is intact.     Gait: Gait  is intact.     Deep Tendon Reflexes: Reflexes are normal and  symmetric.  Psychiatric:        Attention and Perception: Attention and perception normal.        Mood and Affect: Mood and affect normal.        Speech: Speech normal.        Behavior: Behavior normal. Behavior is cooperative.        Thought Content: Thought content normal.        Cognition and Memory: Cognition and memory normal.        Judgment: Judgment normal.     Results for orders placed or performed in visit on 11/07/21  Urinalysis, Routine w reflex microscopic  Result Value Ref Range   Specific Gravity, UA 1.020 1.005 - 1.030   pH, UA 5.0 5.0 - 7.5   Color, UA Amber (A) Yellow   Appearance Ur Clear Clear   Leukocytes,UA Negative Negative   Protein,UA Negative Negative/Trace   Glucose, UA Negative Negative   Ketones, UA Negative Negative   RBC, UA Negative Negative   Bilirubin, UA Negative Negative   Urobilinogen, Ur 0.2 0.2 - 1.0 mg/dL   Nitrite, UA Negative Negative   Microscopic Examination Comment        Pertinent labs & imaging results that were available during my care of the patient were reviewed by me and considered in my medical decision making.  Assessment & Plan:  Jhase was seen today for establish care and medical management of chronic issues.  Diagnoses and all orders for this visit:  Essential hypertension BP well controlled. Changes were not made in regimen today. Goal BP is 130/80. Pt aware to report any persistent high or low readings. DASH diet and exercise encouraged. Exercise at least 150 minutes per week and increase as tolerated. Goal BMI > 25. Stress management encouraged. Avoid nicotine and tobacco product use. Avoid excessive alcohol and NSAID's. Avoid more than 2000 mg of sodium daily. Medications as prescribed. Follow up as scheduled.  -     CBC with Differential/Platelet -     CMP14+EGFR -     Lipid panel -     Thyroid Panel With TSH -     VITAMIN D 25 Hydroxy (Vit-D Deficiency, Fractures)  Mixed hyperlipidemia Diet encouraged -  increase intake of fresh fruits and vegetables, increase intake of lean proteins. Bake, broil, or grill foods. Avoid fried, greasy, and fatty foods. Avoid fast foods. Increase intake of fiber-rich whole grains. Exercise encouraged - at least 150 minutes per week and advance as tolerated. Goal BMI < 25. Continue medications as prescribed. Follow up in 3-6 months as discussed.  -     atorvastatin (LIPITOR) 20 MG tablet; Take 1 tablet (20 mg total) by mouth daily. -     CMP14+EGFR -     Lipid panel  Postoperative hypothyroidism Thyroid disease has been well controlled. Labs are pending. Adjustments to regimen will be made if warranted. Make sure to take medications on an empty stomach with a full glass of water. Make sure to avoid vitamins or supplements for at least 4 hours before and 4 hours after taking medications. Repeat labs in 3 months if adjustments are made and in 6 months if stable.   -     Thyroid Panel With TSH  Vitamin D deficiency Labs pending. Continue repletion therapy. If indicated, will change repletion dosage. Eat foods rich in Vit D including milk, orange juice, yogurt with vitamin D added,  salmon or mackerel, canned tuna fish, cereals with vitamin D added, and cod liver oil. Get out in the sun but make sure to wear at least SPF 30 sunscreen.  -     VITAMIN D 25 Hydroxy (Vit-D Deficiency, Fractures)     Continue all other maintenance medications.  Follow up plan: Return if symptoms worsen or fail to improve.   Continue healthy lifestyle choices, including diet (rich in fruits, vegetables, and lean proteins, and low in salt and simple carbohydrates) and exercise (at least 30 minutes of moderate physical activity daily).  Educational handout given for health maintenance  The above assessment and management plan was discussed with the patient. The patient verbalized understanding of and has agreed to the management plan. Patient is aware to call the clinic if they develop any  new symptoms or if symptoms persist or worsen. Patient is aware when to return to the clinic for a follow-up visit. Patient educated on when it is appropriate to go to the emergency department.   Monia Pouch, FNP-C [facility] [phone]

## 2022-02-14 LAB — CBC WITH DIFFERENTIAL/PLATELET
Basophils Absolute: 0 10*3/uL (ref 0.0–0.2)
Basos: 1 %
EOS (ABSOLUTE): 0.1 10*3/uL (ref 0.0–0.4)
Eos: 3 %
Hematocrit: 43.5 % (ref 37.5–51.0)
Hemoglobin: 14.5 g/dL (ref 13.0–17.7)
Immature Grans (Abs): 0 10*3/uL (ref 0.0–0.1)
Immature Granulocytes: 1 %
Lymphocytes Absolute: 1.4 10*3/uL (ref 0.7–3.1)
Lymphs: 29 %
MCH: 31.2 pg (ref 26.6–33.0)
MCHC: 33.3 g/dL (ref 31.5–35.7)
MCV: 94 fL (ref 79–97)
Monocytes Absolute: 0.5 10*3/uL (ref 0.1–0.9)
Monocytes: 10 %
Neutrophils Absolute: 2.7 10*3/uL (ref 1.4–7.0)
Neutrophils: 56 %
Platelets: 310 10*3/uL (ref 150–450)
RBC: 4.65 x10E6/uL (ref 4.14–5.80)
RDW: 12.1 % (ref 11.6–15.4)
WBC: 4.7 10*3/uL (ref 3.4–10.8)

## 2022-02-14 LAB — THYROID PANEL WITH TSH
Free Thyroxine Index: 3.1 (ref 1.2–4.9)
T3 Uptake Ratio: 35 % (ref 24–39)
T4, Total: 8.9 ug/dL (ref 4.5–12.0)
TSH: 0.911 u[IU]/mL (ref 0.450–4.500)

## 2022-02-14 LAB — CMP14+EGFR
ALT: 24 IU/L (ref 0–44)
AST: 26 IU/L (ref 0–40)
Albumin/Globulin Ratio: 1.5 (ref 1.2–2.2)
Albumin: 4 g/dL (ref 3.8–4.8)
Alkaline Phosphatase: 71 IU/L (ref 44–121)
BUN/Creatinine Ratio: 16 (ref 10–24)
BUN: 16 mg/dL (ref 8–27)
Bilirubin Total: 0.6 mg/dL (ref 0.0–1.2)
CO2: 25 mmol/L (ref 20–29)
Calcium: 9.5 mg/dL (ref 8.6–10.2)
Chloride: 102 mmol/L (ref 96–106)
Creatinine, Ser: 0.98 mg/dL (ref 0.76–1.27)
Globulin, Total: 2.7 g/dL (ref 1.5–4.5)
Glucose: 95 mg/dL (ref 70–99)
Potassium: 4.6 mmol/L (ref 3.5–5.2)
Sodium: 140 mmol/L (ref 134–144)
Total Protein: 6.7 g/dL (ref 6.0–8.5)
eGFR: 81 mL/min/{1.73_m2} (ref >59)

## 2022-02-14 LAB — LIPID PANEL
Chol/HDL Ratio: 2.7 ratio (ref 0.0–5.0)
Cholesterol, Total: 159 mg/dL (ref 100–199)
HDL: 58 mg/dL (ref >39)
LDL Chol Calc (NIH): 91 mg/dL (ref 0–99)
Triglycerides: 50 mg/dL (ref 0–149)
VLDL Cholesterol Cal: 10 mg/dL (ref 5–40)

## 2022-02-14 LAB — VITAMIN D 25 HYDROXY (VIT D DEFICIENCY, FRACTURES): Vit D, 25-Hydroxy: 77.9 ng/mL (ref 30.0–100.0)

## 2022-02-21 ENCOUNTER — Other Ambulatory Visit: Payer: Self-pay | Admitting: Family Medicine

## 2022-02-21 DIAGNOSIS — I10 Essential (primary) hypertension: Secondary | ICD-10-CM

## 2022-04-22 IMAGING — DX DG CHEST 2V
2 series · 2 of 2 positions shown · non-contrast
Comparison: 07/17/2017

CLINICAL DATA: Cough

EXAM:
CHEST - 2 VIEW

[chest pa]
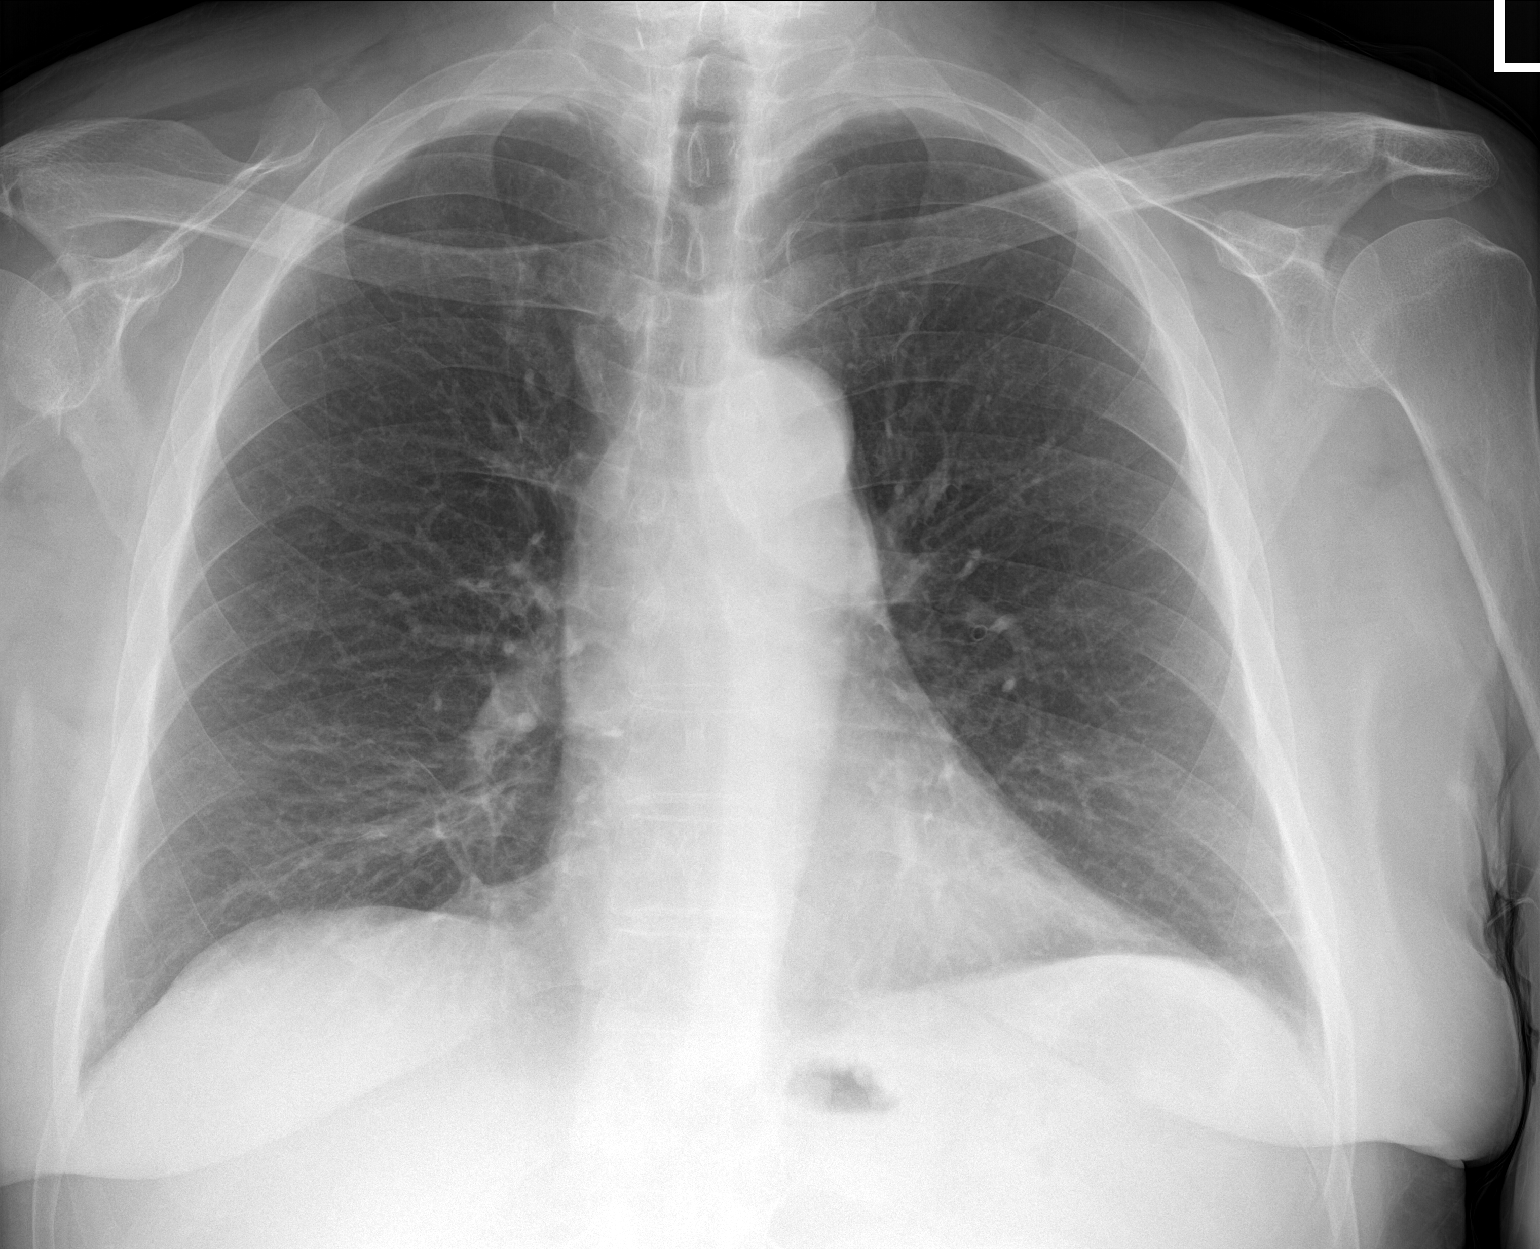

[chest lat]
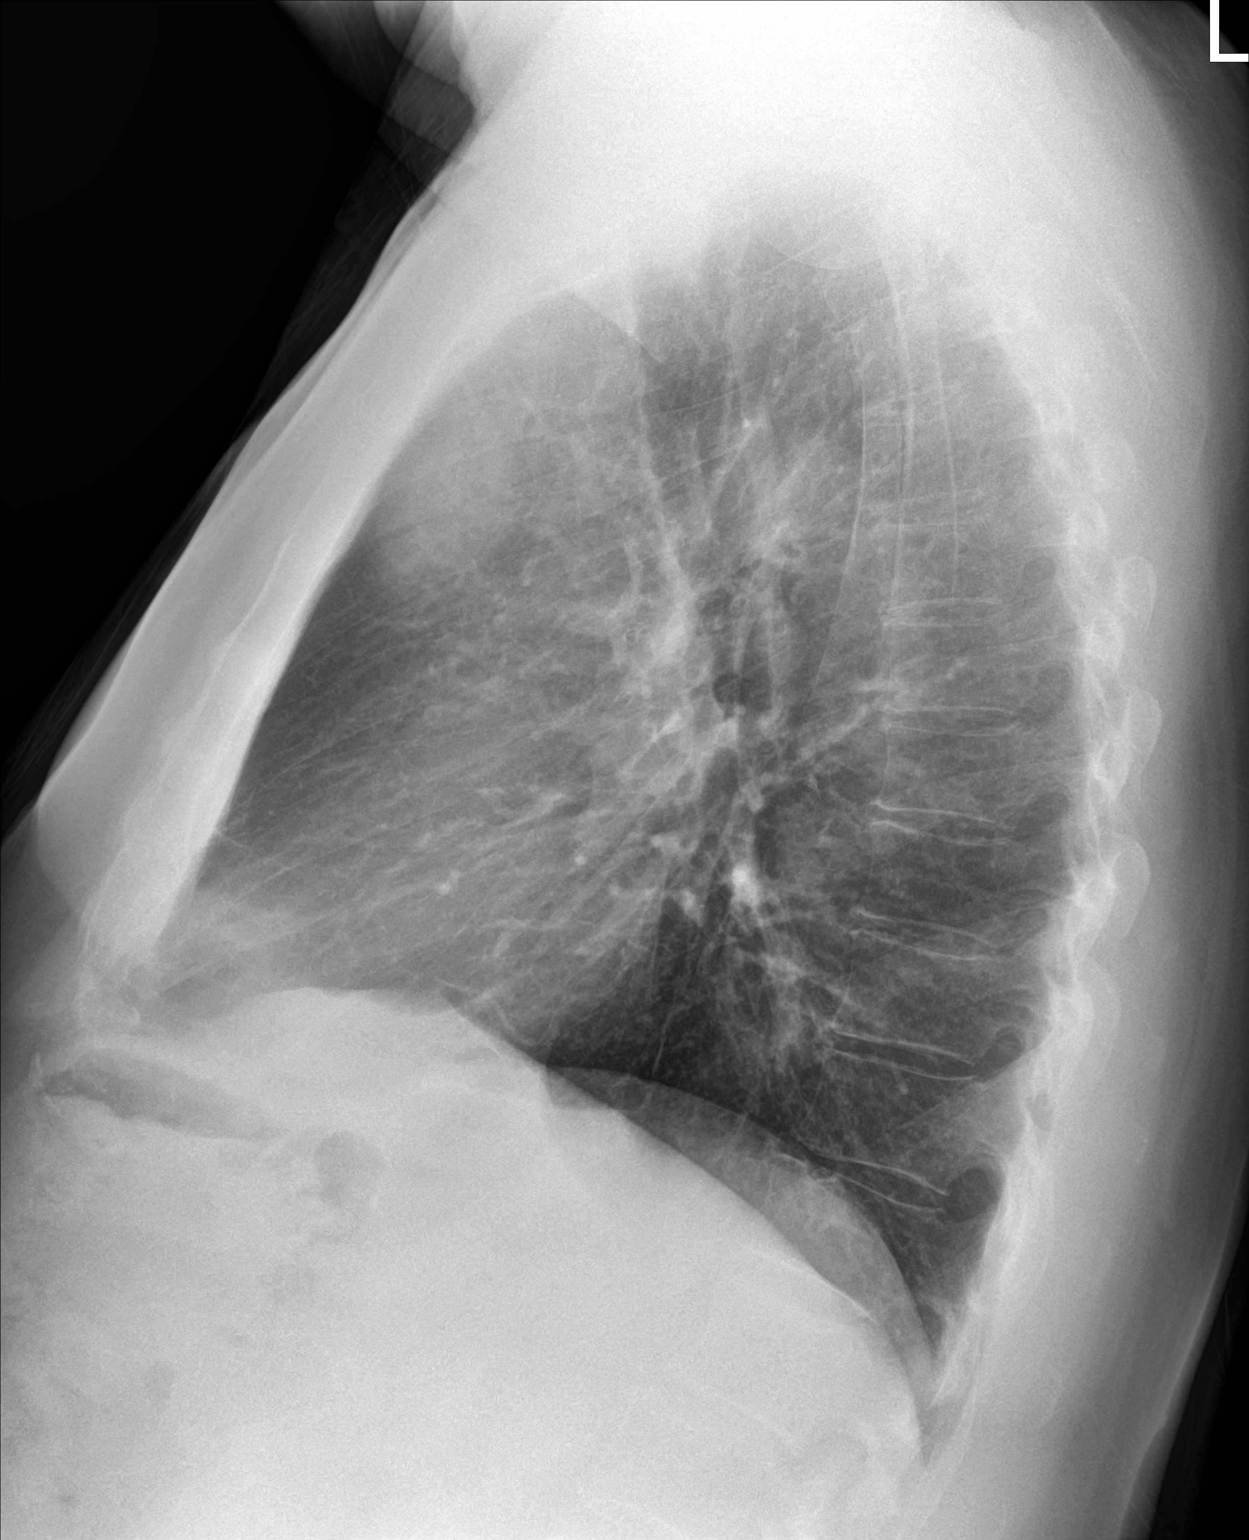

[2 of 2 positions shown; findings below may reference images not displayed]

FINDINGS: The heart size and mediastinal contours are within normal limits.
Both lungs are clear. The visualized skeletal structures are
unremarkable.
IMPRESSION: No active cardiopulmonary disease.

## 2022-05-13 ENCOUNTER — Other Ambulatory Visit: Payer: Self-pay | Admitting: Family Medicine

## 2022-05-13 DIAGNOSIS — J301 Allergic rhinitis due to pollen: Secondary | ICD-10-CM

## 2022-06-29 ENCOUNTER — Other Ambulatory Visit: Payer: Self-pay | Admitting: Family Medicine

## 2022-06-29 DIAGNOSIS — E890 Postprocedural hypothyroidism: Secondary | ICD-10-CM

## 2022-08-08 ENCOUNTER — Other Ambulatory Visit: Payer: Self-pay | Admitting: Family Medicine

## 2022-08-08 DIAGNOSIS — E782 Mixed hyperlipidemia: Secondary | ICD-10-CM

## 2022-08-15 ENCOUNTER — Other Ambulatory Visit: Payer: Self-pay | Admitting: Family Medicine

## 2022-08-15 DIAGNOSIS — I10 Essential (primary) hypertension: Secondary | ICD-10-CM

## 2022-09-14 ENCOUNTER — Other Ambulatory Visit: Payer: Self-pay | Admitting: Family Medicine

## 2022-09-14 DIAGNOSIS — I10 Essential (primary) hypertension: Secondary | ICD-10-CM

## 2022-09-15 NOTE — Telephone Encounter (Signed)
Rakes pt NTBS 30-d given 08/15/22

## 2022-09-15 NOTE — Telephone Encounter (Signed)
Made appt June 11 and he has enough meds to last until then

## 2022-09-18 ENCOUNTER — Other Ambulatory Visit: Payer: Self-pay | Admitting: Family Medicine

## 2022-09-18 DIAGNOSIS — I10 Essential (primary) hypertension: Secondary | ICD-10-CM

## 2022-09-23 ENCOUNTER — Encounter: Payer: Self-pay | Admitting: Family Medicine

## 2022-09-23 ENCOUNTER — Ambulatory Visit (INDEPENDENT_AMBULATORY_CARE_PROVIDER_SITE_OTHER): Payer: Medicare HMO | Admitting: Family Medicine

## 2022-09-23 VITALS — BP 139/74 | HR 63 | Temp 97.3°F | Ht 70.0 in | Wt 196.0 lb

## 2022-09-23 DIAGNOSIS — N401 Enlarged prostate with lower urinary tract symptoms: Secondary | ICD-10-CM

## 2022-09-23 DIAGNOSIS — I10 Essential (primary) hypertension: Secondary | ICD-10-CM | POA: Diagnosis not present

## 2022-09-23 DIAGNOSIS — E782 Mixed hyperlipidemia: Secondary | ICD-10-CM

## 2022-09-23 DIAGNOSIS — E890 Postprocedural hypothyroidism: Secondary | ICD-10-CM | POA: Diagnosis not present

## 2022-09-23 DIAGNOSIS — Z125 Encounter for screening for malignant neoplasm of prostate: Secondary | ICD-10-CM

## 2022-09-23 DIAGNOSIS — N138 Other obstructive and reflux uropathy: Secondary | ICD-10-CM

## 2022-09-23 MED ORDER — ATORVASTATIN CALCIUM 20 MG PO TABS: 20.0000 mg | ORAL_TABLET | Freq: Every day | ORAL | 1 refills | Status: DC

## 2022-09-23 MED ORDER — BENAZEPRIL HCL 20 MG PO TABS: 20.0000 mg | ORAL_TABLET | Freq: Every day | ORAL | 1 refills | Status: DC

## 2022-09-23 MED ORDER — LEVOTHYROXINE SODIUM 75 MCG PO TABS: 75.0000 ug | ORAL_TABLET | Freq: Every day | ORAL | 1 refills | Status: DC

## 2022-09-23 NOTE — Progress Notes (Signed)
Subjective:  Patient ID: Shawn Ochoa, male    DOB: 1950/09/19, 72 y.o.   MRN: [id number]  Patient Care Team: Sonny Masters, FNP as PCP - General (Family Medicine) [facility] Bjorn Pippin, MD as Attending Physician (Urology) Wynona Canes, MD as Referring Physician (Dermatology)   Chief Complaint:  Medical Management of Chronic Issues (Chronic check up )   HPI: Shawn Ochoa is a 72 y.o. male presenting on 09/23/2022 for Medical Management of Chronic Issues (Chronic check up )   1. Mixed hyperlipidemia Compliant with medications - Yes Current medications - atorvastatin Side effects from medications - No Diet - generally healthy  Exercise - not regular   2. Essential hypertension Complaint with meds - Yes Current Medications - benazepril Checking BP at home - no Exercising Regularly - No Watching Salt intake - Yes Pertinent ROS:  Headache - No Fatigue - No Visual Disturbances - No Chest pain - No Dyspnea - No Palpitations - No LE edema - No They report good compliance with medications and can restate their regimen by memory. No medication side effects.  BP Readings from Last 3 Encounters:  09/23/22 139/74  02/13/22 136/80  11/07/21 127/76     3. Postoperative hypothyroidism Compliant with medications - Yes Current medications - levothyroxine 75 mcg Adverse side effects - No Weight - stable  Bowel habit changes - No Heat or cold intolerance - No Mood changes - No Changes in sleep habits - No Fatigue - No Skin, hair, or nail changes - No Tremor - No Palpitations - No Edema - No Shortness of breath - No Changes in menses - No   4. BPH with urinary obstruction States he gets up 1-2 times per night, does not feel he needs to start medications at this time.      Relevant past medical, surgical, family, and social history reviewed and updated as indicated.  Allergies and medications reviewed and updated. Data  reviewed: Chart in Epic.   Past Medical History:  Diagnosis Date   Adenomatous polyp    Cardiac dysrhythmia, unspecified    Cataract    Essential hypertension, benign    Hemorrhage of gastrointestinal tract, unspecified    Metabolic syndrome    Nontoxic multinodular goiter    Other and unspecified hyperlipidemia    Prostatism     Past Surgical History:  Procedure Laterality Date   CATARACT EXTRACTION     KNEE CARTILAGE SURGERY Right    THYROIDECTOMY      Social History   Socioeconomic History   Marital status: Married    Spouse name: Stanton Kidney   Number of children: 3   Years of education: Not on file   Highest education level: Professional school degree (e.g., MD, DDS, DVM, JD)  Occupational History   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  Tobacco Use   Smoking status: Never   Smokeless tobacco: Never  Building services engineer Use: Never used  Substance and Sexual Activity   Alcohol use: Yes    Comment: rare   Drug use: No   Sexual activity: Not Currently  Other Topics Concern   Not on file  Social History Narrative   Lives home with wife; children live in [place]    Social Determinants of Health   Financial Resource Strain: Low Risk  (09/30/2021)   Overall Financial Resource Strain (CARDIA)    Difficulty of Paying Living Expenses: Not hard at all  Food  Insecurity: No Food Insecurity (09/30/2021)   Hunger Vital Sign    Worried About Running Out of Food in the Last Year: Never true    Ran Out of Food in the Last Year: Never true  Transportation Needs: No Transportation Needs (09/30/2021)   PRAPARE - Administrator, Civil Service (Medical): No    Lack of Transportation (Non-Medical): No  Physical Activity: Sufficiently Active (09/30/2021)   Exercise Vital Sign    Days of Exercise per Week: 7 days    Minutes of Exercise per Session: 60 min  Stress: No Stress Concern Present (09/30/2021)   [employer] of Occupational Health -  Occupational Stress Questionnaire    Feeling of Stress : Not at all  Social Connections: Socially Integrated (09/30/2021)   Social Connection and Isolation Panel [NHANES]    Frequency of Communication with Friends and Family: More than three times a week    Frequency of Social Gatherings with Friends and Family: Once a week    Attends Religious Services: More than 4 times per year    Active Member of [employer] or Organizations: Yes    Attends Engineer, structural: More than 4 times per year    Marital Status: Married  Catering manager Violence: Not At Risk (09/30/2021)   Humiliation, Afraid, Rape, and Kick questionnaire    Fear of Current or Ex-Partner: No    Emotionally Abused: No    Physically Abused: No    Sexually Abused: No    Outpatient Encounter Medications as of 09/23/2022  Medication Sig   aspirin 81 MG EC tablet Take 81 mg by mouth daily.     Calcium Carbonate-Vitamin D (CALCIUM + D PO) Take 1 capsule by mouth daily.     Cholecalciferol (VITAMIN D3) 5000 UNITS CAPS Take 1 capsule by mouth daily.     DENTAGEL 1.1 % GEL dental gel PLEASE SEE ATTACHED FOR DETAILED DIRECTIONS   fluticasone (FLONASE) 50 MCG/ACT nasal spray SPRAY 1 SPRAY INTO BOTH NOSTRILS DAILY.   Omega-3 Fatty Acids (FISH OIL) 1000 MG CAPS Take 1 capsule by mouth 2 (two) times daily.     [DISCONTINUED] atorvastatin (LIPITOR) 20 MG tablet TAKE 1 TABLET BY MOUTH EVERY DAY   [DISCONTINUED] benazepril (LOTENSIN) 20 MG tablet Take 1 tablet (20 mg total) by mouth daily.   [DISCONTINUED] levothyroxine (SYNTHROID) 75 MCG tablet TAKE 1 TABLET BY MOUTH EVERY DAY   atorvastatin (LIPITOR) 20 MG tablet Take 1 tablet (20 mg total) by mouth daily.   benazepril (LOTENSIN) 20 MG tablet Take 1 tablet (20 mg total) by mouth daily.   levothyroxine (SYNTHROID) 75 MCG tablet Take 1 tablet (75 mcg total) by mouth daily.   No facility-administered encounter medications on file as of 09/23/2022.    Allergies  Allergen Reactions    Mobic [Meloxicam] Other (See Comments)    Rectal bleeding with only 2 pills     Review of Systems  Constitutional:  Negative for activity change, appetite change, chills, diaphoresis, fatigue, fever and unexpected weight change.  HENT: Negative.    Eyes: Negative.  Negative for photophobia and visual disturbance.  Respiratory:  Negative for cough, chest tightness and shortness of breath.   Cardiovascular:  Negative for chest pain, palpitations and leg swelling.  Gastrointestinal:  Negative for abdominal pain, blood in stool, constipation, diarrhea, nausea and vomiting.  Endocrine: Negative.  Negative for cold intolerance, heat intolerance, polydipsia, polyphagia and polyuria.  Genitourinary:  Negative for decreased urine volume, difficulty urinating, dysuria, frequency and  urgency.       Nocturia 1-2 times per night  Musculoskeletal:  Negative for arthralgias and myalgias.  Skin: Negative.   Allergic/Immunologic: Negative.   Neurological:  Negative for dizziness and headaches.  Hematological: Negative.   Psychiatric/Behavioral:  Negative for confusion, hallucinations, sleep disturbance and suicidal ideas.   All other systems reviewed and are negative.       Objective:  BP 139/74   Pulse 63   Temp (!) 97.3 F (36.3 C) (Temporal)   Ht 5\' 10"  (1.778 m)   Wt 196 lb (88.9 kg)   SpO2 99%   BMI 28.12 kg/m    Wt Readings from Last 3 Encounters:  09/23/22 196 lb (88.9 kg)  02/13/22 193 lb 3.2 oz (87.6 kg)  09/30/21 208 lb (94.3 kg)    Physical Exam Vitals and nursing note reviewed.  Constitutional:      General: He is not in acute distress.    Appearance: Normal appearance. He is well-developed, well-groomed and overweight. He is not ill-appearing, toxic-appearing or diaphoretic.  HENT:     Head: Normocephalic and atraumatic.     Jaw: There is normal jaw occlusion.     Right Ear: Hearing normal.     Left Ear: Hearing normal.     Nose: Nose normal.     Mouth/Throat:      Lips: Pink.     Mouth: Mucous membranes are moist.     Pharynx: Oropharynx is clear. Uvula midline.  Eyes:     General: Lids are normal.     Extraocular Movements: Extraocular movements intact.     Conjunctiva/sclera: Conjunctivae normal.     Pupils: Pupils are equal, round, and reactive to light.  Neck:     Thyroid: No thyroid mass, thyromegaly or thyroid tenderness.     Vascular: No carotid bruit or JVD.     Trachea: Trachea and phonation normal.     Comments: Thyroidectomy scar well healed. Cardiovascular:     Rate and Rhythm: Normal rate and regular rhythm.     Chest Wall: PMI is not displaced.     Pulses: Normal pulses.     Heart sounds: Normal heart sounds. No murmur heard.    No friction rub. No gallop.  Pulmonary:     Effort: Pulmonary effort is normal. No respiratory distress.     Breath sounds: Normal breath sounds. No wheezing.  Abdominal:     General: Bowel sounds are normal. There is no distension or abdominal bruit.     Palpations: Abdomen is soft. There is no hepatomegaly or splenomegaly.     Tenderness: There is no abdominal tenderness. There is no right CVA tenderness or left CVA tenderness.     Hernia: No hernia is present.  Musculoskeletal:        General: Normal range of motion.     Cervical back: Normal range of motion and neck supple.     Right lower leg: No edema.     Left lower leg: No edema.  Lymphadenopathy:     Cervical: No cervical adenopathy.  Skin:    General: Skin is warm and dry.     Capillary Refill: Capillary refill takes less than 2 seconds.     Coloration: Skin is not cyanotic, jaundiced or pale.     Findings: No rash.  Neurological:     General: No focal deficit present.     Mental Status: He is alert and oriented to person, place, and time.     Sensory:  Sensation is intact.     Motor: Motor function is intact.     Coordination: Coordination is intact.     Gait: Gait is intact.     Deep Tendon Reflexes: Reflexes are normal and  symmetric.  Psychiatric:        Attention and Perception: Attention and perception normal.        Mood and Affect: Mood and affect normal.        Speech: Speech normal.        Behavior: Behavior normal. Behavior is cooperative.        Thought Content: Thought content normal.        Cognition and Memory: Cognition and memory normal.        Judgment: Judgment normal.     Results for orders placed or performed in visit on 02/13/22  CBC with Differential/Platelet  Result Value Ref Range   WBC 4.7 3.4 - 10.8 x10E3/uL   RBC 4.65 4.14 - 5.80 x10E6/uL   Hemoglobin 14.5 13.0 - 17.7 g/dL   Hematocrit 16.1 09.6 - 51.0 %   MCV 94 79 - 97 fL   MCH 31.2 26.6 - 33.0 pg   MCHC 33.3 31.5 - 35.7 g/dL   RDW 04.5 40.9 - 81.1 %   Platelets 310 150 - 450 x10E3/uL   Neutrophils 56 Not Estab. %   Lymphs 29 Not Estab. %   Monocytes 10 Not Estab. %   Eos 3 Not Estab. %   Basos 1 Not Estab. %   Neutrophils Absolute 2.7 1.4 - 7.0 x10E3/uL   Lymphocytes Absolute 1.4 0.7 - 3.1 x10E3/uL   Monocytes Absolute 0.5 0.1 - 0.9 x10E3/uL   EOS (ABSOLUTE) 0.1 0.0 - 0.4 x10E3/uL   Basophils Absolute 0.0 0.0 - 0.2 x10E3/uL   Immature Granulocytes 1 Not Estab. %   Immature Grans (Abs) 0.0 0.0 - 0.1 x10E3/uL  CMP14+EGFR  Result Value Ref Range   Glucose 95 70 - 99 mg/dL   BUN 16 8 - 27 mg/dL   Creatinine, Ser 9.14 0.76 - 1.27 mg/dL   eGFR 81 >78 GN/FAO/1.30   BUN/Creatinine Ratio 16 10 - 24   Sodium 140 134 - 144 mmol/L   Potassium 4.6 3.5 - 5.2 mmol/L   Chloride 102 96 - 106 mmol/L   CO2 25 20 - 29 mmol/L   Calcium 9.5 8.6 - 10.2 mg/dL   Total Protein 6.7 6.0 - 8.5 g/dL   Albumin 4.0 3.8 - 4.8 g/dL   Globulin, Total 2.7 1.5 - 4.5 g/dL   Albumin/Globulin Ratio 1.5 1.2 - 2.2   Bilirubin Total 0.6 0.0 - 1.2 mg/dL   Alkaline Phosphatase 71 44 - 121 IU/L   AST 26 0 - 40 IU/L   ALT 24 0 - 44 IU/L  Lipid panel  Result Value Ref Range   Cholesterol, Total 159 100 - 199 mg/dL   Triglycerides 50 0 - 149 mg/dL    HDL 58 >86 mg/dL   VLDL Cholesterol Cal 10 5 - 40 mg/dL   LDL Chol Calc (NIH) 91 0 - 99 mg/dL   Chol/HDL Ratio 2.7 0.0 - 5.0 ratio  Thyroid Panel With TSH  Result Value Ref Range   TSH 0.911 0.450 - 4.500 uIU/mL   T4, Total 8.9 4.5 - 12.0 ug/dL   T3 Uptake Ratio 35 24 - 39 %   Free Thyroxine Index 3.1 1.2 - 4.9  VITAMIN D 25 Hydroxy (Vit-D Deficiency, Fractures)  Result Value Ref Range  Vit D, 25-Hydroxy 77.9 30.0 - 100.0 ng/mL       Pertinent labs & imaging results that were available during my care of the patient were reviewed by me and considered in my medical decision making.  Assessment & Plan:  Khole was seen today for medical management of chronic issues.  Diagnoses and all orders for this visit:  Mixed hyperlipidemia Diet encouraged - increase intake of fresh fruits and vegetables, increase intake of lean proteins. Bake, broil, or grill foods. Avoid fried, greasy, and fatty foods. Avoid fast foods. Increase intake of fiber-rich whole grains. Exercise encouraged - at least 150 minutes per week and advance as tolerated.  Goal BMI < 25. Continue medications as prescribed. Follow up in 3-6 months as discussed.  -     atorvastatin (LIPITOR) 20 MG tablet; Take 1 tablet (20 mg total) by mouth daily. -     CMP14+EGFR -     Lipid panel  Essential hypertension BP well controlled. Changes were not made in regimen today. Goal BP is 130/80. Pt aware to report any persistent high or low readings. DASH diet and exercise encouraged. Exercise at least 150 minutes per week and increase as tolerated. Goal BMI > 25. Stress management encouraged. Avoid nicotine and tobacco product use. Avoid excessive alcohol and NSAID's. Avoid more than 2000 mg of sodium daily. Medications as prescribed. Follow up as scheduled.  -     benazepril (LOTENSIN) 20 MG tablet; Take 1 tablet (20 mg total) by mouth daily. -     CBC with Differential/Platelet -     CMP14+EGFR -     Lipid panel -     Thyroid Panel  With TSH  Postoperative hypothyroidism Will repeat labs today and adjust regimen if warranted.  -     levothyroxine (SYNTHROID) 75 MCG tablet; Take 1 tablet (75 mcg total) by mouth daily. -     Thyroid Panel With TSH  Screening for prostate cancer BPH with urinary obstruction Will check labs today and referral to urology if warranted.  -     PSA, total and free     Continue all other maintenance medications.  Follow up plan: Return in about 6 months (around 03/25/2023), or if symptoms worsen or fail to improve, for CPE.   Continue healthy lifestyle choices, including diet (rich in fruits, vegetables, and lean proteins, and low in salt and simple carbohydrates) and exercise (at least 30 minutes of moderate physical activity daily).  Educational handout given for health maintenance   The above assessment and management plan was discussed with the patient. The patient verbalized understanding of and has agreed to the management plan. Patient is aware to call the clinic if they develop any new symptoms or if symptoms persist or worsen. Patient is aware when to return to the clinic for a follow-up visit. Patient educated on when it is appropriate to go to the emergency department.   Kari Baars, FNP-C [facility] [phone]

## 2022-09-24 LAB — CBC WITH DIFFERENTIAL/PLATELET
Basophils Absolute: 0 10*3/uL (ref 0.0–0.2)
Basos: 1 %
EOS (ABSOLUTE): 0.2 10*3/uL (ref 0.0–0.4)
Eos: 4 %
Hematocrit: 40.1 % (ref 37.5–51.0)
Hemoglobin: 13.1 g/dL (ref 13.0–17.7)
Immature Grans (Abs): 0 10*3/uL (ref 0.0–0.1)
Immature Granulocytes: 0 %
Lymphocytes Absolute: 1.4 10*3/uL (ref 0.7–3.1)
Lymphs: 30 %
MCH: 31 pg (ref 26.6–33.0)
MCHC: 32.7 g/dL (ref 31.5–35.7)
MCV: 95 fL (ref 79–97)
Monocytes Absolute: 0.5 10*3/uL (ref 0.1–0.9)
Monocytes: 11 %
Neutrophils Absolute: 2.6 10*3/uL (ref 1.4–7.0)
Neutrophils: 54 %
Platelets: 281 10*3/uL (ref 150–450)
RBC: 4.23 x10E6/uL (ref 4.14–5.80)
RDW: 11.9 % (ref 11.6–15.4)
WBC: 4.8 10*3/uL (ref 3.4–10.8)

## 2022-09-24 LAB — LIPID PANEL
Chol/HDL Ratio: 2.4 ratio (ref 0.0–5.0)
Cholesterol, Total: 140 mg/dL (ref 100–199)
HDL: 58 mg/dL (ref >39)
LDL Chol Calc (NIH): 70 mg/dL (ref 0–99)
Triglycerides: 57 mg/dL (ref 0–149)
VLDL Cholesterol Cal: 12 mg/dL (ref 5–40)

## 2022-09-24 LAB — PSA, TOTAL AND FREE
PSA, Free Pct: 19.6 %
PSA, Free: 0.98 ng/mL
Prostate Specific Ag, Serum: 5 ng/mL — ABNORMAL HIGH (ref 0.0–4.0)

## 2022-09-24 LAB — CMP14+EGFR
ALT: 27 IU/L (ref 0–44)
AST: 26 IU/L (ref 0–40)
Albumin/Globulin Ratio: 1.7
Albumin: 4 g/dL (ref 3.8–4.8)
Alkaline Phosphatase: 63 IU/L (ref 44–121)
BUN/Creatinine Ratio: 19 (ref 10–24)
BUN: 22 mg/dL (ref 8–27)
Bilirubin Total: 1 mg/dL (ref 0.0–1.2)
CO2: 26 mmol/L (ref 20–29)
Calcium: 9.7 mg/dL (ref 8.6–10.2)
Chloride: 105 mmol/L (ref 96–106)
Creatinine, Ser: 1.14 mg/dL (ref 0.76–1.27)
Globulin, Total: 2.4 g/dL (ref 1.5–4.5)
Glucose: 95 mg/dL (ref 70–99)
Potassium: 4.3 mmol/L (ref 3.5–5.2)
Sodium: 142 mmol/L (ref 134–144)
Total Protein: 6.4 g/dL (ref 6.0–8.5)
eGFR: 68 mL/min/{1.73_m2} (ref >59)

## 2022-09-24 LAB — THYROID PANEL WITH TSH
Free Thyroxine Index: 3 (ref 1.2–4.9)
T3 Uptake Ratio: 36 % (ref 24–39)
T4, Total: 8.4 ug/dL (ref 4.5–12.0)
TSH: 0.893 u[IU]/mL (ref 0.450–4.500)

## 2022-10-02 ENCOUNTER — Ambulatory Visit (INDEPENDENT_AMBULATORY_CARE_PROVIDER_SITE_OTHER): Payer: Medicare HMO

## 2022-10-02 VITALS — Ht 70.0 in | Wt 192.0 lb

## 2022-10-02 DIAGNOSIS — Z0000 Encounter for general adult medical examination without abnormal findings: Secondary | ICD-10-CM

## 2022-10-02 NOTE — Patient Instructions (Signed)
Shawn Ochoa , Thank you for taking time to come for your Medicare Wellness Visit. I appreciate your ongoing commitment to your health goals. Please review the following plan we discussed and let me know if I can assist you in the future.   These are the goals we discussed:  Goals      Client will verbalize understanding of resources for managing BMI      His goal is 7000 steps per day     Have 3 meals a day     Eat 3 healthy meals daily that consist of lean proteins, fresh fruits and vegetables.         This is a list of the screening recommended for you and due dates:  Health Maintenance  Topic Date Due   Flu Shot  11/13/2022   Medicare Annual Wellness Visit  10/02/2023   Colon Cancer Screening  01/01/2025   DTaP/Tdap/Td vaccine (4 - Td or Tdap) 01/28/2028   Pneumonia Vaccine  Completed   Hepatitis C Screening  Completed   Zoster (Shingles) Vaccine  Completed   HPV Vaccine  Aged Out   Stool Blood Test  Discontinued   COVID-19 Vaccine  Discontinued    Advanced directives: Please bring a copy of your health care power of attorney and living will to the office to be added to your chart at your convenience.   Conditions/risks identified: Aim for 30 minutes of exercise or brisk walking, 6-8 glasses of water, and 5 servings of fruits and vegetables each day.   Next appointment: Follow up in one year for your annual wellness visit    Preventive Care 65 Years and Older, Male Preventive care refers to lifestyle choices and visits with your health care provider that can promote health and wellness. What does preventive care include? A yearly physical exam. This is also called an annual well check. Dental exams once or twice a year. Routine eye exams. Ask your health care provider how often you should have your eyes checked. Personal lifestyle choices, including: Daily care of your teeth and gums. Regular physical activity. Eating a healthy diet. Avoiding tobacco and drug  use. Limiting alcohol use. Practicing safe sex. Taking low-dose aspirin every day. Taking vitamin and mineral supplements as recommended by your health care provider. What happens during an annual well check? The services and screenings done by your health care provider during your annual well check will depend on your age, overall health, lifestyle risk factors, and family history of disease. Counseling  Your health care provider may ask you questions about your: Alcohol use. Tobacco use. Drug use. Emotional well-being. Home and relationship well-being. Sexual activity. Eating habits. History of falls. Memory and ability to understand (cognition). Work and work Astronomer. Reproductive health. Screening  You may have the following tests or measurements: Height, weight, and BMI. Blood pressure. Lipid and cholesterol levels. These may be checked every 5 years, or more frequently if you are over 14 years old. Skin check. Lung cancer screening. You may have this screening every year starting at age 64 if you have a 30-pack-year history of smoking and currently smoke or have quit within the past 15 years. Fecal occult blood test (FOBT) of the stool. You may have this test every year starting at age 80. Flexible sigmoidoscopy or colonoscopy. You may have a sigmoidoscopy every 5 years or a colonoscopy every 10 years starting at age 31. Hepatitis C blood test. Hepatitis B blood test. Sexually transmitted disease (STD) testing. Diabetes screening.  This is done by checking your blood sugar (glucose) after you have not eaten for a while (fasting). You may have this done every 1-3 years. Bone density scan. This is done to screen for osteoporosis. You may have this done starting at age 48. Mammogram. This may be done every 1-2 years. Talk to your health care provider about how often you should have regular mammograms. Talk with your health care provider about your test results, treatment  options, and if necessary, the need for more tests. Vaccines  Your health care provider may recommend certain vaccines, such as: Influenza vaccine. This is recommended every year. Tetanus, diphtheria, and acellular pertussis (Tdap, Td) vaccine. You may need a Td booster every 10 years. Zoster vaccine. You may need this after age 34. Pneumococcal 13-valent conjugate (PCV13) vaccine. One dose is recommended after age 69. Pneumococcal polysaccharide (PPSV23) vaccine. One dose is recommended after age 53. Talk to your health care provider about which screenings and vaccines you need and how often you need them. This information is not intended to replace advice given to you by your health care provider. Make sure you discuss any questions you have with your health care provider. Document Released: 04/27/2015 Document Revised: 12/19/2015 Document Reviewed: 01/30/2015 Elsevier Interactive Patient Education  2017 [place] Prevention in the Home Falls can cause injuries. They can happen to people of all ages. There are many things you can do to make your home safe and to help prevent falls. What can I do on the outside of my home? Regularly fix the edges of walkways and driveways and fix any cracks. Remove anything that might make you trip as you walk through a door, such as a raised step or threshold. Trim any bushes or trees on the path to your home. Use bright outdoor lighting. Clear any walking paths of anything that might make someone trip, such as rocks or tools. Regularly check to see if handrails are loose or broken. Make sure that both sides of any steps have handrails. Any raised decks and porches should have guardrails on the edges. Have any leaves, snow, or ice cleared regularly. Use sand or salt on walking paths during winter. Clean up any spills in your garage right away. This includes oil or grease spills. What can I do in the bathroom? Use night lights. Install grab  bars by the toilet and in the tub and shower. Do not use towel bars as grab bars. Use non-skid mats or decals in the tub or shower. If you need to sit down in the shower, use a plastic, non-slip stool. Keep the floor dry. Clean up any water that spills on the floor as soon as it happens. Remove soap buildup in the tub or shower regularly. Attach bath mats securely with double-sided non-slip rug tape. Do not have throw rugs and other things on the floor that can make you trip. What can I do in the bedroom? Use night lights. Make sure that you have a light by your bed that is easy to reach. Do not use any sheets or blankets that are too big for your bed. They should not hang down onto the floor. Have a firm chair that has side arms. You can use this for support while you get dressed. Do not have throw rugs and other things on the floor that can make you trip. What can I do in the kitchen? Clean up any spills right away. Avoid walking on wet floors. Keep items  that you use a lot in easy-to-reach places. If you need to reach something above you, use a strong step stool that has a grab bar. Keep electrical cords out of the way. Do not use floor polish or wax that makes floors slippery. If you must use wax, use non-skid floor wax. Do not have throw rugs and other things on the floor that can make you trip. What can I do with my stairs? Do not leave any items on the stairs. Make sure that there are handrails on both sides of the stairs and use them. Fix handrails that are broken or loose. Make sure that handrails are as long as the stairways. Check any carpeting to make sure that it is firmly attached to the stairs. Fix any carpet that is loose or worn. Avoid having throw rugs at the top or bottom of the stairs. If you do have throw rugs, attach them to the floor with carpet tape. Make sure that you have a light switch at the top of the stairs and the bottom of the stairs. If you do not have them,  ask someone to add them for you. What else can I do to help prevent falls? Wear shoes that: Do not have high heels. Have rubber bottoms. Are comfortable and fit you well. Are closed at the toe. Do not wear sandals. If you use a stepladder: Make sure that it is fully opened. Do not climb a closed stepladder. Make sure that both sides of the stepladder are locked into place. Ask someone to hold it for you, if possible. Clearly mark and make sure that you can see: Any grab bars or handrails. First and last steps. Where the edge of each step is. Use tools that help you move around (mobility aids) if they are needed. These include: Canes. Walkers. Scooters. Crutches. Turn on the lights when you go into a dark area. Replace any light bulbs as soon as they burn out. Set up your furniture so you have a clear path. Avoid moving your furniture around. If any of your floors are uneven, fix them. If there are any pets around you, be aware of where they are. Review your medicines with your doctor. Some medicines can make you feel dizzy. This can increase your chance of falling. Ask your doctor what other things that you can do to help prevent falls. This information is not intended to replace advice given to you by your health care provider. Make sure you discuss any questions you have with your health care provider. Document Released: 01/25/2009 Document Revised: 09/06/2015 Document Reviewed: 05/05/2014 Elsevier Interactive Patient Education  2017 [employer].

## 2022-10-02 NOTE — Progress Notes (Signed)
Subjective:   Shawn Ochoa is a 72 y.o. male who presents for Medicare Annual/Subsequent preventive examination.  Visit Complete: Virtual  I connected with  [name] on 10/02/22 by a audio enabled telemedicine application and verified that I am speaking with the correct person using two identifiers.  Patient Location: Home  Provider Location: Home Office  I discussed the limitations of evaluation and management by telemedicine. The patient expressed understanding and agreed to proceed.  Patient Medicare AWV questionnaire was completed by the patient on 10/02/2022; I have confirmed that all information answered by patient is correct and no changes since this date.  Review of Systems     Cardiac Risk Factors include: advanced age (>96men, >52 women);male gender;dyslipidemia;hypertension     Objective:    Today's Vitals   10/02/22 0850  Weight: 192 lb (87.1 kg)  Height: 5\' 10"  (1.778 m)   Body mass index is 27.55 kg/m.     10/02/2022    8:54 AM 09/30/2021    9:18 AM 09/27/2020   10:17 AM 09/27/2019    9:58 AM 09/22/2018    8:37 AM  Advanced Directives  Does Patient Have a Medical Advance Directive? Yes Yes Yes Yes Yes  Type of Estate agent of [place];Living will Healthcare Power of [place];Living will Healthcare Power of [place];Living will Healthcare Power of [place];Living will Living will;Healthcare Power of Attorney  Does patient want to make changes to medical advance directive?    No - Patient declined No - Patient declined  Copy of Healthcare Power of Attorney in Chart? No - copy requested No - copy requested No - copy requested  No - copy requested    Current Medications (verified) Outpatient Encounter Medications as of 10/02/2022  Medication Sig   aspirin 81 MG EC tablet Take 81 mg by mouth daily.     atorvastatin (LIPITOR) 20 MG tablet Take 1 tablet (20 mg total) by mouth daily.   benazepril (LOTENSIN) 20 MG tablet  Take 1 tablet (20 mg total) by mouth daily.   Calcium Carbonate-Vitamin D (CALCIUM + D PO) Take 1 capsule by mouth daily.     Cholecalciferol (VITAMIN D3) 5000 UNITS CAPS Take 1 capsule by mouth daily.     DENTAGEL 1.1 % GEL dental gel PLEASE SEE ATTACHED FOR DETAILED DIRECTIONS   fluticasone (FLONASE) 50 MCG/ACT nasal spray SPRAY 1 SPRAY INTO BOTH NOSTRILS DAILY.   levothyroxine (SYNTHROID) 75 MCG tablet Take 1 tablet (75 mcg total) by mouth daily.   Omega-3 Fatty Acids (FISH OIL) 1000 MG CAPS Take 1 capsule by mouth 2 (two) times daily.     No facility-administered encounter medications on file as of 10/02/2022.    Allergies (verified) Mobic [meloxicam]   History: Past Medical History:  Diagnosis Date   Adenomatous polyp    Cardiac dysrhythmia, unspecified    Cataract    Essential hypertension, benign    Hemorrhage of gastrointestinal tract, unspecified    Metabolic syndrome    Nontoxic multinodular goiter    Other and unspecified hyperlipidemia    Prostatism    Past Surgical History:  Procedure Laterality Date   CATARACT EXTRACTION     KNEE CARTILAGE SURGERY Right    THYROIDECTOMY     Family History  Problem Relation Age of Onset   Diabetes Mother    Hypertension Mother    Heart disease Mother    Stroke Father    Hypertension Father    Colon cancer Maternal Uncle 36  Colon cancer Other 20   Social History   Socioeconomic History   Marital status: Married    Spouse name: Stanton Kidney   Number of children: 3   Years of education: Not on file   Highest education level: Professional school degree (e.g., MD, DDS, DVM, JD)  Occupational History   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  Tobacco Use   Smoking status: Never   Smokeless tobacco: Never  Building services engineer Use: Never used  Substance and Sexual Activity   Alcohol use: Yes    Comment: rare   Drug use: No   Sexual activity: Not Currently  Other Topics Concern   Not on file  Social History  Narrative   Lives home with wife; children live in [place]    Social Determinants of Health   Financial Resource Strain: Low Risk  (10/02/2022)   Overall Financial Resource Strain (CARDIA)    Difficulty of Paying Living Expenses: Not hard at all  Food Insecurity: No Food Insecurity (10/02/2022)   Hunger Vital Sign    Worried About Running Out of Food in the Last Year: Never true    Ran Out of Food in the Last Year: Never true  Transportation Needs: No Transportation Needs (10/02/2022)   PRAPARE - Administrator, Civil Service (Medical): No    Lack of Transportation (Non-Medical): No  Physical Activity: Sufficiently Active (10/02/2022)   Exercise Vital Sign    Days of Exercise per Week: 7 days    Minutes of Exercise per Session: 30 min  Stress: No Stress Concern Present (10/02/2022)   [employer] of Occupational Health - Occupational Stress Questionnaire    Feeling of Stress : Not at all  Social Connections: Socially Integrated (10/02/2022)   Social Connection and Isolation Panel [NHANES]    Frequency of Communication with Friends and Family: More than three times a week    Frequency of Social Gatherings with Friends and Family: More than three times a week    Attends Religious Services: More than 4 times per year    Active Member of [employer] or Organizations: Yes    Attends Engineer, structural: More than 4 times per year    Marital Status: Married    Tobacco Counseling Counseling given: Not Answered   Clinical Intake:  Pre-visit preparation completed: Yes  Pain : No/denies pain     Nutritional Risks: None Diabetes: No  How often do you need to have someone help you when you read instructions, pamphlets, or other written materials from your doctor or pharmacy?: 1 - Never  Interpreter Needed?: No  Information entered by :: Renie Ora, LPN   Activities of Daily Living    10/02/2022    8:54 AM  In your present state of health, do you have  any difficulty performing the following activities:  Hearing? 0  Vision? 0  Difficulty concentrating or making decisions? 0  Walking or climbing stairs? 0  Dressing or bathing? 0  Doing errands, shopping? 0  Preparing Food and eating ? N  Using the Toilet? N  In the past six months, have you accidently leaked urine? N  Do you have problems with loss of bowel control? N  Managing your Medications? N  Managing your Finances? N  Housekeeping or managing your Housekeeping? N    Patient Care Team: Rakes, Doralee Albino, FNP as PCP - General (Family Medicine) [facility] Bjorn Pippin, MD as Attending Physician (Urology) Zannie Kehr  C, MD as Referring Physician (Dermatology)  Indicate any recent Medical Services you may have received from other than [facility] providers in the past year (date may be approximate).     Assessment:   This is a routine wellness examination for Najeh.  Hearing/Vision screen Vision Screening - Comments:: Wears rx glasses - up to date with routine eye exams with  Dr.Groat   Dietary issues and exercise activities discussed:     Goals Addressed             This Visit's Progress    Have 3 meals a day   On track    Eat 3 healthy meals daily that consist of lean proteins, fresh fruits and vegetables.        Depression Screen    10/02/2022    8:53 AM 09/23/2022   11:21 AM 02/13/2022   12:12 PM 09/30/2021    9:09 AM 07/04/2021    9:13 AM 04/02/2021    8:56 AM 10/02/2020    8:51 AM  PHQ 2/9 Scores  PHQ - 2 Score 0 0 0 0 0 0 0  PHQ- 9 Score 0 0 0  0 0 0    Fall Risk    10/02/2022    8:51 AM 09/23/2022   11:21 AM 02/13/2022   12:12 PM 09/30/2021    9:08 AM 07/04/2021    9:13 AM  Fall Risk   Falls in the past year? 0 0 0 0 0  Number falls in past yr: 0   0   Injury with Fall? 0   0   Risk for fall due to : No Fall Risks   No Fall Risks   Follow up Falls prevention discussed   Falls prevention discussed     MEDICARE RISK AT HOME:   Medicare Risk at Home - 10/02/22 0851     Any stairs in or around the home? Yes    If so, are there any without handrails? No    Home free of loose throw rugs in walkways, pet beds, electrical cords, etc? Yes    Adequate lighting in your home to reduce risk of falls? Yes    Life alert? No    Use of a cane, walker or w/c? No    Grab bars in the bathroom? Yes    Shower chair or bench in shower? Yes    Elevated toilet seat or a handicapped toilet? Yes             TIMED UP AND GO:  Was the test performed?  No    Cognitive Function:        10/02/2022    8:55 AM 09/30/2021    9:13 AM 09/27/2019    9:34 AM 09/22/2018    8:39 AM  6CIT Screen  What Year? 0 points 0 points 0 points 0 points  What month? 0 points 0 points 0 points 0 points  What time? 0 points 0 points 0 points 0 points  Count back from 20 0 points 0 points 0 points 0 points  Months in reverse 0 points 0 points 0 points 0 points  Repeat phrase 0 points 0 points  0 points  Total Score 0 points 0 points  0 points    Immunizations Immunization History  Administered Date(s) Administered   Fluad Quad(high Dose 65+) 01/26/2019, 01/20/2020, 03/05/2021, 01/09/2022   Influenza Split 01/15/2016   Influenza Whole 01/12/2009   Influenza, High Dose Seasonal PF 01/06/2017, 01/27/2018   Influenza-Unspecified  01/12/2014, 02/22/2015   Moderna Covid-19 Vaccine Bivalent Booster 54yrs & up 01/23/2021   Moderna Sars-Covid-2 Vaccination 05/10/2019, 06/07/2019, 02/15/2020, 08/15/2020   Pneumococcal Conjugate-13 05/12/2014   Pneumococcal Polysaccharide-23 06/12/1996, 06/18/2016   Td 02/12/2005, 01/27/2018   Tdap 01/10/2010   Zoster Recombinat (Shingrix) 10/02/2020, 04/02/2021   Zoster, Live 07/26/2009    TDAP status: Up to date  Flu Vaccine status: Up to date  Pneumococcal vaccine status: Up to date  Covid-19 vaccine status: Completed vaccines  Qualifies for Shingles Vaccine? Yes   Zostavax completed Yes   Shingrix  Completed?: Yes  Screening Tests Health Maintenance  Topic Date Due   INFLUENZA VACCINE  11/13/2022   Medicare Annual Wellness (AWV)  10/02/2023   Colonoscopy  01/01/2025   DTaP/Tdap/Td (4 - Td or Tdap) 01/28/2028   Pneumonia Vaccine 70+ Years old  Completed   Hepatitis C Screening  Completed   Zoster Vaccines- Shingrix  Completed   HPV VACCINES  Aged Out   COLON CANCER SCREENING ANNUAL FOBT  Discontinued   COVID-19 Vaccine  Discontinued    Health Maintenance  There are no preventive care reminders to display for this patient.   Colorectal cancer screening: Type of screening: Colonoscopy. Completed 01/02/2020. Repeat every 5 years  Lung Cancer Screening: (Low Dose CT Chest recommended if Age 46-80 years, 20 pack-year currently smoking OR have quit w/in 15years.) does not qualify.   Lung Cancer Screening Referral: n/a  Additional Screening:  Hepatitis C Screening: does not qualify; Completed 01/06/2017  Vision Screening: Recommended annual ophthalmology exams for early detection of glaucoma and other disorders of the eye. Is the patient up to date with their annual eye exam?  Yes  Who is the provider or what is the name of the office in which the patient attends annual eye exams? Dr.Groat  If pt is not established with a provider, would they like to be referred to a provider to establish care? No .   Dental Screening: Recommended annual dental exams for proper oral hygiene  Diabetic Foot Exam: Diabetic Foot Exam: Overdue, Pt has been advised about the importance in completing this exam. Pt is scheduled for diabetic foot exam on next office visit .  Community Resource Referral / Chronic Care Management: CRR required this visit?  No   CCM required this visit?  No     Plan:     I have personally reviewed and noted the following in the patient's chart:   Medical and social history Use of alcohol, tobacco or illicit drugs  Current medications and supplements including  opioid prescriptions. Patient is not currently taking opioid prescriptions. Functional ability and status Nutritional status Physical activity Advanced directives List of other physicians Hospitalizations, surgeries, and ER visits in previous 12 months Vitals Screenings to include cognitive, depression, and falls Referrals and appointments  In addition, I have reviewed and discussed with patient certain preventive protocols, quality metrics, and best practice recommendations. A written personalized care plan for preventive services as well as general preventive health recommendations were provided to patient.     Lorrene Reid, LPN   [id number]   After Visit Summary: (MyChart) Due to this being a telephonic visit, the after visit summary with patients personalized plan was offered to patient via MyChart   Nurse Notes: none

## 2022-10-30 ENCOUNTER — Ambulatory Visit: Payer: Medicare HMO | Admitting: Urology

## 2022-10-30 ENCOUNTER — Encounter: Payer: Self-pay | Admitting: Urology

## 2022-10-30 VITALS — BP 128/78 | HR 71 | Ht 70.0 in | Wt 192.0 lb

## 2022-10-30 DIAGNOSIS — Z87440 Personal history of urinary (tract) infections: Secondary | ICD-10-CM | POA: Diagnosis not present

## 2022-10-30 DIAGNOSIS — N138 Other obstructive and reflux uropathy: Secondary | ICD-10-CM

## 2022-10-30 DIAGNOSIS — N401 Enlarged prostate with lower urinary tract symptoms: Secondary | ICD-10-CM

## 2022-10-30 DIAGNOSIS — R3912 Poor urinary stream: Secondary | ICD-10-CM

## 2022-10-30 DIAGNOSIS — R972 Elevated prostate specific antigen [PSA]: Secondary | ICD-10-CM

## 2022-10-30 DIAGNOSIS — R351 Nocturia: Secondary | ICD-10-CM

## 2022-10-30 LAB — URINALYSIS, ROUTINE W REFLEX MICROSCOPIC
Bilirubin, UA: NEGATIVE
Glucose, UA: NEGATIVE
Ketones, UA: NEGATIVE
Nitrite, UA: NEGATIVE
Protein,UA: NEGATIVE
RBC, UA: NEGATIVE
Specific Gravity, UA: 1.02 (ref 1.005–1.030)
Urobilinogen, Ur: 0.2 mg/dL (ref 0.2–1.0)
pH, UA: 6 (ref 5.0–7.5)

## 2022-10-30 LAB — MICROSCOPIC EXAMINATION
Bacteria, UA: NONE SEEN
RBC, Urine: NONE SEEN /hpf (ref 0–2)

## 2022-10-30 NOTE — Progress Notes (Signed)
Shawn Ochoa CONFIRMATION # [id number]

## 2022-10-30 NOTE — Progress Notes (Signed)
Subjective: 1. Elevated PSA   2. Personal history of urinary infection   3. BPH with urinary obstruction   4. Weak urinary stream   5. Nocturia        [name] for his history of an intermittently elevated PSA.  His PSA was 12.59 in 2009 but fell to 2.1 in 2016.  It was back up again to 11.1 in 10/18 then back down to 2.6 in 4/19.  He has had 2 prior negative biopsies with the last in 2009 and a negative PCA3 as well.  More recently, his PSA was 2.3 in 10/20, 4.4 in 10/21, 4.8 in 7/22, 4.4 in 1/23, 3.8 in 7/23 and 5.0 with a 19.6% f/t ratio on 09/23/22.  His prostate was 45ml in 2009.  He had a small left nodule on exam in 1/19 but it was not apparent on repeat exam in 4/19.  He has had prior UTI's.  He has had no concerning symptoms.  His IPSS is 10 with a reduced stream and nocturia.  His UA is unremarkable today.    ROS:  Review of Systems  All other systems reviewed and are negative.   Allergies  Allergen Reactions   Mobic [Meloxicam] Other (See Comments)    Rectal bleeding with only 2 pills     Past Medical History:  Diagnosis Date   Adenomatous polyp    Cardiac dysrhythmia, unspecified    Cataract    Essential hypertension, benign    Hemorrhage of gastrointestinal tract, unspecified    Metabolic syndrome    Nontoxic multinodular goiter    Other and unspecified hyperlipidemia    Prostatism     Past Surgical History:  Procedure Laterality Date   CATARACT EXTRACTION     KNEE CARTILAGE SURGERY Right    THYROIDECTOMY      Social History   Socioeconomic History   Marital status: Married    Spouse name: Stanton Kidney   Number of children: 3   Years of education: Not on file   Highest education level: Professional school degree (e.g., MD, DDS, DVM, JD)  Occupational History   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  Tobacco Use   Smoking status: Never   Smokeless tobacco: Never  Vaping Use   Vaping status: Never Used  Substance and Sexual Activity    Alcohol use: Yes    Comment: rare   Drug use: No   Sexual activity: Not Currently  Other Topics Concern   Not on file  Social History Narrative   Lives home with wife; children live in [place]    Social Determinants of Health   Financial Resource Strain: Low Risk  (10/02/2022)   Overall Financial Resource Strain (CARDIA)    Difficulty of Paying Living Expenses: Not hard at all  Food Insecurity: No Food Insecurity (10/02/2022)   Hunger Vital Sign    Worried About Running Out of Food in the Last Year: Never true    Ran Out of Food in the Last Year: Never true  Transportation Needs: No Transportation Needs (10/02/2022)   PRAPARE - Administrator, Civil Service (Medical): No    Lack of Transportation (Non-Medical): No  Physical Activity: Sufficiently Active (10/02/2022)   Exercise Vital Sign    Days of Exercise per Week: 7 days    Minutes of Exercise per Session: 30 min  Stress: No Stress Concern Present (10/02/2022)   [employer] of Occupational Health - Occupational Stress Questionnaire    Feeling of Stress :  Not at all  Social Connections: Socially Integrated (10/02/2022)   Social Connection and Isolation Panel [NHANES]    Frequency of Communication with Friends and Family: More than three times a week    Frequency of Social Gatherings with Friends and Family: More than three times a week    Attends Religious Services: More than 4 times per year    Active Member of [employer] or Organizations: Yes    Attends Engineer, structural: More than 4 times per year    Marital Status: Married  Catering manager Violence: Not At Risk (10/02/2022)   Humiliation, Afraid, Rape, and Kick questionnaire    Fear of Current or Ex-Partner: No    Emotionally Abused: No    Physically Abused: No    Sexually Abused: No    Family History  Problem Relation Age of Onset   Diabetes Mother    Hypertension Mother    Heart disease Mother    Stroke Father    Hypertension Father     Colon cancer Maternal Uncle 19   Colon cancer Other 20    Anti-infectives: Anti-infectives (From admission, onward)    None       Current Outpatient Medications  Medication Sig Dispense Refill   aspirin 81 MG EC tablet Take 81 mg by mouth daily.       atorvastatin (LIPITOR) 20 MG tablet Take 1 tablet (20 mg total) by mouth daily. 90 tablet 1   benazepril (LOTENSIN) 20 MG tablet Take 1 tablet (20 mg total) by mouth daily. 90 tablet 1   Calcium Carbonate-Vitamin D (CALCIUM + D PO) Take 1 capsule by mouth daily.       Cholecalciferol (VITAMIN D3) 5000 UNITS CAPS Take 1 capsule by mouth daily.       DENTAGEL 1.1 % GEL dental gel PLEASE SEE ATTACHED FOR DETAILED DIRECTIONS     fluticasone (FLONASE) 50 MCG/ACT nasal spray SPRAY 1 SPRAY INTO BOTH NOSTRILS DAILY. 48 mL 1   levothyroxine (SYNTHROID) 75 MCG tablet Take 1 tablet (75 mcg total) by mouth daily. 90 tablet 1   Omega-3 Fatty Acids (FISH OIL) 1000 MG CAPS Take 1 capsule by mouth 2 (two) times daily.       No current facility-administered medications for this visit.     Objective: Vital signs in last 24 hours: BP 128/78   Pulse 71   Ht 5\' 10"  (1.778 m)   Wt 192 lb (87.1 kg)   BMI 27.55 kg/m   Intake/Output from previous day: No intake/output data recorded. Intake/Output this shift: @IOTHISSHIFT @   Physical Exam Vitals reviewed.  Constitutional:      Appearance: Normal appearance.  Genitourinary:    Comments: AP/NST without mass. Prostate is 2.5+ benign. SV's non-palpable.  Neurological:     Mental Status: He is alert.     Lab Results:  No results found for this or any previous visit (from the past 24 hour(s)).  BMET No results for input(s): "NA", "K", "CL", "CO2", "GLUCOSE", "BUN", "CREATININE", "CALCIUM" in the last 72 hours. PT/INR No results for input(s): "LABPROT", "INR" in the last 72 hours. ABG No results for input(s): "PHART", "HCO3" in the last 72 hours.  Invalid input(s): "PCO2", "PO2" Lab  Results  Component Value Date   PSA1 5.0 (H) 09/23/2022   PSA1 3.8 10/31/2021   PSA1 4.4 (H) 05/02/2021  CBC and CMP on 6/11 were ok.   Studies/Results: No results found.   Assessment/Plan: Elevated PSA.  His PSA is below prior highs  but it is trending up.   I will get an ExoDx test and if positive, I will get an MRIP.  If negative, I will have him return in 1mo with a PSA.  BPH with BOO. He has mild/mod LUTS but minimal bother.  d No orders of the defined types were placed in this encounter.    Orders Placed This Encounter  Procedures   Microscopic Examination   Urinalysis, Routine w reflex microscopic   PSA, total and free    Standing Status:   Future    Standing Expiration Date:   10/30/2023     Return in about 6 months (around 05/02/2023) for with PSA.    CC: Gilford Silvius NP.      Bjorn Pippin 10/31/2022

## 2023-01-10 ENCOUNTER — Other Ambulatory Visit: Payer: Self-pay | Admitting: Family Medicine

## 2023-01-10 DIAGNOSIS — E890 Postprocedural hypothyroidism: Secondary | ICD-10-CM

## 2023-01-10 DIAGNOSIS — I10 Essential (primary) hypertension: Secondary | ICD-10-CM

## 2023-03-25 ENCOUNTER — Encounter: Payer: Self-pay | Admitting: Family Medicine

## 2023-03-25 ENCOUNTER — Ambulatory Visit (INDEPENDENT_AMBULATORY_CARE_PROVIDER_SITE_OTHER): Payer: Medicare HMO | Admitting: Family Medicine

## 2023-03-25 VITALS — BP 128/66 | HR 66 | Temp 97.1°F | Ht 70.0 in | Wt 202.6 lb

## 2023-03-25 DIAGNOSIS — E782 Mixed hyperlipidemia: Secondary | ICD-10-CM

## 2023-03-25 DIAGNOSIS — Z125 Encounter for screening for malignant neoplasm of prostate: Secondary | ICD-10-CM

## 2023-03-25 DIAGNOSIS — Z0000 Encounter for general adult medical examination without abnormal findings: Secondary | ICD-10-CM

## 2023-03-25 DIAGNOSIS — E890 Postprocedural hypothyroidism: Secondary | ICD-10-CM | POA: Diagnosis not present

## 2023-03-25 DIAGNOSIS — Z0001 Encounter for general adult medical examination with abnormal findings: Secondary | ICD-10-CM

## 2023-03-25 DIAGNOSIS — I10 Essential (primary) hypertension: Secondary | ICD-10-CM | POA: Diagnosis not present

## 2023-03-25 DIAGNOSIS — E559 Vitamin D deficiency, unspecified: Secondary | ICD-10-CM

## 2023-03-25 MED ORDER — ATORVASTATIN CALCIUM 20 MG PO TABS: 20.0000 mg | ORAL_TABLET | Freq: Every day | ORAL | 3 refills | Status: DC

## 2023-03-25 NOTE — Progress Notes (Signed)
Complete physical exam  Patient: Shawn Ochoa   DOB: [birth year]   72 y.o. Male  MRN: [id number]  Subjective:    Chief Complaint  Patient presents with   Annual Exam    Shawn Ochoa is a 72 y.o. male who presents today for a complete physical exam. He reports consuming a general diet. The patient has a physically strenuous job, but has no regular exercise apart from work.  He generally feels well. He reports sleeping well. He does not have additional problems to discuss today.   Discussed the use of AI scribe software for clinical note transcription with the patient, who gave verbal consent to proceed.  History of Present Illness   The patient, with a history of basal cell carcinoma, recently underwent a dermatological procedure on his ear. He reported significant pain during the procedure, likening it to 'carving initials on a piece of wood with a burning tool.' The healing process has been ongoing, with the patient expressing initial confusion about the appearance of the healing site. He has been following the recommended care regimen, which involved a week of alcohol application followed by leaving the site alone.  The patient also reported nocturia, typically waking up once or twice a night due to his prostate enlargement. Despite this, he has chosen not to take any medication such as Flomax, as he feels he is currently tolerating the situation.  The patient has been experiencing some ankle swelling, particularly after being up for the day. He has tried using sports compression socks, but these only cover the foot and not the rest of the ankle and leg.  The patient has been actively trying to lose weight, having lost 30 pounds but regained about eight. His strategy involves avoiding bread and increasing vegetable intake. He also reported a hernia, which he was previously unaware of, but it does not cause him any discomfort.  The patient is on thyroid medication  (Synthroid) and expressed appreciation for the upcoming thyroid function check, as he is unsure if his current dosage is correct. He does not perceive any symptoms of sluggishness or fatigue, but acknowledged that he might feel better on a different dosage.  The patient has been keeping up with his vaccinations, including the flu shot and COVID vaccines. He reported experiencing flu-like symptoms for 24 hours after receiving the COVID vaccine. He also reported a slight decline in his vision since his cataract surgery, which he attributed to working in front of a computer and wearing reading glasses.      Most recent fall risk assessment:    03/25/2023    9:04 AM  Fall Risk   Falls in the past year? 0     Most recent depression screenings:    03/25/2023    9:04 AM 10/02/2022    8:53 AM  PHQ 2/9 Scores  PHQ - 2 Score 0 0  PHQ- 9 Score 0 0    Vision:Within last year and Dental: No current dental problems and Receives regular dental care  Patient Active Problem List   Diagnosis Date Noted   BPH with urinary obstruction 11/04/2019   Vitamin D deficiency 07/21/2019   Seasonal allergic rhinitis due to pollen 07/21/2019   Family history of colon cancer 11/28/2015   Essential hypertension 12/06/2012   Hyperlipidemia 12/06/2012   Hypothyroidism 12/06/2012   Metabolic syndrome 12/06/2012   Past Medical History:  Diagnosis Date   Adenomatous polyp    Cardiac dysrhythmia, unspecified    Cataract  Essential hypertension, benign    Hemorrhage of gastrointestinal tract, unspecified    Metabolic syndrome    Nontoxic multinodular goiter    Other and unspecified hyperlipidemia    Prostatism    Past Surgical History:  Procedure Laterality Date   CATARACT EXTRACTION     KNEE CARTILAGE SURGERY Right    THYROIDECTOMY     Social History   Tobacco Use   Smoking status: Never   Smokeless tobacco: Never  Vaping Use   Vaping status: Never Used  Substance Use Topics   Alcohol use:  Yes    Comment: rare   Drug use: No   Social History   Socioeconomic History   Marital status: Married    Spouse name: Shawn Ochoa   Number of children: 3   Years of education: Not on file   Highest education level: Professional school degree (e.g., MD, DDS, DVM, JD)  Occupational History   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  Tobacco Use   Smoking status: Never   Smokeless tobacco: Never  Vaping Use   Vaping status: Never Used  Substance and Sexual Activity   Alcohol use: Yes    Comment: rare   Drug use: No   Sexual activity: Not Currently  Other Topics Concern   Not on file  Social History Narrative   Lives home with wife; children live in [place]    Social Determinants of Health   Financial Resource Strain: Low Risk  (10/02/2022)   Overall Financial Resource Strain (CARDIA)    Difficulty of Paying Living Expenses: Not hard at all  Food Insecurity: No Food Insecurity (10/02/2022)   Hunger Vital Sign    Worried About Running Out of Food in the Last Year: Never true    Ran Out of Food in the Last Year: Never true  Transportation Needs: No Transportation Needs (10/02/2022)   PRAPARE - Administrator, Civil Service (Medical): No    Lack of Transportation (Non-Medical): No  Physical Activity: Sufficiently Active (10/02/2022)   Exercise Vital Sign    Days of Exercise per Week: 7 days    Minutes of Exercise per Session: 30 min  Stress: No Stress Concern Present (10/02/2022)   [employer] of Occupational Health - Occupational Stress Questionnaire    Feeling of Stress : Not at all  Social Connections: Socially Integrated (10/02/2022)   Social Connection and Isolation Panel [NHANES]    Frequency of Communication with Friends and Family: More than three times a week    Frequency of Social Gatherings with Friends and Family: More than three times a week    Attends Religious Services: More than 4 times per year    Active Member of [employer] or  Organizations: Yes    Attends Banker Meetings: More than 4 times per year    Marital Status: Married  Catering manager Violence: Not At Risk (10/02/2022)   Humiliation, Afraid, Rape, and Kick questionnaire    Fear of Current or Ex-Partner: No    Emotionally Abused: No    Physically Abused: No    Sexually Abused: No   Family Status  Relation Name Status   Mother  Deceased   Father  Deceased   Mat Uncle  Deceased   MGM  Deceased   MGF  Deceased   PGM  Deceased   Other nephew Alive  No partnership data on file   Family History  Problem Relation Age of Onset   Diabetes Mother  Hypertension Mother    Heart disease Mother    Stroke Father    Hypertension Father    Colon cancer Maternal Uncle 8   Colon cancer Other 20   Allergies  Allergen Reactions   Mobic [Meloxicam] Other (See Comments)    Rectal bleeding with only 2 pills       Patient Care Team: Freddye Cardamone, Doralee Albino, FNP as PCP - General (Family Medicine) [facility] Bjorn Pippin, MD as Attending Physician (Urology) Wynona Canes, MD as Referring Physician (Dermatology)   Outpatient Medications Prior to Visit  Medication Sig   aspirin 81 MG EC tablet Take 81 mg by mouth daily.     benazepril (LOTENSIN) 20 MG tablet TAKE 1 TABLET BY MOUTH EVERY DAY   Calcium Carbonate-Vitamin D (CALCIUM + D PO) Take 1 capsule by mouth daily.     Cholecalciferol (VITAMIN D3) 5000 UNITS CAPS Take 1 capsule by mouth daily.     DENTAGEL 1.1 % GEL dental gel PLEASE SEE ATTACHED FOR DETAILED DIRECTIONS   fluticasone (FLONASE) 50 MCG/ACT nasal spray SPRAY 1 SPRAY INTO BOTH NOSTRILS DAILY.   levothyroxine (SYNTHROID) 75 MCG tablet TAKE 1 TABLET BY MOUTH EVERY DAY   Omega-3 Fatty Acids (FISH OIL) 1000 MG CAPS Take 1 capsule by mouth 2 (two) times daily.     [DISCONTINUED] atorvastatin (LIPITOR) 20 MG tablet Take 1 tablet (20 mg total) by mouth daily.   No facility-administered medications prior to visit.     Review of Systems  Constitutional:  Positive for malaise/fatigue.  Cardiovascular:  Positive for leg swelling (minimal and in the evenings).  Genitourinary:        Nocturia  All other systems reviewed and are negative.       Objective:     BP 128/66   Pulse 66   Temp (!) 97.1 F (36.2 C) (Temporal)   Ht 5\' 10"  (1.778 m)   Wt 202 lb 9.6 oz (91.9 kg)   SpO2 97%   BMI 29.07 kg/m  BP Readings from Last 3 Encounters:  03/25/23 128/66  10/30/22 128/78  09/23/22 139/74   Wt Readings from Last 3 Encounters:  03/25/23 202 lb 9.6 oz (91.9 kg)  10/30/22 192 lb (87.1 kg)  10/02/22 192 lb (87.1 kg)   SpO2 Readings from Last 3 Encounters:  03/25/23 97%  09/23/22 99%  02/13/22 99%      Physical Exam Vitals and nursing note reviewed.  Constitutional:      General: He is not in acute distress.    Appearance: Normal appearance. He is well-developed and well-groomed. He is not ill-appearing, toxic-appearing or diaphoretic.  HENT:     Head: Normocephalic and atraumatic.     Jaw: There is normal jaw occlusion.     Right Ear: Hearing, tympanic membrane, ear canal and external ear normal.     Left Ear: Hearing, tympanic membrane, ear canal and external ear normal.     Nose: Nose normal.     Mouth/Throat:     Lips: Pink.     Mouth: Mucous membranes are moist.     Pharynx: Oropharynx is clear. Uvula midline.  Eyes:     General: Lids are normal.     Extraocular Movements: Extraocular movements intact.     Conjunctiva/sclera: Conjunctivae normal.     Pupils: Pupils are equal, round, and reactive to light.  Neck:     Thyroid: No thyroid mass, thyromegaly or thyroid tenderness.     Vascular: No carotid bruit  or JVD.     Trachea: Trachea and phonation normal.  Cardiovascular:     Rate and Rhythm: Normal rate and regular rhythm.     Chest Wall: PMI is not displaced.     Pulses: Normal pulses.     Heart sounds: Normal heart sounds. No murmur heard.    No friction rub. No  gallop.  Pulmonary:     Effort: Pulmonary effort is normal. No respiratory distress.     Breath sounds: Normal breath sounds. No wheezing.  Abdominal:     General: Abdomen is protuberant. Bowel sounds are normal. There is no distension or abdominal bruit.     Palpations: Abdomen is soft. There is no hepatomegaly or splenomegaly.     Tenderness: There is no abdominal tenderness. There is no right CVA tenderness or left CVA tenderness.     Hernia: A hernia is present. Hernia is present in the umbilical area (soft, nontender).  Musculoskeletal:        General: Normal range of motion.     Cervical back: Normal range of motion and neck supple.     Right lower leg: No edema.     Left lower leg: No edema.  Lymphadenopathy:     Cervical: No cervical adenopathy.  Skin:    General: Skin is warm and dry.     Capillary Refill: Capillary refill takes less than 2 seconds.     Coloration: Skin is not cyanotic, jaundiced or pale.     Findings: No rash.  Neurological:     General: No focal deficit present.     Mental Status: He is alert and oriented to person, place, and time.     Sensory: Sensation is intact.     Motor: Motor function is intact.     Coordination: Coordination is intact.     Gait: Gait is intact.     Deep Tendon Reflexes: Reflexes are normal and symmetric.  Psychiatric:        Attention and Perception: Attention and perception normal.        Mood and Affect: Mood and affect normal.        Speech: Speech normal.        Behavior: Behavior normal. Behavior is cooperative.        Thought Content: Thought content normal.        Cognition and Memory: Cognition and memory normal.        Judgment: Judgment normal.       Last CBC Lab Results  Component Value Date   WBC 4.8 09/23/2022   HGB 13.1 09/23/2022   HCT 40.1 09/23/2022   MCV 95 09/23/2022   MCH 31.0 09/23/2022   RDW 11.9 09/23/2022   PLT 281 09/23/2022   Last metabolic panel Lab Results  Component Value Date    GLUCOSE 95 09/23/2022   NA 142 09/23/2022   K 4.3 09/23/2022   CL 105 09/23/2022   CO2 26 09/23/2022   BUN 22 09/23/2022   CREATININE 1.14 09/23/2022   EGFR 68 09/23/2022   CALCIUM 9.7 09/23/2022   PROT 6.4 09/23/2022   ALBUMIN 4.0 09/23/2022   LABGLOB 2.4 09/23/2022   AGRATIO 1.7 09/23/2022   BILITOT 1.0 09/23/2022   ALKPHOS 63 09/23/2022   AST 26 09/23/2022   ALT 27 09/23/2022   Last lipids Lab Results  Component Value Date   CHOL 140 09/23/2022   HDL 58 09/23/2022   LDLCALC 70 09/23/2022   TRIG 57 09/23/2022   CHOLHDL 2.4  09/23/2022   Last hemoglobin A1c Lab Results  Component Value Date   HGBA1C 5.8 (H) 07/04/2021   Last thyroid functions Lab Results  Component Value Date   TSH 0.893 09/23/2022   T4TOTAL 8.4 09/23/2022   Last vitamin D Lab Results  Component Value Date   VD25OH 77.9 02/13/2022    Assessment & Plan:    Routine Health Maintenance and Physical Exam  Immunization History  Administered Date(s) Administered   Fluad Quad(high Dose 65+) 01/26/2019, 01/20/2020, 03/05/2021, 01/09/2022   Influenza Split 01/15/2016   Influenza Whole 01/12/2009   Influenza, High Dose Seasonal PF 01/06/2017, 01/27/2018   Influenza-Unspecified 01/12/2014, 02/22/2015, 03/20/2023   Moderna Covid-19 Vaccine Bivalent Booster 68yrs & up 01/23/2021   Moderna Sars-Covid-2 Vaccination 05/10/2019, 06/07/2019, 02/15/2020, 08/15/2020   Pneumococcal Conjugate-13 05/12/2014   Pneumococcal Polysaccharide-23 06/12/1996, 06/18/2016   Td 02/12/2005, 01/27/2018   Tdap 01/10/2010   Zoster Recombinant(Shingrix) 10/02/2020, 04/02/2021   Zoster, Live 07/26/2009    Health Maintenance  Topic Date Due   Medicare Annual Wellness (AWV)  10/02/2023   Colonoscopy  01/01/2025   DTaP/Tdap/Td (4 - Td or Tdap) 01/28/2028   Pneumonia Vaccine 31+ Years old  Completed   INFLUENZA VACCINE  Completed   Hepatitis C Screening  Completed   Zoster Vaccines- Shingrix  Completed   HPV VACCINES   Aged Out   COLON CANCER SCREENING ANNUAL FOBT  Discontinued   COVID-19 Vaccine  Discontinued    Discussed health benefits of physical activity, and encouraged him to engage in regular exercise appropriate for his age and condition.  Problem List Items Addressed This Visit       Cardiovascular and Mediastinum   Essential hypertension   Relevant Medications   atorvastatin (LIPITOR) 20 MG tablet   Other Relevant Orders   CBC with Differential/Platelet   CMP14+EGFR   Lipid panel   Thyroid Panel With TSH     Endocrine   Hypothyroidism   Relevant Orders   Thyroid Panel With TSH     Other   Hyperlipidemia   Relevant Medications   atorvastatin (LIPITOR) 20 MG tablet   Other Relevant Orders   CMP14+EGFR   Lipid panel   Vitamin D deficiency   Relevant Orders   CMP14+EGFR   VITAMIN D 25 Hydroxy (Vit-D Deficiency, Fractures)   Other Visit Diagnoses     Annual physical exam    -  Primary   Relevant Orders   CBC with Differential/Platelet   CMP14+EGFR   Lipid panel   Thyroid Panel With TSH   PSA, total and free   Screening for prostate cancer       Relevant Orders   PSA, total and free     Assessment and Plan    Basal Cell Carcinoma (Post-Excision) Basal cell carcinoma excised from the ear. Significant pain reported during the procedure. Following post-operative care instructions: applying alcohol for a week, then leaving it alone. Healing progressing well with new skin tissue forming. - Continue current wound care regimen - Follow up with dermatologist in 4 weeks  Benign Prostatic Hyperplasia (BPH) Reports nocturia, typically getting up once or twice per night to urinate. Not on medication for BPH and tolerating symptoms well. Discussed potential use of Flomax if symptoms worsen. - Monitor symptoms - Consider medication if symptoms worsen  Hypothyroidism On Synthroid. No recent dosage changes. No significant fatigue or other symptoms reported. Discussed checking  thyroid function tests including T4 to ensure proper dosing. - Check thyroid function tests including T4  Hyperlipidemia  On atorvastatin. No issues reported with current regimen. - Continue atorvastatin - Check cholesterol levels  Hernia Hernia noticeable when lifting head while lying down. Not causing pain or bowel issues. Discussed that surgery is not needed unless symptoms worsen. - Monitor for any changes in symptoms - No surgical intervention needed at this time  Dependent Edema Occasional ankle swelling, particularly after being up for the day. Uses sports compression socks which help. Discussed the need for knee-high compression socks for better efficacy. - Recommend using knee-high compression socks  General Health Maintenance Up to date on vaccinations including tetanus, pneumonia, flu, COVID-19, and shingles. Last colonoscopy in 2022, next due in 2026. Recent dental cleaning with no issues detected. No changes in vision post-cataract surgery. Takes vitamin D and calcium supplements. - Perform lab work including PSA, cholesterol, Ochoa and liver function, blood counts, and thyroid function - Forward PSA results to Dr. Chauncy Lean  Follow-up - Follow up with primary care physician after lab results are available.       Return in about 1 year (around 03/24/2024) for CPE.     Kari Baars, FNP

## 2023-03-26 LAB — CBC WITH DIFFERENTIAL/PLATELET
Basophils Absolute: 0 10*3/uL (ref 0.0–0.2)
Basos: 1 %
EOS (ABSOLUTE): 0.3 10*3/uL (ref 0.0–0.4)
Eos: 6 %
Hematocrit: 40.4 % (ref 37.5–51.0)
Hemoglobin: 13.3 g/dL (ref 13.0–17.7)
Immature Grans (Abs): 0 10*3/uL (ref 0.0–0.1)
Immature Granulocytes: 1 %
Lymphocytes Absolute: 1.3 10*3/uL (ref 0.7–3.1)
Lymphs: 30 %
MCH: 31 pg (ref 26.6–33.0)
MCHC: 32.9 g/dL (ref 31.5–35.7)
MCV: 94 fL (ref 79–97)
Monocytes Absolute: 0.7 10*3/uL (ref 0.1–0.9)
Monocytes: 15 %
Neutrophils Absolute: 2.1 10*3/uL (ref 1.4–7.0)
Neutrophils: 47 %
Platelets: 287 10*3/uL (ref 150–450)
RBC: 4.29 x10E6/uL (ref 4.14–5.80)
RDW: 11.8 % (ref 11.6–15.4)
WBC: 4.4 10*3/uL (ref 3.4–10.8)

## 2023-03-26 LAB — LIPID PANEL
Chol/HDL Ratio: 2.5 {ratio} (ref 0.0–5.0)
Cholesterol, Total: 136 mg/dL (ref 100–199)
HDL: 55 mg/dL (ref >39)
LDL Chol Calc (NIH): 70 mg/dL (ref 0–99)
Triglycerides: 46 mg/dL (ref 0–149)
VLDL Cholesterol Cal: 11 mg/dL (ref 5–40)

## 2023-03-26 LAB — THYROID PANEL WITH TSH
Free Thyroxine Index: 2.9 (ref 1.2–4.9)
T3 Uptake Ratio: 34 % (ref 24–39)
T4, Total: 8.4 ug/dL (ref 4.5–12.0)
TSH: 1.3 u[IU]/mL (ref 0.450–4.500)

## 2023-03-26 LAB — CMP14+EGFR
ALT: 28 [IU]/L (ref 0–44)
AST: 28 [IU]/L (ref 0–40)
Albumin: 3.7 g/dL — ABNORMAL LOW (ref 3.8–4.8)
Alkaline Phosphatase: 71 [IU]/L (ref 44–121)
BUN/Creatinine Ratio: 18 (ref 10–24)
BUN: 21 mg/dL (ref 8–27)
Bilirubin Total: 0.3 mg/dL (ref 0.0–1.2)
CO2: 24 mmol/L (ref 20–29)
Calcium: 9.3 mg/dL (ref 8.6–10.2)
Chloride: 108 mmol/L — ABNORMAL HIGH (ref 96–106)
Creatinine, Ser: 1.18 mg/dL (ref 0.76–1.27)
Globulin, Total: 2.3 g/dL (ref 1.5–4.5)
Glucose: 101 mg/dL — ABNORMAL HIGH (ref 70–99)
Potassium: 4.7 mmol/L (ref 3.5–5.2)
Sodium: 144 mmol/L (ref 134–144)
Total Protein: 6 g/dL (ref 6.0–8.5)
eGFR: 65 mL/min/{1.73_m2} (ref >59)

## 2023-03-26 LAB — PSA, TOTAL AND FREE
PSA, Free Pct: 21 %
PSA, Free: 1.01 ng/mL
Prostate Specific Ag, Serum: 4.8 ng/mL — ABNORMAL HIGH (ref 0.0–4.0)

## 2023-03-26 LAB — VITAMIN D 25 HYDROXY (VIT D DEFICIENCY, FRACTURES): Vit D, 25-Hydroxy: 66.5 ng/mL (ref 30.0–100.0)

## 2023-04-13 ENCOUNTER — Encounter: Payer: Self-pay | Admitting: Family Medicine

## 2023-04-23 ENCOUNTER — Other Ambulatory Visit: Payer: Medicare HMO

## 2023-04-23 DIAGNOSIS — R972 Elevated prostate specific antigen [PSA]: Secondary | ICD-10-CM

## 2023-04-24 LAB — PSA, TOTAL AND FREE
PSA, Free Pct: 18.5 %
PSA, Free: 1.26 ng/mL
Prostate Specific Ag, Serum: 6.8 ng/mL — ABNORMAL HIGH (ref 0.0–4.0)

## 2023-04-30 ENCOUNTER — Encounter: Payer: Self-pay | Admitting: Urology

## 2023-04-30 ENCOUNTER — Ambulatory Visit: Payer: Medicare HMO | Admitting: Urology

## 2023-04-30 VITALS — BP 148/76 | HR 71

## 2023-04-30 DIAGNOSIS — R351 Nocturia: Secondary | ICD-10-CM | POA: Diagnosis not present

## 2023-04-30 DIAGNOSIS — R3912 Poor urinary stream: Secondary | ICD-10-CM

## 2023-04-30 DIAGNOSIS — N138 Other obstructive and reflux uropathy: Secondary | ICD-10-CM

## 2023-04-30 DIAGNOSIS — N401 Enlarged prostate with lower urinary tract symptoms: Secondary | ICD-10-CM

## 2023-04-30 DIAGNOSIS — R972 Elevated prostate specific antigen [PSA]: Secondary | ICD-10-CM | POA: Diagnosis not present

## 2023-04-30 DIAGNOSIS — Z87440 Personal history of urinary (tract) infections: Secondary | ICD-10-CM

## 2023-04-30 LAB — URINALYSIS, ROUTINE W REFLEX MICROSCOPIC
Bilirubin, UA: NEGATIVE
Glucose, UA: NEGATIVE
Ketones, UA: NEGATIVE
Leukocytes,UA: NEGATIVE
Nitrite, UA: NEGATIVE
Protein,UA: NEGATIVE
RBC, UA: NEGATIVE
Specific Gravity, UA: 1.025 (ref 1.005–1.030)
Urobilinogen, Ur: 0.2 mg/dL (ref 0.2–1.0)
pH, UA: 6 (ref 5.0–7.5)

## 2023-04-30 NOTE — Progress Notes (Signed)
Subjective: 1. Elevated PSA   2. BPH with urinary obstruction   3. Weak urinary stream   4. Nocturia   5. Personal history of urinary infection     04/30/23: Shawn Ochoa returns today in f/u for his history of an elevated PSA.  The PSA prior to this visit on 04/23/23 was up to 6.8 from 4.8 a month prior.  His prior high was 12.59 in 2009. He has had 2 negative biopsies.  He has stable LUTS with an IPSS of 15 and a reduced stream with nocturia x 3.  He has some frequency and urgency.   His UA is clear.  He had a viral illness that began after the December blood draw and prior to the January level.  He has had no hematuria or dysuria.  He had an ExoDx test in 7/24 and the level was very low at 7.9.     10/30/22 Shawn Ochoa returns for his history of an intermittently elevated PSA.  His PSA was 12.59 in 2009 but fell to 2.1 in 2016.  It was back up again to 11.1 in 10/18 then back down to 2.6 in 4/19.  He has had 2 prior negative biopsies with the last in 2009 and a negative PCA3 as well.  More recently, his PSA was 2.3 in 10/20, 4.4 in 10/21, 4.8 in 7/22, 4.4 in 1/23, 3.8 in 7/23 and 5.0 with a 19.6% f/t ratio on 09/23/22.  His prostate was 45ml in 2009.  He had a small left nodule on exam in 1/19 but it was not apparent on repeat exam in 4/19.  He has had prior UTI's.  He has had no concerning symptoms.  His IPSS is 10 with a reduced stream and nocturia.  His UA is unremarkable today.    ROS:  Review of Systems  HENT:  Positive for congestion.   All other systems reviewed and are negative.   Allergies  Allergen Reactions   Mobic [Meloxicam] Other (See Comments)    Rectal bleeding with only 2 pills     Past Medical History:  Diagnosis Date   Adenomatous polyp    Cardiac dysrhythmia, unspecified    Cataract    Essential hypertension, benign    Hemorrhage of gastrointestinal tract, unspecified    Metabolic syndrome    Nontoxic multinodular goiter    Other and unspecified hyperlipidemia     Prostatism     Past Surgical History:  Procedure Laterality Date   CATARACT EXTRACTION     KNEE CARTILAGE SURGERY Right    THYROIDECTOMY      Social History   Socioeconomic History   Marital status: Married    Spouse name: Stanton Kidney   Number of children: 3   Years of education: Not on file   Highest education level: Professional school degree (e.g., MD, DDS, DVM, JD)  Occupational History   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  Tobacco Use   Smoking status: Never   Smokeless tobacco: Never  Vaping Use   Vaping status: Never Used  Substance and Sexual Activity   Alcohol use: Yes    Comment: rare   Drug use: No   Sexual activity: Not Currently  Other Topics Concern   Not on file  Social History Narrative   Lives home with wife; children live in [place]    Social Drivers of Health   Financial Resource Strain: Low Risk  (10/02/2022)   Overall Financial Resource Strain (CARDIA)    Difficulty of  Paying Living Expenses: Not hard at all  Food Insecurity: No Food Insecurity (10/02/2022)   Hunger Vital Sign    Worried About Running Out of Food in the Last Year: Never true    Ran Out of Food in the Last Year: Never true  Transportation Needs: No Transportation Needs (10/02/2022)   PRAPARE - Administrator, Civil Service (Medical): No    Lack of Transportation (Non-Medical): No  Physical Activity: Sufficiently Active (10/02/2022)   Exercise Vital Sign    Days of Exercise per Week: 7 days    Minutes of Exercise per Session: 30 min  Stress: No Stress Concern Present (10/02/2022)   [employer] of Occupational Health - Occupational Stress Questionnaire    Feeling of Stress : Not at all  Social Connections: Socially Integrated (10/02/2022)   Social Connection and Isolation Panel [NHANES]    Frequency of Communication with Friends and Family: More than three times a week    Frequency of Social Gatherings with Friends and Family: More than three times  a week    Attends Religious Services: More than 4 times per year    Active Member of [employer] or Organizations: Yes    Attends Engineer, structural: More than 4 times per year    Marital Status: Married  Catering manager Violence: Not At Risk (10/02/2022)   Humiliation, Afraid, Rape, and Kick questionnaire    Fear of Current or Ex-Partner: No    Emotionally Abused: No    Physically Abused: No    Sexually Abused: No    Family History  Problem Relation Age of Onset   Diabetes Mother    Hypertension Mother    Heart disease Mother    Stroke Father    Hypertension Father    Colon cancer Maternal Uncle 19   Colon cancer Other 20    Anti-infectives: Anti-infectives (From admission, onward)    None       Current Outpatient Medications  Medication Sig Dispense Refill   aspirin 81 MG EC tablet Take 81 mg by mouth daily.       atorvastatin (LIPITOR) 20 MG tablet Take 1 tablet (20 mg total) by mouth daily. 90 tablet 3   benazepril (LOTENSIN) 20 MG tablet TAKE 1 TABLET BY MOUTH EVERY DAY 90 tablet 1   Calcium Carbonate-Vitamin D (CALCIUM + D PO) Take 1 capsule by mouth daily.       Cholecalciferol (VITAMIN D3) 5000 UNITS CAPS Take 1 capsule by mouth daily.       DENTAGEL 1.1 % GEL dental gel PLEASE SEE ATTACHED FOR DETAILED DIRECTIONS     fluticasone (FLONASE) 50 MCG/ACT nasal spray SPRAY 1 SPRAY INTO BOTH NOSTRILS DAILY. 48 mL 1   levothyroxine (SYNTHROID) 75 MCG tablet TAKE 1 TABLET BY MOUTH EVERY DAY 90 tablet 1   Omega-3 Fatty Acids (FISH OIL) 1000 MG CAPS Take 1 capsule by mouth 2 (two) times daily.       No current facility-administered medications for this visit.     Objective: Vital signs in last 24 hours: BP (!) 148/76   Pulse 71   Intake/Output from previous day: No intake/output data recorded. Intake/Output this shift: @IOTHISSHIFT @   Physical Exam Vitals reviewed.  Constitutional:      Appearance: Normal appearance.  Neurological:     Mental Status:  He is alert.     Lab Results:  Results for orders placed or performed in visit on 04/30/23 (from the past 24 hours)  Urinalysis,  Routine w reflex microscopic     Status: None   Collection Time: 04/30/23  1:24 PM  Result Value Ref Range   Specific Gravity, UA 1.025 1.005 - 1.030   pH, UA 6.0 5.0 - 7.5   Color, UA Yellow Yellow   Appearance Ur Clear Clear   Leukocytes,UA Negative Negative   Protein,UA Negative Negative/Trace   Glucose, UA Negative Negative   Ketones, UA Negative Negative   RBC, UA Negative Negative   Bilirubin, UA Negative Negative   Urobilinogen, Ur 0.2 0.2 - 1.0 mg/dL   Nitrite, UA Negative Negative   Microscopic Examination Comment    Narrative   Performed at:  [address] [facility] [address], [place], [place]  [id number] Lab Director: Chinita Pester MT, Phone:  [phone]    BMET No results for input(s): "NA", "K", "CL", "CO2", "GLUCOSE", "BUN", "CREATININE", "CALCIUM" in the last 72 hours. PT/INR No results for input(s): "LABPROT", "INR" in the last 72 hours. ABG No results for input(s): "PHART", "HCO3" in the last 72 hours.  Invalid input(s): "PCO2", "PO2" Lab Results  Component Value Date   PSA1 6.8 (H) 04/23/2023   PSA1 4.8 (H) 03/25/2023   PSA1 5.0 (H) 09/23/2022  CBC and CMP on 6/11 were ok.   Studies/Results: No results found.   Assessment/Plan: Elevated PSA.  His PSA is up with a recent viral illness but otherwise stable. The ExoDx was low risk.   I will have him return in a year with a PSA.  BPH with BOO. He has mild/mod LUTS but minimal bother.  He is not interested in the therapy.   No orders of the defined types were placed in this encounter.    Orders Placed This Encounter  Procedures   Urinalysis, Routine w reflex microscopic   PSA, total and free    Standing Status:   Future    Expected Date:   04/22/2024    Expiration Date:   04/29/2024     Return in about 1 year (around 04/29/2024) for with PSA.    CC: Gilford Silvius NP.      Bjorn Pippin 05/01/2023

## 2023-05-03 ENCOUNTER — Encounter: Payer: Self-pay | Admitting: Family Medicine

## 2023-05-17 ENCOUNTER — Other Ambulatory Visit: Payer: Self-pay | Admitting: Family Medicine

## 2023-05-17 DIAGNOSIS — J301 Allergic rhinitis due to pollen: Secondary | ICD-10-CM

## 2023-10-05 ENCOUNTER — Ambulatory Visit (INDEPENDENT_AMBULATORY_CARE_PROVIDER_SITE_OTHER): Payer: Medicare HMO

## 2023-10-05 VITALS — BP 126/70 | Ht 70.0 in | Wt 202.0 lb

## 2023-10-05 DIAGNOSIS — Z0000 Encounter for general adult medical examination without abnormal findings: Secondary | ICD-10-CM | POA: Diagnosis not present

## 2023-10-05 NOTE — Patient Instructions (Signed)
 [name] , Thank you for taking time out of your busy schedule to complete your Annual Wellness Visit with me. I enjoyed our conversation and look forward to speaking with you again next year. I, as well as your care team,  appreciate your ongoing commitment to your health goals. Please review the following plan we discussed and let me know if I can assist you in the future. Your Game plan/ To Do List    Follow up Visits: Next Medicare AWV with our clinical staff: 10/05/24 at 8:00a.m.   Next Office Visit with your provider: 04/15/24 at 8:50a.m.  Clinician Recommendations:  Aim for 30 minutes of exercise or brisk walking, 6-8 glasses of water, and 5 servings of fruits and vegetables each day. N/a      This is a list of the screening recommended for you and due dates:  Health Maintenance  Topic Date Due   Flu Shot  11/13/2023   Medicare Annual Wellness Visit  10/04/2024   Colon Cancer Screening  01/01/2025   DTaP/Tdap/Td vaccine (4 - Td or Tdap) 01/28/2028   Pneumococcal Vaccine for age over 52  Completed   Hepatitis C Screening  Completed   Zoster (Shingles) Vaccine  Completed   HPV Vaccine  Aged Out   Meningitis B Vaccine  Aged Out   Stool Blood Test  Discontinued   COVID-19 Vaccine  Discontinued    Advanced directives: (Copy Requested) Please bring a copy of your health care power of attorney and living will to the office to be added to your chart at your convenience. You can mail to [facility] [address] [place], [place] [address] or email to [email or web address] Advance Care Planning is important because it:  [x]  Makes sure you receive the medical care that is consistent with your values, goals, and preferences  [x]  It provides guidance to your family and loved ones and reduces their decisional burden about whether or not they are making the right decisions based on your wishes.  Follow the link provided in your after visit summary or read over the  paperwork we have mailed to you to help you started getting your Advance Directives in place. If you need assistance in completing these, please reach out to us  so that we can help you!  See attachments for Preventive Care and Fall Prevention Tips.

## 2023-10-05 NOTE — Progress Notes (Signed)
 Subjective:   Shawn Ochoa is a 73 y.o. who presents for a Medicare Wellness preventive visit.  As a reminder, Annual Wellness Visits don't include a physical exam, and some assessments may be limited, especially if this visit is performed virtually. We may recommend an in-person follow-up visit with your provider if needed.  Visit Complete: Virtual I connected with  Ryan Sherwood Metro on 10/05/23 by a audio enabled telemedicine application and verified that I am speaking with the correct person using two identifiers.  Patient Location: Home  Provider Location: Home Office  I discussed the limitations of evaluation and management by telemedicine. The patient expressed understanding and agreed to proceed.  Vital Signs: Because this visit was a virtual/telehealth visit, some criteria may be missing or patient reported. Any vitals not documented were not able to be obtained and vitals that have been documented are patient reported.  VideoDeclined- This patient declined Librarian, academic. Therefore the visit was completed with audio only.  Persons Participating in Visit: Patient.  AWV Questionnaire: Yes: Patient Medicare AWV questionnaire was completed by the patient on 10/01/23; I have confirmed that all information answered by patient is correct and no changes since this date.  Cardiac Risk Factors include: advanced age (>88men, >66 women);dyslipidemia;hypertension;male gender     Objective:    Today's Vitals   10/05/23 0850  BP: 126/70  Weight: 202 lb (91.6 kg)  Height: 5' 10 (1.778 m)   Body mass index is 28.98 kg/m.     10/05/2023    8:45 AM 10/02/2022    8:54 AM 09/30/2021    9:18 AM 09/27/2020   10:17 AM 09/27/2019    9:58 AM 09/22/2018    8:37 AM  Advanced Directives  Does Patient Have a Medical Advance Directive? Yes Yes Yes Yes Yes Yes  Type of Estate agent of [place];Living will Healthcare Power of  [place];Living will Healthcare Power of [place];Living will Healthcare Power of [place];Living will Healthcare Power of [place];Living will Living will;Healthcare Power of Attorney  Does patient want to make changes to medical advance directive?     No - Patient declined No - Patient declined   Copy of Healthcare Power of Attorney in Chart? No - copy requested No - copy requested No - copy requested No - copy requested  No - copy requested      Data saved with a previous flowsheet row definition    Current Medications (verified) Outpatient Encounter Medications as of 10/05/2023  Medication Sig   aspirin 81 MG EC tablet Take 81 mg by mouth daily.     atorvastatin  (LIPITOR) 20 MG tablet Take 1 tablet (20 mg total) by mouth daily.   benazepril  (LOTENSIN ) 20 MG tablet TAKE 1 TABLET BY MOUTH EVERY DAY   Calcium  Carbonate-Vitamin D  (CALCIUM  + D PO) Take 1 capsule by mouth daily.     Cholecalciferol (VITAMIN D3) 5000 UNITS CAPS Take 1 capsule by mouth daily.     DENTAGEL 1.1 % GEL dental gel PLEASE SEE ATTACHED FOR DETAILED DIRECTIONS   fluticasone  (FLONASE ) 50 MCG/ACT nasal spray INSTILL 1 SPRAY INTO BOTH NOSTRILS DAILY   levothyroxine  (SYNTHROID ) 75 MCG tablet TAKE 1 TABLET BY MOUTH EVERY DAY   Omega-3 Fatty Acids (FISH OIL) 1000 MG CAPS Take 1 capsule by mouth 2 (two) times daily.     No facility-administered encounter medications on file as of 10/05/2023.    Allergies (verified) Mobic  [meloxicam ]   History: Past Medical History:  Diagnosis Date  Adenomatous polyp    Cardiac dysrhythmia, unspecified    Cataract    Essential hypertension, benign    Hemorrhage of gastrointestinal tract, unspecified    Metabolic syndrome    Nontoxic multinodular goiter    Other and unspecified hyperlipidemia    Prostatism    Past Surgical History:  Procedure Laterality Date   CATARACT EXTRACTION     EYE SURGERY  2015 - 2016   Cataract Surgery - Lens replacement   KNEE CARTILAGE SURGERY Right     THYROIDECTOMY     Family History  Problem Relation Age of Onset   Diabetes Mother    Hypertension Mother    Heart disease Mother    Stroke Father    Hypertension Father    Colon cancer Maternal Uncle 19   Cancer Maternal Uncle    Colon cancer Other 20   Diabetes Sister    Diabetes Sister    Social History   Socioeconomic History   Marital status: Married    Spouse name: Adrien   Number of children: 3   Years of education: Not on file   Highest education level: Professional school degree (e.g., MD, DDS, DVM, JD)  Occupational History   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  Tobacco Use   Smoking status: Never   Smokeless tobacco: Never  Vaping Use   Vaping status: Never Used  Substance and Sexual Activity   Alcohol use: Not Currently    Comment: rare   Drug use: No   Sexual activity: Not Currently  Other Topics Concern   Not on file  Social History Narrative   Lives home with wife; children live in [place]    Social Drivers of Health   Financial Resource Strain: Low Risk  (10/05/2023)   Overall Financial Resource Strain (CARDIA)    Difficulty of Paying Living Expenses: Not hard at all  Food Insecurity: No Food Insecurity (10/05/2023)   Hunger Vital Sign    Worried About Running Out of Food in the Last Year: Never true    Ran Out of Food in the Last Year: Never true  Transportation Needs: No Transportation Needs (10/05/2023)   PRAPARE - Administrator, Civil Service (Medical): No    Lack of Transportation (Non-Medical): No  Physical Activity: Insufficiently Active (10/05/2023)   Exercise Vital Sign    Days of Exercise per Week: 7 days    Minutes of Exercise per Session: 20 min  Stress: No Stress Concern Present (10/05/2023)   [employer] of Occupational Health - Occupational Stress Questionnaire    Feeling of Stress: Not at all  Social Connections: Socially Integrated (10/05/2023)   Social Connection and Isolation Panel     Frequency of Communication with Friends and Family: Twice a week    Frequency of Social Gatherings with Friends and Family: Twice a week    Attends Religious Services: More than 4 times per year    Active Member of [employer] or Organizations: Yes    Attends Engineer, structural: More than 4 times per year    Marital Status: Married    Tobacco Counseling Counseling given: Yes    Clinical Intake:  Pre-visit preparation completed: Yes  Pain : No/denies pain     BMI - recorded: 28.98 Nutritional Status: BMI 25 -29 Overweight Nutritional Risks: None Diabetes: No  Lab Results  Component Value Date   HGBA1C 5.8 (H) 07/04/2021   HGBA1C 5.3 04/02/2021   HGBA1C 5.4 01/20/2020  How often do you need to have someone help you when you read instructions, pamphlets, or other written materials from your doctor or pharmacy?: 1 - Never  Interpreter Needed?: No  Information entered by :: Alia t/cma   Activities of Daily Living     10/01/2023    9:52 AM  In your present state of health, do you have any difficulty performing the following activities:  Hearing? 0  Vision? 0  Difficulty concentrating or making decisions? 0  Walking or climbing stairs? 0  Dressing or bathing? 0  Doing errands, shopping? 0  Preparing Food and eating ? N  Using the Toilet? N  In the past six months, have you accidently leaked urine? N  Do you have problems with loss of bowel control? N  Managing your Medications? N  Managing your Finances? N  Housekeeping or managing your Housekeeping? N    Patient Care Team: Rakes, Rock HERO, FNP as PCP - General (Family Medicine) [facility] Watt Rush, MD as Attending Physician (Urology) Elnor Rome BROCKS, MD as Referring Physician (Dermatology)  I have updated your Care Teams any recent Medical Services you may have received from other providers in the past year.     Assessment:   This is a routine wellness examination for  Kellar.  Hearing/Vision screen Hearing Screening - Comments:: Pt denies hearing dif Vision Screening - Comments:: Pt wear readers/denies vision dif/pt goes Dr. Octavia in [place] Upcoming appt 7/26   Goals Addressed   None    Depression Screen     10/05/2023    8:57 AM 03/25/2023    9:04 AM 10/02/2022    8:53 AM 09/23/2022   11:21 AM 02/13/2022   12:12 PM 09/30/2021    9:09 AM 07/04/2021    9:13 AM  PHQ 2/9 Scores  PHQ - 2 Score 0 0 0 0 0 0 0  PHQ- 9 Score 0 0 0 0 0  0    Fall Risk     10/05/2023    8:40 AM 10/01/2023    9:52 AM 03/25/2023    9:04 AM 10/02/2022    8:51 AM 09/23/2022   11:21 AM  Fall Risk   Falls in the past year? 1 1 0 0 0  Number falls in past yr: 0 0  0   Injury with Fall? 0 0  0   Risk for fall due to : Impaired balance/gait;Impaired mobility   No Fall Risks   Follow up    Falls prevention discussed     MEDICARE RISK AT HOME:  Medicare Risk at Home Any stairs in or around the home?: (Patient-Rptd) Yes If so, are there any without handrails?: (Patient-Rptd) No Home free of loose throw rugs in walkways, pet beds, electrical cords, etc?: (Patient-Rptd) Yes Adequate lighting in your home to reduce risk of falls?: (Patient-Rptd) Yes Life alert?: (Patient-Rptd) No Use of a cane, walker or w/c?: (Patient-Rptd) No Grab bars in the bathroom?: (Patient-Rptd) No Shower chair or bench in shower?: (Patient-Rptd) Yes Elevated toilet seat or a handicapped toilet?: (Patient-Rptd) No  TIMED UP AND GO:  Was the test performed?  no  Cognitive Function: 6CIT completed        10/05/2023    8:49 AM 10/02/2022    8:55 AM 09/30/2021    9:13 AM 09/27/2019    9:34 AM 09/22/2018    8:39 AM  6CIT Screen  What Year? 0 points 0 points 0 points 0 points 0 points  What  month? 0 points 0 points 0 points 0 points 0 points  What time? 0 points 0 points 0 points 0 points 0 points  Count back from 20 0 points 0 points 0 points 0 points 0 points  Months in reverse 0 points  0 points 0 points 0 points 0 points  Repeat phrase 0 points 0 points 0 points  0 points  Total Score 0 points 0 points 0 points  0 points    Immunizations Immunization History  Administered Date(s) Administered   Fluad Quad(high Dose 65+) 01/26/2019, 01/20/2020, 03/05/2021, 01/09/2022   Influenza Split 01/15/2016   Influenza Whole 01/12/2009   Influenza, High Dose Seasonal PF 01/06/2017, 01/27/2018   Influenza-Unspecified 01/12/2014, 02/22/2015, 03/20/2023   Moderna Covid-19 Vaccine Bivalent Booster 9yrs & up 01/23/2021   Moderna Sars-Covid-2 Vaccination 05/10/2019, 06/07/2019, 02/15/2020, 08/15/2020   Pneumococcal Conjugate-13 05/12/2014   Pneumococcal Polysaccharide-23 06/12/1996, 06/18/2016   Td 02/12/2005, 01/27/2018   Tdap 01/10/2010   Zoster Recombinant(Shingrix) 10/02/2020, 04/02/2021   Zoster, Live 07/26/2009    Screening Tests Health Maintenance  Topic Date Due   INFLUENZA VACCINE  11/13/2023   Medicare Annual Wellness (AWV)  10/04/2024   Colonoscopy  01/01/2025   DTaP/Tdap/Td (4 - Td or Tdap) 01/28/2028   Pneumococcal Vaccine: 50+ Years  Completed   Hepatitis C Screening  Completed   Zoster Vaccines- Shingrix  Completed   HPV VACCINES  Aged Out   Meningococcal B Vaccine  Aged Out   COLON CANCER SCREENING ANNUAL FOBT  Discontinued   COVID-19 Vaccine  Discontinued    Health Maintenance  There are no preventive care reminders to display for this patient.  Health Maintenance Items Addressed: See Nurse Notes at the end of this note  Additional Screening:  Vision Screening: Recommended annual ophthalmology exams for early detection of glaucoma and other disorders of the eye. Would you like a referral to an eye doctor? No    Dental Screening: Recommended annual dental exams for proper oral hygiene  Community Resource Referral / Chronic Care Management: CRR required this visit?  No   CCM required this visit?  No   Plan:    I have personally reviewed  and noted the following in the patient's chart:   Medical and social history Use of alcohol, tobacco or illicit drugs  Current medications and supplements including opioid prescriptions. Patient is not currently taking opioid prescriptions. Functional ability and status Nutritional status Physical activity Advanced directives List of other physicians Hospitalizations, surgeries, and ER visits in previous 12 months Vitals Screenings to include cognitive, depression, and falls Referrals and appointments  In addition, I have reviewed and discussed with patient certain preventive protocols, quality metrics, and best practice recommendations. A written personalized care plan for preventive services as well as general preventive health recommendations were provided to patient.   Ozie Ned, CMA   10/05/2023   After Visit Summary: (MyChart) Due to this being a telephonic visit, the after visit summary with patients personalized plan was offered to patient via MyChart   Notes: Nothing significant to report at this time.

## 2023-10-07 ENCOUNTER — Other Ambulatory Visit: Payer: Self-pay | Admitting: Family Medicine

## 2023-10-07 DIAGNOSIS — E890 Postprocedural hypothyroidism: Secondary | ICD-10-CM

## 2023-10-07 DIAGNOSIS — I10 Essential (primary) hypertension: Secondary | ICD-10-CM

## 2024-04-02 ENCOUNTER — Other Ambulatory Visit: Payer: Self-pay | Admitting: Family Medicine

## 2024-04-02 DIAGNOSIS — E890 Postprocedural hypothyroidism: Secondary | ICD-10-CM

## 2024-04-04 ENCOUNTER — Other Ambulatory Visit: Payer: Self-pay | Admitting: Family Medicine

## 2024-04-04 DIAGNOSIS — I10 Essential (primary) hypertension: Secondary | ICD-10-CM

## 2024-04-09 ENCOUNTER — Other Ambulatory Visit: Payer: Self-pay | Admitting: Family Medicine

## 2024-04-09 DIAGNOSIS — E782 Mixed hyperlipidemia: Secondary | ICD-10-CM

## 2024-04-15 ENCOUNTER — Ambulatory Visit: Payer: Self-pay | Admitting: Family Medicine

## 2024-04-15 ENCOUNTER — Ambulatory Visit (INDEPENDENT_AMBULATORY_CARE_PROVIDER_SITE_OTHER): Payer: Medicare HMO | Admitting: Family Medicine

## 2024-04-15 ENCOUNTER — Encounter: Payer: Self-pay | Admitting: Family Medicine

## 2024-04-15 VITALS — BP 126/74 | HR 62 | Temp 96.6°F | Ht 70.0 in | Wt 212.8 lb

## 2024-04-15 DIAGNOSIS — N401 Enlarged prostate with lower urinary tract symptoms: Secondary | ICD-10-CM

## 2024-04-15 DIAGNOSIS — E559 Vitamin D deficiency, unspecified: Secondary | ICD-10-CM

## 2024-04-15 DIAGNOSIS — E782 Mixed hyperlipidemia: Secondary | ICD-10-CM

## 2024-04-15 DIAGNOSIS — R3912 Poor urinary stream: Secondary | ICD-10-CM

## 2024-04-15 DIAGNOSIS — Z6830 Body mass index (BMI) 30.0-30.9, adult: Secondary | ICD-10-CM

## 2024-04-15 DIAGNOSIS — I10 Essential (primary) hypertension: Secondary | ICD-10-CM | POA: Diagnosis not present

## 2024-04-15 DIAGNOSIS — Z1211 Encounter for screening for malignant neoplasm of colon: Secondary | ICD-10-CM | POA: Diagnosis not present

## 2024-04-15 DIAGNOSIS — Z0000 Encounter for general adult medical examination without abnormal findings: Secondary | ICD-10-CM

## 2024-04-15 DIAGNOSIS — Z1212 Encounter for screening for malignant neoplasm of rectum: Secondary | ICD-10-CM

## 2024-04-15 DIAGNOSIS — E890 Postprocedural hypothyroidism: Secondary | ICD-10-CM | POA: Diagnosis not present

## 2024-04-15 LAB — BAYER DCA HB A1C WAIVED: HB A1C (BAYER DCA - WAIVED): 5.6 % (ref 4.8–5.6)

## 2024-04-15 NOTE — Progress Notes (Signed)
 "  Complete physical exam  Patient: Shawn Ochoa   DOB: [birth year]   74 y.o. Male  MRN: [id number]  Subjective:    Chief Complaint  Patient presents with   Annual Exam    Shawn Ochoa is a 74 y.o. male who presents today for a complete physical exam. He reports consuming a general diet. He walks daily. He generally feels well. He reports sleeping well. He does have additional problems to discuss today.   He has no new issues since his last visit. He experiences nocturia, waking up two to three times a night to urinate, but is not on medications like Flomax. No urinary tract issues.  He continues to take levothyroxine  for a thyroid  condition diagnosed approximately 20 years ago. No increased fatigue, hair, skin, or nail changes. He recalls a warning about his prescription quality but has not experienced significant issues.  He uses Flonase  as needed for sinus issues, typically resolving them with a morning shower. He is on blood pressure medication and reports no issues, although he does not check his blood pressure at home regularly.  He takes vitamin D  daily and is due for a colonoscopy this year, scheduled for August. No abnormal bleeding or changes in bowel movements.  He is on cholesterol medication and aspirin, with no associated muscle aches or pains. He mentions a past period of arthritic pain, which he managed by reducing sugar intake.  He has experienced weight gain over the past two years, regaining 20 of the 30 pounds he previously lost. He attributes his weight gain to dietary choices during the holiday season.  No changes in hearing or vision. He regularly visits the dentist and eye doctor. No unexplained weight loss, night sweats, or significant memory changes, although he notes a general slowing in mental processing speed.  He experiences occasional joint laxity in his legs, particularly when walking, but has not fallen. He typically walks 3,500 to 4,000  steps daily, down from his previous goal of 7,000 steps.      Most recent fall risk assessment:    04/15/2024    8:36 AM  Fall Risk   Falls in the past year? 0  Risk for fall due to : No Fall Risks  Follow up Falls evaluation completed     Most recent depression screenings:    04/15/2024    8:36 AM 10/05/2023    8:57 AM  PHQ 2/9 Scores  PHQ - 2 Score 0 0  PHQ- 9 Score 0 0      Data saved with a previous flowsheet row definition    Vision:Within last year and Dental: No current dental problems and Receives regular dental care  Patient Active Problem List   Diagnosis Date Noted   BPH with urinary obstruction 11/04/2019   Vitamin D  deficiency 07/21/2019   Seasonal allergic rhinitis due to pollen 07/21/2019   Family history of colon cancer 11/28/2015   Essential hypertension 12/06/2012   Hyperlipidemia 12/06/2012   Hypothyroidism 12/06/2012   Metabolic syndrome 12/06/2012   Past Medical History:  Diagnosis Date   Adenomatous polyp    Cardiac dysrhythmia, unspecified    Cataract    Essential hypertension, benign    Hemorrhage of gastrointestinal tract, unspecified    Metabolic syndrome    Nontoxic multinodular goiter    Other and unspecified hyperlipidemia    Prostatism    Past Surgical History:  Procedure Laterality Date   CATARACT EXTRACTION     EYE SURGERY  2015 -  2016   Cataract Surgery - Lens replacement   KNEE CARTILAGE SURGERY Right    THYROIDECTOMY     Social History[1] Social History   Socioeconomic History   Marital status: Married    Spouse name: Adrien   Number of children: 3   Years of education: Not on file   Highest education level: Professional school degree (e.g., MD, DDS, DVM, JD)  Occupational History   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  Tobacco Use   Smoking status: Never   Smokeless tobacco: Never  Vaping Use   Vaping status: Never Used  Substance and Sexual Activity   Alcohol use: Not Currently    Comment: rare    Drug use: No   Sexual activity: Not Currently  Other Topics Concern   Not on file  Social History Narrative   Lives home with wife; children live in [place]    Social Drivers of Health   Tobacco Use: Low Risk (04/15/2024)   Patient History    Smoking Tobacco Use: Never    Smokeless Tobacco Use: Never    Passive Exposure: Not on file  Financial Resource Strain: Low Risk (04/11/2024)   Overall Financial Resource Strain (CARDIA)    Difficulty of Paying Living Expenses: Not hard at all  Food Insecurity: No Food Insecurity (04/11/2024)   Epic    Worried About Radiation Protection Practitioner of Food in the Last Year: Never true    Ran Out of Food in the Last Year: Never true  Transportation Needs: No Transportation Needs (04/11/2024)   Epic    Lack of Transportation (Medical): No    Lack of Transportation (Non-Medical): No  Physical Activity: Sufficiently Active (04/11/2024)   Exercise Vital Sign    Days of Exercise per Week: 5 days    Minutes of Exercise per Session: 30 min  Stress: Stress Concern Present (04/11/2024)   [employer] of Occupational Health - Occupational Stress Questionnaire    Feeling of Stress: To some extent  Social Connections: Moderately Integrated (04/11/2024)   Social Connection and Isolation Panel    Frequency of Communication with Friends and Family: Once a week    Frequency of Social Gatherings with Friends and Family: Once a week    Attends Religious Services: More than 4 times per year    Active Member of Clubs or Organizations: Yes    Attends Banker Meetings: More than 4 times per year    Marital Status: Married  Catering Manager Violence: Not At Risk (10/05/2023)   Epic    Fear of Current or Ex-Partner: No    Emotionally Abused: No    Physically Abused: No    Sexually Abused: No  Depression (PHQ2-9): Low Risk (04/15/2024)   Depression (PHQ2-9)    PHQ-2 Score: 0  Alcohol Screen: Low Risk (10/05/2023)   Alcohol Screen    Last Alcohol Screening  Score (AUDIT): 0  Housing: Low Risk (04/11/2024)   Epic    Unable to Pay for Housing in the Last Year: No    Number of Times Moved in the Last Year: 0    Homeless in the Last Year: No  Utilities: Not At Risk (10/02/2022)   AHC Utilities    Threatened with loss of utilities: No  Health Literacy: Adequate Health Literacy (10/05/2023)   B1300 Health Literacy    Frequency of need for help with medical instructions: Never   Family Status  Relation Name Status   Mother Audwin Semper Deceased   Father Winfred  Nelwyn Deceased   Mat Uncle Gorrell Highfill Deceased   MGM  Deceased   MGF  Deceased   PGM  Deceased   Other nephew Alive   Sister Khalil Szczepanik Alive   Sister [facility] Alive  No partnership data on file   Family History  Problem Relation Age of Onset   Diabetes Mother    Hypertension Mother    Heart disease Mother    Stroke Father    Hypertension Father    Colon cancer Maternal Uncle 19   Cancer Maternal Uncle    Colon cancer Other 20   Diabetes Sister    Diabetes Sister    Allergies[2]    Patient Care Team: Madysun Thall, Rock HERO, FNP as PCP - General (Family Medicine) [facility] Watt Rush, MD as Attending Physician (Urology) Elnor Rome BROCKS, MD as Referring Physician (Dermatology)   Show/hide medication list[3]  ROS per HPI     Objective:     BP 126/74   Pulse 62   Temp (!) 96.6 F (35.9 C)   Ht 5' 10 (1.778 m)   Wt 212 lb 12.8 oz (96.5 kg)   SpO2 99%   BMI 30.53 kg/m  BP Readings from Last 3 Encounters:  04/15/24 126/74  10/05/23 126/70  04/30/23 (!) 148/76   Wt Readings from Last 3 Encounters:  04/15/24 212 lb 12.8 oz (96.5 kg)  10/05/23 202 lb (91.6 kg)  03/25/23 202 lb 9.6 oz (91.9 kg)   SpO2 Readings from Last 3 Encounters:  04/15/24 99%  03/25/23 97%  09/23/22 99%      Physical Exam Vitals and nursing note reviewed.  Constitutional:      General: He is not in acute distress.    Appearance: Normal  appearance. He is well-developed and well-groomed. He is obese. He is not ill-appearing, toxic-appearing or diaphoretic.  HENT:     Head: Normocephalic and atraumatic.     Jaw: There is normal jaw occlusion.     Right Ear: Hearing, tympanic membrane, ear canal and external ear normal.     Left Ear: Hearing, tympanic membrane, ear canal and external ear normal.     Nose: Nose normal.     Mouth/Throat:     Lips: Pink.     Mouth: Mucous membranes are moist.     Pharynx: Oropharynx is clear. Uvula midline.  Eyes:     General: Lids are normal.     Extraocular Movements: Extraocular movements intact.     Conjunctiva/sclera: Conjunctivae normal.     Pupils: Pupils are equal, round, and reactive to light.  Neck:     Thyroid : No thyroid  mass, thyromegaly or thyroid  tenderness.     Vascular: No carotid bruit or JVD.     Trachea: Trachea and phonation normal.   Cardiovascular:     Rate and Rhythm: Normal rate and regular rhythm.     Chest Wall: PMI is not displaced.     Pulses:          Dorsalis pedis pulses are 2+ on the right side and 2+ on the left side.       Posterior tibial pulses are 2+ on the right side and 2+ on the left side.     Heart sounds: Normal heart sounds. No murmur heard.    No friction rub. No gallop.  Pulmonary:     Effort: Pulmonary effort is normal. No respiratory distress.     Breath sounds: Normal breath sounds. No wheezing.  Chest:  Chest wall: No mass.  Breasts:    Breasts are symmetrical.  Abdominal:     General: Abdomen is flat. Bowel sounds are normal.     Palpations: Abdomen is soft.     Hernia: No hernia is present.  Musculoskeletal:        General: Normal range of motion.     Cervical back: Full passive range of motion without pain, normal range of motion and neck supple.     Right lower leg: No edema.     Left lower leg: No edema.  Feet:     Right foot:     Skin integrity: Skin integrity normal.     Toenail Condition: Right toenails are  normal.     Left foot:     Skin integrity: Skin integrity normal.     Toenail Condition: Left toenails are normal.  Lymphadenopathy:     Cervical: No cervical adenopathy.     Upper Body:     Right upper body: No supraclavicular, axillary or pectoral adenopathy.     Left upper body: No supraclavicular, axillary or pectoral adenopathy.  Skin:    General: Skin is warm and dry.     Capillary Refill: Capillary refill takes less than 2 seconds.     Coloration: Skin is not cyanotic, jaundiced or pale.     Findings: No rash.  Neurological:     General: No focal deficit present.     Mental Status: He is alert and oriented to person, place, and time.     Sensory: Sensation is intact.     Motor: Motor function is intact.     Coordination: Coordination is intact.     Gait: Gait is intact.     Deep Tendon Reflexes: Reflexes are normal and symmetric.  Psychiatric:        Attention and Perception: Attention and perception normal.        Mood and Affect: Mood and affect normal.        Speech: Speech normal.        Behavior: Behavior normal. Behavior is cooperative.        Thought Content: Thought content normal.        Cognition and Memory: Cognition and memory normal.        Judgment: Judgment normal.       Last CBC Lab Results  Component Value Date   WBC 4.4 03/25/2023   HGB 13.3 03/25/2023   HCT 40.4 03/25/2023   MCV 94 03/25/2023   MCH 31.0 03/25/2023   RDW 11.8 03/25/2023   PLT 287 03/25/2023   Last metabolic panel Lab Results  Component Value Date   GLUCOSE 101 (H) 03/25/2023   NA 144 03/25/2023   K 4.7 03/25/2023   CL 108 (H) 03/25/2023   CO2 24 03/25/2023   BUN 21 03/25/2023   CREATININE 1.18 03/25/2023   EGFR 65 03/25/2023   CALCIUM  9.3 03/25/2023   PROT 6.0 03/25/2023   ALBUMIN 3.7 (L) 03/25/2023   LABGLOB 2.3 03/25/2023   AGRATIO 1.7 09/23/2022   BILITOT 0.3 03/25/2023   ALKPHOS 71 03/25/2023   AST 28 03/25/2023   ALT 28 03/25/2023   Last lipids Lab  Results  Component Value Date   CHOL 136 03/25/2023   HDL 55 03/25/2023   LDLCALC 70 03/25/2023   TRIG 46 03/25/2023   CHOLHDL 2.5 03/25/2023   Last hemoglobin A1c Lab Results  Component Value Date   HGBA1C 5.8 (H) 07/04/2021   Last thyroid  functions  Lab Results  Component Value Date   TSH 1.300 03/25/2023   T4TOTAL 8.4 03/25/2023   FREET4 1.61 07/04/2021   Last vitamin D  Lab Results  Component Value Date   VD25OH 66.5 03/25/2023       Assessment & Plan:    Routine Health Maintenance and Physical Exam  Immunization History  Administered Date(s) Administered   Fluad Quad(high Dose 65+) 01/26/2019, 01/20/2020, 03/05/2021, 01/09/2022   INFLUENZA, HIGH DOSE SEASONAL PF 01/06/2017, 01/27/2018   Influenza Split 01/15/2016   Influenza Whole 01/12/2009   Influenza-Unspecified 01/12/2014, 02/22/2015, 03/20/2023, 01/22/2024   Moderna Covid-19 Fall Seasonal Vaccine 31yrs & older 01/22/2024   Moderna Covid-19 Vaccine Bivalent Booster 48yrs & up 01/23/2021   Moderna Sars-Covid-2 Vaccination 05/10/2019, 06/07/2019, 02/15/2020, 08/15/2020   Pneumococcal Conjugate-13 05/12/2014   Pneumococcal Polysaccharide-23 06/12/1996, 06/18/2016   Td 02/12/2005, 01/27/2018   Tdap 01/10/2010   Zoster Recombinant(Shingrix) 10/02/2020, 04/02/2021   Zoster, Live 07/26/2009    Health Maintenance  Topic Date Due   Medicare Annual Wellness (AWV)  10/04/2024   Colonoscopy  01/01/2025   DTaP/Tdap/Td (4 - Td or Tdap) 01/28/2028   Pneumococcal Vaccine: 50+ Years  Completed   Influenza Vaccine  Completed   Hepatitis C Screening  Completed   Zoster Vaccines- Shingrix  Completed   Meningococcal B Vaccine  Aged Out   COLON CANCER SCREENING ANNUAL FOBT  Discontinued   COVID-19 Vaccine  Discontinued    Discussed health benefits of physical activity, and encouraged him to engage in regular exercise appropriate for his age and condition.  Problem List Items Addressed This Visit        Cardiovascular and Mediastinum   Essential hypertension   Relevant Orders   CBC with Differential/Platelet   CMP14+EGFR   Lipid panel   TSH   T4, free     Endocrine   Hypothyroidism   Relevant Orders   Lipid panel   TSH   T4, free     Genitourinary   BPH with urinary obstruction     Other   Hyperlipidemia   Relevant Orders   CMP14+EGFR   Lipid panel   Vitamin D  deficiency   Relevant Orders   CBC with Differential/Platelet   CMP14+EGFR   Vitamin D , 25-hydroxy   Other Visit Diagnoses       Annual physical exam    -  Primary   Relevant Orders   CBC with Differential/Platelet   CMP14+EGFR   Lipid panel     Screening for colorectal cancer       Relevant Orders   Ambulatory referral to Gastroenterology     BMI 30.0-30.9,adult       Relevant Orders   CBC with Differential/Platelet   CMP14+EGFR   Lipid panel   TSH   T4, free   Vitamin D , 25-hydroxy   Bayer DCA Hb A1c Waived         Benign prostatic hyperplasia with lower urinary tract symptoms Chronic benign prostatic hyperplasia with nocturia 2-3 times per night. No current urinary tract issues. No medication prescribed by urologist. - Ordered PSA test to monitor for any upward trend  Postprocedural hypothyroidism Managed with levothyroxine . No significant symptoms of fatigue, hair, skin, or nail changes. Previous concerns about prescription potency were self-assessed as not significant. - Ordered TSH and free T4 tests to assess current thyroid  function  Essential hypertension Well-controlled with current medication regimen. Blood pressure readings are stable. No issues reported with current antihypertensive medication. - Encouraged regular home blood pressure monitoring  Mixed  hyperlipidemia Managed with current medication. No reported muscle aches or pains associated with cholesterol medication. Previous arthritic pain noted but not currently active. - Continue current cholesterol medication  regimen  Vitamin D  deficiency Managed with daily supplementation. No issues reported with current regimen. - Continue daily vitamin D  supplementation  Obesity BMI of 30. Previous weight loss of 30 pounds achieved through dietary changes and exercise. Recent weight gain of 20 pounds over the last two years. Current weight management strategies include portion control and dietary modifications. - Encouraged continuation of dietary modifications and increased physical activity  General Health Maintenance Routine health maintenance discussed. Colonoscopy due in August 2026. Regular dental and eye exams maintained. No abnormal bleeding or bowel movement changes reported. - Placed referral for colonoscopy with Dr. Micheal practice - Requested eye exam report from Dr. Jens for chart documentation       Return in about 1 year (around 04/15/2025) for Annual Physical.     Rosaline Bruns, FNP      [1]  Social History Tobacco Use   Smoking status: Never   Smokeless tobacco: Never  Vaping Use   Vaping status: Never Used  Substance Use Topics   Alcohol use: Not Currently    Comment: rare   Drug use: No  [2]  Allergies Allergen Reactions   Mobic  [Meloxicam ] Other (See Comments)    Rectal bleeding with only 2 pills   [3]  Outpatient Medications Prior to Visit  Medication Sig   aspirin 81 MG EC tablet Take 81 mg by mouth daily.     atorvastatin  (LIPITOR) 20 MG tablet TAKE 1 TABLET BY MOUTH EVERY DAY   benazepril  (LOTENSIN ) 20 MG tablet TAKE 1 TABLET BY MOUTH EVERY DAY   Calcium  Carbonate-Vitamin D  (CALCIUM  + D PO) Take 1 capsule by mouth daily.     Cholecalciferol (VITAMIN D3) 5000 UNITS CAPS Take 1 capsule by mouth daily.     DENTAGEL 1.1 % GEL dental gel PLEASE SEE ATTACHED FOR DETAILED DIRECTIONS   fluticasone  (FLONASE ) 50 MCG/ACT nasal spray INSTILL 1 SPRAY INTO BOTH NOSTRILS DAILY   levothyroxine  (SYNTHROID ) 75 MCG tablet TAKE 1 TABLET BY MOUTH EVERY DAY   Omega-3 Fatty  Acids (FISH OIL) 1000 MG CAPS Take 1 capsule by mouth 2 (two) times daily.     No facility-administered medications prior to visit.   "

## 2024-04-16 LAB — LIPID PANEL
Chol/HDL Ratio: 2.4 ratio (ref 0.0–5.0)
Cholesterol, Total: 141 mg/dL (ref 100–199)
HDL: 59 mg/dL
LDL Chol Calc (NIH): 70 mg/dL (ref 0–99)
Triglycerides: 57 mg/dL (ref 0–149)
VLDL Cholesterol Cal: 12 mg/dL (ref 5–40)

## 2024-04-16 LAB — CBC WITH DIFFERENTIAL/PLATELET
Basophils Absolute: 0.1 x10E3/uL (ref 0.0–0.2)
Basos: 1 %
EOS (ABSOLUTE): 0.2 x10E3/uL (ref 0.0–0.4)
Eos: 4 %
Hematocrit: 42.9 % (ref 37.5–51.0)
Hemoglobin: 14.2 g/dL (ref 13.0–17.7)
Immature Grans (Abs): 0.1 x10E3/uL (ref 0.0–0.1)
Immature Granulocytes: 1 %
Lymphocytes Absolute: 1.7 x10E3/uL (ref 0.7–3.1)
Lymphs: 33 %
MCH: 32.1 pg (ref 26.6–33.0)
MCHC: 33.1 g/dL (ref 31.5–35.7)
MCV: 97 fL (ref 79–97)
Monocytes Absolute: 0.6 x10E3/uL (ref 0.1–0.9)
Monocytes: 12 %
Neutrophils Absolute: 2.6 x10E3/uL (ref 1.4–7.0)
Neutrophils: 49 %
Platelets: 302 x10E3/uL (ref 150–450)
RBC: 4.43 x10E6/uL (ref 4.14–5.80)
RDW: 11.8 % (ref 11.6–15.4)
WBC: 5.2 x10E3/uL (ref 3.4–10.8)

## 2024-04-16 LAB — CMP14+EGFR
ALT: 33 IU/L (ref 0–44)
AST: 28 IU/L (ref 0–40)
Albumin: 3.7 g/dL — ABNORMAL LOW (ref 3.8–4.8)
Alkaline Phosphatase: 63 IU/L (ref 47–123)
BUN/Creatinine Ratio: 18 (ref 10–24)
BUN: 21 mg/dL (ref 8–27)
Bilirubin Total: 0.6 mg/dL (ref 0.0–1.2)
CO2: 24 mmol/L (ref 20–29)
Calcium: 9.1 mg/dL (ref 8.6–10.2)
Chloride: 107 mmol/L — ABNORMAL HIGH (ref 96–106)
Creatinine, Ser: 1.2 mg/dL (ref 0.76–1.27)
Globulin, Total: 2.2 g/dL (ref 1.5–4.5)
Glucose: 100 mg/dL — ABNORMAL HIGH (ref 70–99)
Potassium: 4.2 mmol/L (ref 3.5–5.2)
Sodium: 142 mmol/L (ref 134–144)
Total Protein: 5.9 g/dL — ABNORMAL LOW (ref 6.0–8.5)
eGFR: 63 mL/min/1.73

## 2024-04-16 LAB — PSA, TOTAL AND FREE
PSA, Free Pct: 22 %
PSA, Free: 0.97 ng/mL
Prostate Specific Ag, Serum: 4.4 ng/mL — ABNORMAL HIGH (ref 0.0–4.0)

## 2024-04-16 LAB — TSH: TSH: 0.992 u[IU]/mL (ref 0.450–4.500)

## 2024-04-16 LAB — VITAMIN D 25 HYDROXY (VIT D DEFICIENCY, FRACTURES): Vit D, 25-Hydroxy: 62.9 ng/mL (ref 30.0–100.0)

## 2024-04-16 LAB — T4, FREE: Free T4: 1.56 ng/dL (ref 0.82–1.77)

## 2024-10-05 ENCOUNTER — Ambulatory Visit: Payer: Self-pay

## 2025-04-21 ENCOUNTER — Encounter: Admitting: Family Medicine
